# Patient Record
Sex: Female | Born: 1975
Health system: Southern US, Community
[De-identification: ages and names within clinical notes are randomized; demographics above are authoritative.]

## PROBLEM LIST (undated history)

## (undated) DIAGNOSIS — R011 Cardiac murmur, unspecified: Secondary | ICD-10-CM

## (undated) DIAGNOSIS — M1990 Unspecified osteoarthritis, unspecified site: Secondary | ICD-10-CM

## (undated) DIAGNOSIS — F32A Depression, unspecified: Secondary | ICD-10-CM

## (undated) DIAGNOSIS — D649 Anemia, unspecified: Secondary | ICD-10-CM

## (undated) DIAGNOSIS — F1010 Alcohol abuse, uncomplicated: Secondary | ICD-10-CM

## (undated) DIAGNOSIS — E039 Hypothyroidism, unspecified: Secondary | ICD-10-CM

## (undated) DIAGNOSIS — K219 Gastro-esophageal reflux disease without esophagitis: Secondary | ICD-10-CM

## (undated) DIAGNOSIS — F319 Bipolar disorder, unspecified: Secondary | ICD-10-CM

## (undated) DIAGNOSIS — I10 Essential (primary) hypertension: Secondary | ICD-10-CM

## (undated) DIAGNOSIS — E559 Vitamin D deficiency, unspecified: Secondary | ICD-10-CM

## (undated) DIAGNOSIS — M797 Fibromyalgia: Secondary | ICD-10-CM

## (undated) DIAGNOSIS — IMO0002 Reserved for concepts with insufficient information to code with codable children: Secondary | ICD-10-CM

## (undated) DIAGNOSIS — J45909 Unspecified asthma, uncomplicated: Secondary | ICD-10-CM

## (undated) DIAGNOSIS — F419 Anxiety disorder, unspecified: Secondary | ICD-10-CM

## (undated) DIAGNOSIS — T7840XA Allergy, unspecified, initial encounter: Secondary | ICD-10-CM

## (undated) DIAGNOSIS — E669 Obesity, unspecified: Secondary | ICD-10-CM

## (undated) DIAGNOSIS — R7303 Prediabetes: Secondary | ICD-10-CM

## (undated) DIAGNOSIS — E119 Type 2 diabetes mellitus without complications: Secondary | ICD-10-CM

## (undated) DIAGNOSIS — K5900 Constipation, unspecified: Secondary | ICD-10-CM

## (undated) DIAGNOSIS — E079 Disorder of thyroid, unspecified: Secondary | ICD-10-CM

## (undated) DIAGNOSIS — F329 Major depressive disorder, single episode, unspecified: Secondary | ICD-10-CM

## (undated) HISTORY — DX: Disorder of thyroid, unspecified: E07.9

## (undated) HISTORY — DX: Allergy, unspecified, initial encounter: T78.40XA

## (undated) HISTORY — DX: Anxiety disorder, unspecified: F41.9

## (undated) HISTORY — DX: Anemia, unspecified: D64.9

## (undated) HISTORY — DX: Constipation, unspecified: K59.00

## (undated) HISTORY — DX: Alcohol abuse, uncomplicated: F10.10

## (undated) HISTORY — DX: Reserved for concepts with insufficient information to code with codable children: IMO0002

## (undated) HISTORY — PX: CARDIAC CATHETERIZATION: SHX172

## (undated) HISTORY — DX: Prediabetes: R73.03

## (undated) HISTORY — PX: TUBAL LIGATION: SHX77

## (undated) HISTORY — DX: Depression, unspecified: F32.A

## (undated) HISTORY — DX: Unspecified osteoarthritis, unspecified site: M19.90

## (undated) HISTORY — DX: Obesity, unspecified: E66.9

## (undated) HISTORY — DX: Vitamin D deficiency, unspecified: E55.9

## (undated) HISTORY — DX: Bipolar disorder, unspecified: F31.9

## (undated) HISTORY — DX: Fibromyalgia: M79.7

## (undated) HISTORY — DX: Essential (primary) hypertension: I10

## (undated) HISTORY — DX: Hypothyroidism, unspecified: E03.9

## (undated) HISTORY — DX: Cardiac murmur, unspecified: R01.1

---
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
## 2008-01-27 ENCOUNTER — Emergency Department (HOSPITAL_BASED_OUTPATIENT_CLINIC_OR_DEPARTMENT_OTHER): Admission: EM | Admit: 2008-01-27 | Discharge: 2008-01-27 | Payer: Self-pay | Admitting: Emergency Medicine

## 2008-10-11 ENCOUNTER — Encounter: Payer: Self-pay | Admitting: Emergency Medicine

## 2008-10-11 ENCOUNTER — Ambulatory Visit: Payer: Self-pay | Admitting: Diagnostic Radiology

## 2008-10-11 ENCOUNTER — Inpatient Hospital Stay (HOSPITAL_COMMUNITY): Admission: EM | Admit: 2008-10-11 | Discharge: 2008-10-12 | Payer: Self-pay | Admitting: Cardiology

## 2008-10-12 ENCOUNTER — Encounter (INDEPENDENT_AMBULATORY_CARE_PROVIDER_SITE_OTHER): Payer: Self-pay | Admitting: Cardiology

## 2009-03-16 ENCOUNTER — Ambulatory Visit: Payer: Self-pay | Admitting: Radiology

## 2009-03-16 ENCOUNTER — Emergency Department (HOSPITAL_BASED_OUTPATIENT_CLINIC_OR_DEPARTMENT_OTHER): Admission: EM | Admit: 2009-03-16 | Discharge: 2009-03-16 | Payer: Self-pay | Admitting: Emergency Medicine

## 2009-06-12 ENCOUNTER — Emergency Department (HOSPITAL_BASED_OUTPATIENT_CLINIC_OR_DEPARTMENT_OTHER): Admission: EM | Admit: 2009-06-12 | Discharge: 2009-06-12 | Payer: Self-pay | Admitting: Emergency Medicine

## 2010-08-02 LAB — PREGNANCY, URINE: Preg Test, Ur: NEGATIVE

## 2010-08-07 LAB — DIFFERENTIAL
Basophils Absolute: 0 10*3/uL (ref 0.0–0.1)
Basophils Relative: 1 % (ref 0–1)
Eosinophils Absolute: 0.3 10*3/uL (ref 0.0–0.7)
Eosinophils Relative: 6 % — ABNORMAL HIGH (ref 0–5)
Lymphocytes Relative: 41 % (ref 12–46)
Lymphs Abs: 1.8 10*3/uL (ref 0.7–4.0)
Monocytes Absolute: 0.4 10*3/uL (ref 0.1–1.0)
Monocytes Relative: 10 % (ref 3–12)
Neutro Abs: 1.8 10*3/uL (ref 1.7–7.7)
Neutrophils Relative %: 43 % (ref 43–77)

## 2010-08-07 LAB — COMPREHENSIVE METABOLIC PANEL
ALT: 10 U/L (ref 0–35)
AST: 18 U/L (ref 0–37)
Albumin: 3.2 g/dL — ABNORMAL LOW (ref 3.5–5.2)
Alkaline Phosphatase: 53 U/L (ref 39–117)
BUN: 6 mg/dL (ref 6–23)
CO2: 25 mEq/L (ref 19–32)
Calcium: 8.7 mg/dL (ref 8.4–10.5)
Chloride: 110 mEq/L (ref 96–112)
Creatinine, Ser: 0.71 mg/dL (ref 0.4–1.2)
GFR calc Af Amer: >60 mL/min (ref >60)
GFR calc non Af Amer: >60 mL/min (ref >60)
Glucose, Bld: 101 mg/dL — ABNORMAL HIGH (ref 70–99)
Potassium: 4 mEq/L (ref 3.5–5.1)
Sodium: 140 mEq/L (ref 135–145)
Total Bilirubin: 0.2 mg/dL — ABNORMAL LOW (ref 0.3–1.2)
Total Protein: 6.4 g/dL (ref 6.0–8.3)

## 2010-08-07 LAB — BASIC METABOLIC PANEL
BUN: 10 mg/dL (ref 6–23)
BUN: 8 mg/dL (ref 6–23)
CO2: 27 mEq/L (ref 19–32)
CO2: 26 mEq/L (ref 19–32)
Calcium: 8.8 mg/dL (ref 8.4–10.5)
Calcium: 8.5 mg/dL (ref 8.4–10.5)
Chloride: 107 mEq/L (ref 96–112)
Chloride: 110 mEq/L (ref 96–112)
Creatinine, Ser: 0.8 mg/dL (ref 0.4–1.2)
Creatinine, Ser: 0.66 mg/dL (ref 0.4–1.2)
GFR calc Af Amer: >60 mL/min (ref >60)
GFR calc Af Amer: >60 mL/min (ref >60)
GFR calc non Af Amer: >60 mL/min (ref >60)
GFR calc non Af Amer: >60 mL/min (ref >60)
Glucose, Bld: 94 mg/dL (ref 70–99)
Glucose, Bld: 105 mg/dL — ABNORMAL HIGH (ref 70–99)
Potassium: 4 mEq/L (ref 3.5–5.1)
Potassium: 4 mEq/L (ref 3.5–5.1)
Sodium: 143 mEq/L (ref 135–145)
Sodium: 140 mEq/L (ref 135–145)

## 2010-08-07 LAB — PROTIME-INR
INR: 1 (ref 0.00–1.49)
Prothrombin Time: 13.4 seconds (ref 11.6–15.2)

## 2010-08-07 LAB — CBC
HCT: 29.7 % — ABNORMAL LOW (ref 36.0–46.0)
HCT: 27.3 % — ABNORMAL LOW (ref 36.0–46.0)
Hemoglobin: 9.7 g/dL — ABNORMAL LOW (ref 12.0–15.0)
Hemoglobin: 9.2 g/dL — ABNORMAL LOW (ref 12.0–15.0)
MCHC: 32.7 g/dL (ref 30.0–36.0)
MCHC: 33.7 g/dL (ref 30.0–36.0)
MCV: 83.8 fL (ref 78.0–100.0)
MCV: 82.8 fL (ref 78.0–100.0)
Platelets: 324 10*3/uL (ref 150–400)
Platelets: 279 10*3/uL (ref 150–400)
RBC: 3.54 MIL/uL — ABNORMAL LOW (ref 3.87–5.11)
RBC: 3.3 MIL/uL — ABNORMAL LOW (ref 3.87–5.11)
RDW: 13.1 % (ref 11.5–15.5)
RDW: 13.6 % (ref 11.5–15.5)
WBC: 4.3 10*3/uL (ref 4.0–10.5)
WBC: 4.1 10*3/uL (ref 4.0–10.5)

## 2010-08-07 LAB — LIPID PANEL
Cholesterol: 156 mg/dL (ref 0–200)
HDL: 29 mg/dL — ABNORMAL LOW (ref >39)
LDL Cholesterol: 107 mg/dL — ABNORMAL HIGH (ref 0–99)
Total CHOL/HDL Ratio: 5.4 RATIO
Triglycerides: 101 mg/dL (ref <150)
VLDL: 20 mg/dL (ref 0–40)

## 2010-08-07 LAB — D-DIMER, QUANTITATIVE (NOT AT ARMC): D-Dimer, Quant: 0.28 ug/mL-FEU (ref 0.00–0.48)

## 2010-08-07 LAB — HEMOGLOBIN AND HEMATOCRIT, BLOOD
HCT: 29 % — ABNORMAL LOW (ref 36.0–46.0)
Hemoglobin: 9.6 g/dL — ABNORMAL LOW (ref 12.0–15.0)

## 2010-08-07 LAB — PREGNANCY, URINE: Preg Test, Ur: NEGATIVE

## 2010-08-07 LAB — POCT CARDIAC MARKERS
CKMB, poc: <1 ng/mL — ABNORMAL LOW (ref 1.0–8.0)
Myoglobin, poc: 39.2 ng/mL (ref 12–200)
Troponin i, poc: <0.05 ng/mL (ref 0.00–0.09)

## 2010-08-07 LAB — TSH: TSH: 1.892 u[IU]/mL (ref 0.350–4.500)

## 2010-08-07 LAB — HEMOGLOBIN A1C
Hgb A1c MFr Bld: 6.1 % (ref 4.6–6.1)
Mean Plasma Glucose: 128 mg/dL

## 2010-08-07 LAB — BRAIN NATRIURETIC PEPTIDE: Pro B Natriuretic peptide (BNP): 39 pg/mL (ref 0.0–100.0)

## 2010-08-07 LAB — MAGNESIUM: Magnesium: 1.9 mg/dL (ref 1.5–2.5)

## 2010-09-12 NOTE — Discharge Summary (Signed)
Tracy Valenzuela, Tracy Valenzuela NO.:  [id number]   MEDICAL RECORD NO.:  [id number]          PATIENT TYPE:  INP   LOCATION:  2502                         FACILITY:  [facility]   PHYSICIAN:  Cristy Hilts. Jacinto Halim, MD       DATE OF BIRTH:  06/11/1975   DATE OF ADMISSION:  10/11/2008  DATE OF DISCHARGE:  10/12/2008                               DISCHARGE SUMMARY   DISCHARGE DIAGNOSES:  1. Chest pain worrisome for unstable angina, catheterization this      admission revealing normal left ventricular function, normal      coronary arteries.  2. Bipolar disorder.  3. Abnormal EKG.   HOSPITAL COURSE:  The patient is a 35 year old female, who presented to  the emergency room in [place] at [facility] with chest pain and EKG changes  with T-wave inversion in lead III.  She also had some minimal ST  elevation in lead V3, and then she was treated initially as an STEMI.  She came to the ER [facility], and was evaluated, and taken to the cath lab  for urgent catheterization.  This revealed normal coronaries and normal  LV function.  There is no renal artery stenosis and no abdominal aortic  aneurysm.  D-dimer was within normal limits.  Echocardiogram has been  obtained and results are pending at the time of this dictation.  She was  discharged home on June 15 on Prilosec OTC 20 mg a day.   LABORATORIES:  White count 4.1, hemoglobin 9.2, hematocrit 27.3, and  platelets 279.  Sodium 140, potassium 4.0, BUN 8, and creatinine 0.6.  Liver functions are normal.  BNP was within normal limits.  Urine  pregnancy was negative.  TSH 1.89.  Lipid panel shows an LDL of 107, HDL  29, and total cholesterol of 156.  Hemoglobin A1c 6.1.  She will follow  up with Dr. Reche Dixon at [facility], she has appointment on July 16.   DISPOSITION:  The patient is discharged in stable condition.  She will  need follow up of her anemia, which will be done by her primary care  doctor.  Addendum to the above discharge summary, the patient  was seen  by [facility] during this admission.      Abelino Derrick, P.A.      Cristy Hilts. Jacinto Halim, MD     LKK/MEDQ  D:  11/04/2008  T:  11/05/2008  Job:  [id number]   cc:   Dineen Kid. Reche Dixon, M.D.

## 2010-09-12 NOTE — Cardiovascular Report (Signed)
[name], Valenzuela NO.:  [id number]   MEDICAL RECORD NO.:  [id number]          PATIENT TYPE:  INP   LOCATION:  2502                         FACILITY:  [facility]   PHYSICIAN:  Cristy Hilts. Jacinto Halim, MD       DATE OF BIRTH:  Jan 28, 1976   DATE OF PROCEDURE:  10/11/2008  DATE OF DISCHARGE:                            CARDIAC CATHETERIZATION   PROCEDURES PERFORMED:  1. Left ventriculography.  2. Selective right and left coronary arteriography.  3. Ascending aortogram.  4. Abdominal aortogram.  5. Right femoral arteriography and closure of the right femoral      arterial access with StarClose.   INDICATIONS:  [name] is a 35-year-old female with history of  bipolar disorder, morbid obesity who [facility].  She had initially complained  of 10/10 chest discomfort associated with marked shortness of breath.  Chest pain was relieved with sublingual nitroglycerin.  She has a strong  family history of premature coronary artery disease.  Given her morbid  obesity, abnormal EKG which suggested ST elevation in I and aVL with  inferior ST depression.  She was rushed to the cardiac catheterization  lab on an emergent basis for cardiac catheterization for coronary artery  disease evaluation.   The ascending aortogram and abdominal aortogram was performed to  evaluate for ascending aortic dissection and aneurysm given abnormal EKG  and chest pain.   HEMODYNAMIC DATA:  The left ventricular pressure was 122/4 with an end-  diastolic pressure of 17 mmHg.  The aortic pressure was 116/78 with a  mean of 97 milliseconds.  There was no significant pressure gradient  across the aortic valve.   ANGIOGRAPHIC DATA:  Left ventricle:  Left ventricular systolic function  was normal with an ejection fraction of 65% without any significant  mitral regurgitation.  No regional wall motion abnormality was noted.   Right coronary  artery:  Right coronary artery is a large-caliber  dominant vessel.  Gives origin to large PDA and large PLA.  It is smooth  and normal.   Left main coronary artery:  Left main coronary artery is a large-caliber  vessel, it is smooth and normal.   Circumflex coronary artery:  Circumflex coronary artery is a moderate-to-  caliber vessel which is smooth and normal.   Ramus intermediate:  Ramus intermediate is a large caliber vessel.  It  gives origin to several obtuse marginal branches.  It is smooth and  normal.   LAD:  LAD is a large caliber vessel.  It gives origin to large diagonal  I and a moderate-sized diagonal II.  It is smooth and normal.   Ascending aortogram:  Ascending aortogram revealed presence of 3 aortic  valve cusps without evidence of aortic regurgitation without evidence of  ascending aortic aneurysm or dissection.   Abdominal aortogram:  Abdominal aortogram revealed presence of 2 renal  arteries, one on either sides.  They were widely patent.  There was no  evidence of abdominal aortic aneurysm.  Aortoiliac bifurcation was  widely patent.  IMPRESSION:  Abnormal EKG probably related to either hypertension with  hypertensive heart disease or early repolarization and morbid obesity  contributing to this.  We will check a D-dimer on her.  If the D-dimer  is positive, she will have a CT angiography of the chest to exclude  pulmonary embolism given abnormal EKG, marked shortness of breath with  normal-looking chest x-ray.   Total of 110 mL of contrast was utilized for diagnostic angiography.   TECHNIQUE OF THE PROCEDURE:  Under usual sterile precautions, using a 6-  French right femoral arterial access, a 6-French multipurpose A2  catheter was advanced into the ascending aorta, then into the left  ventricle.  Left ventriculography was performed both in LAO and RAO  projection.  Catheter pulled into the ascending aorta.  Right coronary  artery was selectively  engaged and angiography was performed and then  left main coronary artery was selectively engaged and angiography was  performed.  Catheter was pulled into the root of the aorta and the  ascending aortogram was performed in the LAO projection, then catheter  was pulled back into the abdominal aorta, abdominal aortogram in the AP  projection.  Catheter was pulled out of the body.  Right femoral  arteriography was performed through the arterial access sheath and  access closed with StarClose with good hemostasis.  The patient  tolerated the procedure were.  There were no immediate complications.      Cristy Hilts. Jacinto Halim, MD     JRG/MEDQ  D:  10/11/2008  T:  10/12/2008  Job:  [id number]

## 2010-12-31 ENCOUNTER — Emergency Department (INDEPENDENT_AMBULATORY_CARE_PROVIDER_SITE_OTHER): Payer: BC Managed Care – PPO

## 2010-12-31 ENCOUNTER — Emergency Department (HOSPITAL_BASED_OUTPATIENT_CLINIC_OR_DEPARTMENT_OTHER)
Admission: EM | Admit: 2010-12-31 | Discharge: 2010-12-31 | Disposition: A | Discharge status: Home / Self Care | Payer: BC Managed Care – PPO | Attending: Emergency Medicine | Admitting: Emergency Medicine

## 2010-12-31 ENCOUNTER — Encounter: Payer: Self-pay | Admitting: *Deleted

## 2010-12-31 DIAGNOSIS — R51 Headache: Secondary | ICD-10-CM | POA: Insufficient documentation

## 2010-12-31 DIAGNOSIS — IMO0002 Reserved for concepts with insufficient information to code with codable children: Secondary | ICD-10-CM | POA: Insufficient documentation

## 2010-12-31 DIAGNOSIS — S60219A Contusion of unspecified wrist, initial encounter: Secondary | ICD-10-CM | POA: Insufficient documentation

## 2010-12-31 DIAGNOSIS — S0003XA Contusion of scalp, initial encounter: Secondary | ICD-10-CM | POA: Insufficient documentation

## 2010-12-31 DIAGNOSIS — S0093XA Contusion of unspecified part of head, initial encounter: Secondary | ICD-10-CM

## 2010-12-31 DIAGNOSIS — M25539 Pain in unspecified wrist: Secondary | ICD-10-CM

## 2010-12-31 MED ORDER — IBUPROFEN 600 MG PO TABS: 600.0000 mg | ORAL_TABLET | Freq: Four times a day (QID) | ORAL | Status: AC | PRN

## 2010-12-31 MED ORDER — HYDROCODONE-ACETAMINOPHEN 5-325 MG PO TABS
2.0000 | ORAL_TABLET | Freq: Once | ORAL | Status: AC
  Administered 2010-12-31: 2 via ORAL
  Filled 2010-12-31: qty 2

## 2010-12-31 MED ORDER — HYDROCODONE-ACETAMINOPHEN 5-500 MG PO TABS: 1.0000 | ORAL_TABLET | Freq: Four times a day (QID) | ORAL | Status: AC | PRN
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
## 2011-01-29 LAB — RAPID STREP SCREEN (MED CTR MEBANE ONLY): Streptococcus, Group A Screen (Direct): NEGATIVE

## 2011-03-21 ENCOUNTER — Emergency Department (HOSPITAL_BASED_OUTPATIENT_CLINIC_OR_DEPARTMENT_OTHER)
Admission: EM | Admit: 2011-03-21 | Discharge: 2011-03-22 | Disposition: A | Discharge status: Home / Self Care | Payer: BC Managed Care – PPO | Attending: Emergency Medicine | Admitting: Emergency Medicine

## 2011-03-21 ENCOUNTER — Encounter (HOSPITAL_BASED_OUTPATIENT_CLINIC_OR_DEPARTMENT_OTHER): Payer: Self-pay | Admitting: *Deleted

## 2011-03-21 DIAGNOSIS — R109 Unspecified abdominal pain: Secondary | ICD-10-CM | POA: Insufficient documentation

## 2011-03-21 DIAGNOSIS — N390 Urinary tract infection, site not specified: Secondary | ICD-10-CM | POA: Insufficient documentation

## 2011-03-21 LAB — URINALYSIS, ROUTINE W REFLEX MICROSCOPIC
Bilirubin Urine: NEGATIVE
Glucose, UA: NEGATIVE mg/dL
Ketones, ur: NEGATIVE mg/dL
Nitrite: NEGATIVE
Protein, ur: 30 mg/dL — AB
Specific Gravity, Urine: 1.027 (ref 1.005–1.030)
Urobilinogen, UA: 0.2 mg/dL (ref 0.0–1.0)
pH: 6 (ref 5.0–8.0)

## 2011-03-21 LAB — URINE MICROSCOPIC-ADD ON

## 2011-03-21 LAB — PREGNANCY, URINE: Preg Test, Ur: NEGATIVE

## 2011-03-21 MED ORDER — PHENAZOPYRIDINE HCL 100 MG PO TABS
ORAL_TABLET | ORAL | Status: AC
  Filled 2011-03-21: qty 2

## 2011-03-21 MED ORDER — PHENAZOPYRIDINE HCL 200 MG PO TABS: 200.0000 mg | ORAL_TABLET | Freq: Three times a day (TID) | ORAL | Status: AC

## 2011-03-21 MED ORDER — SULFAMETHOXAZOLE-TRIMETHOPRIM 800-160 MG PO TABS: 1.0000 | ORAL_TABLET | Freq: Two times a day (BID) | ORAL | Status: AC

## 2011-03-21 MED ORDER — PHENAZOPYRIDINE HCL 100 MG PO TABS
200.0000 mg | ORAL_TABLET | Freq: Once | ORAL | Status: AC
  Administered 2011-03-21: 200 mg via ORAL

## 2011-03-21 MED ORDER — SULFAMETHOXAZOLE-TMP DS 800-160 MG PO TABS
1.0000 | ORAL_TABLET | Freq: Once | ORAL | Status: AC
  Administered 2011-03-21: 1 via ORAL
  Filled 2011-03-21: qty 1

## 2011-03-21 NOTE — ED Notes (Signed)
C/o dysuria and hematuria since last night.

## 2011-03-21 NOTE — ED Notes (Signed)
Pt c/o lower abdominal pain radiating to her back and pink discharge that began last night at around midnight.

## 2011-03-22 NOTE — ED Provider Notes (Signed)
History     CSN: [id number] Arrival date & time: 03/21/2011  9:41 PM   First MD Initiated Contact with Patient 03/21/11 2314      Chief Complaint  Patient presents with  . Abdominal Pain    (Consider location/radiation/quality/duration/timing/severity/associated sxs/prior treatment) Patient is a 35 y.o. female presenting with abdominal pain. The history is provided by the patient.  Abdominal Pain The primary symptoms of the illness include abdominal pain and dysuria. The primary symptoms of the illness do not include shortness of breath, nausea, vomiting, vaginal discharge or vaginal bleeding. The current episode started 2 days ago. The onset of the illness was gradual. The problem has been gradually worsening.  The abdominal pain began 2 days ago. The pain came on gradually. The abdominal pain has been unchanged since its onset. The abdominal pain is located in the suprapubic region. The abdominal pain radiates to the left flank and right flank. The severity of the abdominal pain is 3/10. The abdominal pain is relieved by nothing. The abdominal pain is exacerbated by urination.  The dysuria began 2 days ago. The discomfort is moderate. She is not currently sexually active. The dysuria is associated with frequency and urgency. The dysuria is not associated with discharge or hematuria.  The patient states that she believes she is currently not pregnant. The patient has not had a change in bowel habit. Additional symptoms associated with the illness include urgency and frequency. Symptoms associated with the illness do not include hematuria.    History reviewed. No pertinent past medical history.  Past Surgical History  Procedure Date  . Tubal ligation   . Cardiac catheterization     No family history on file.  History  Substance Use Topics  . Smoking status: Never Smoker   . Smokeless tobacco: Not on file  . Alcohol Use: No    OB History    Grav Para Term Preterm Abortions TAB  SAB Ect Mult Living                  Review of Systems  Respiratory: Negative for shortness of breath.   Gastrointestinal: Positive for abdominal pain. Negative for nausea and vomiting.  Genitourinary: Positive for dysuria, urgency and frequency. Negative for hematuria, vaginal bleeding and vaginal discharge.  All other systems reviewed and are negative.    Allergies  Review of patient's allergies indicates no known allergies.  Home Medications   Current Outpatient Rx  Name Route Sig Dispense Refill  . PHENAZOPYRIDINE HCL 200 MG PO TABS Oral Take 1 tablet (200 mg total) by mouth 3 (three) times daily. 6 tablet 0  . SULFAMETHOXAZOLE-TRIMETHOPRIM 800-160 MG PO TABS Oral Take 1 tablet by mouth 2 (two) times daily. 14 tablet 0    BP 123/49  Pulse 87  Temp(Src) 98.5 F (36.9 C) (Oral)  Resp 20  Ht 5\' 8"  (1.727 m)  Wt 240 lb (108.863 kg)  BMI 36.49 kg/m2  SpO2 100%  LMP 03/12/2011  Physical Exam  Nursing note and vitals reviewed. Constitutional: She is oriented to person, place, and time. She appears well-developed and well-nourished. No distress.  HENT:  Head: Normocephalic and atraumatic.  Eyes: EOM are normal. Pupils are equal, round, and reactive to light.  Cardiovascular: Normal rate, regular rhythm, normal heart sounds and intact distal pulses.  Exam reveals no friction rub.   No murmur heard. Pulmonary/Chest: Effort normal and breath sounds normal. She has no wheezes. She has no rales.  Abdominal: Soft. Bowel sounds are normal.  She exhibits no distension. There is tenderness in the suprapubic area. There is CVA tenderness. There is no rebound and no guarding.  Musculoskeletal: Normal range of motion. She exhibits no tenderness.       No edema  Neurological: She is alert and oriented to person, place, and time. No cranial nerve deficit.  Skin: Skin is warm and dry. No rash noted.  Psychiatric: She has a normal mood and affect. Her behavior is normal.    ED Course    Procedures (including critical care time)  Labs Reviewed  URINALYSIS, ROUTINE W REFLEX MICROSCOPIC - Abnormal; Notable for the following:    Appearance CLOUDY (*)    Hgb urine dipstick LARGE (*)    Protein, ur 30 (*)    Leukocytes, UA LARGE (*)    All other components within normal limits  URINE MICROSCOPIC-ADD ON - Abnormal; Notable for the following:    Squamous Epithelial / LPF FEW (*)    Bacteria, UA FEW (*)    All other components within normal limits  PREGNANCY, URINE   No results found.   1. UTI (lower urinary tract infection)       MDM   Pt with dysuria and back pain. Denies N/V or fever.  No signs of pyelo.  UTI on UA and denies any vaginal d/c.  Will treat and d/c home.        Gwyneth Sprout, MD 03/22/11 [phone]

## 2011-04-09 ENCOUNTER — Encounter (HOSPITAL_BASED_OUTPATIENT_CLINIC_OR_DEPARTMENT_OTHER): Payer: Self-pay | Admitting: *Deleted

## 2011-04-09 ENCOUNTER — Emergency Department (HOSPITAL_BASED_OUTPATIENT_CLINIC_OR_DEPARTMENT_OTHER)
Admission: EM | Admit: 2011-04-09 | Discharge: 2011-04-09 | Disposition: A | Discharge status: Home / Self Care | Payer: BC Managed Care – PPO | Attending: Emergency Medicine | Admitting: Emergency Medicine

## 2011-04-09 DIAGNOSIS — J111 Influenza due to unidentified influenza virus with other respiratory manifestations: Secondary | ICD-10-CM

## 2011-04-09 DIAGNOSIS — B9789 Other viral agents as the cause of diseases classified elsewhere: Secondary | ICD-10-CM | POA: Insufficient documentation

## 2011-04-09 NOTE — ED Notes (Signed)
Cough runny nose sore throat fever since last night.

## 2011-04-09 NOTE — ED Provider Notes (Addendum)
History     CSN: [id number] Arrival date & time: 04/09/2011  5:36 PM   First MD Initiated Contact with Patient 04/09/11 1743      Chief Complaint  Patient presents with  . Sore Throat    (Consider location/radiation/quality/duration/timing/severity/associated sxs/prior treatment) HPI 35 y.o. Female here with ili for two days.  Children with same.  Patient with subjective fever, uri symptoms, muscle aches.  No vomiting or diarrhea.   History reviewed. No pertinent past medical history.  Past Surgical History  Procedure Date  . Tubal ligation   . Cardiac catheterization     No family history on file.  History  Substance Use Topics  . Smoking status: Never Smoker   . Smokeless tobacco: Not on file  . Alcohol Use: No    OB History    Grav Para Term Preterm Abortions TAB SAB Ect Mult Living                  Review of Systems  All other systems reviewed and are negative.    Allergies  Review of patient's allergies indicates no known allergies.  Home Medications  No current outpatient prescriptions on file.  BP 119/64  Pulse 78  Temp(Src) 97 F (36.1 C) (Oral)  Resp 20  SpO2 97%  LMP 03/12/2011  Physical Exam  Nursing note and vitals reviewed. Constitutional: She is oriented to person, place, and time. She appears well-developed and well-nourished.  HENT:  Head: Normocephalic and atraumatic.  Right Ear: External ear normal.  Left Ear: External ear normal.  Nose: Nose normal.  Mouth/Throat: Oropharynx is clear and moist.  Eyes: Conjunctivae and EOM are normal. Pupils are equal, round, and reactive to light.  Neck: Normal range of motion. Neck supple.  Cardiovascular: Normal rate, regular rhythm and normal heart sounds.   Pulmonary/Chest: Effort normal and breath sounds normal.  Abdominal: Soft. Bowel sounds are normal.  Musculoskeletal: Normal range of motion.  Neurological: She is alert and oriented to person, place, and time. She has normal  reflexes.  Skin: Skin is warm and dry.  Psychiatric: She has a normal mood and affect. Her behavior is normal. Judgment and thought content normal.    ED Course  Procedures (including critical care time)  Labs Reviewed - No data to display No results found.   No diagnosis found.    MDM          Hilario Quarry, MD 04/09/11 4098  Hilario Quarry, MD 04/09/11 [phone]

## 2011-11-08 ENCOUNTER — Encounter (HOSPITAL_BASED_OUTPATIENT_CLINIC_OR_DEPARTMENT_OTHER): Payer: Self-pay

## 2011-11-08 ENCOUNTER — Emergency Department (HOSPITAL_BASED_OUTPATIENT_CLINIC_OR_DEPARTMENT_OTHER)
Admission: EM | Admit: 2011-11-08 | Discharge: 2011-11-08 | Disposition: A | Discharge status: Home / Self Care | Payer: Self-pay | Attending: Emergency Medicine | Admitting: Emergency Medicine

## 2011-11-08 DIAGNOSIS — A599 Trichomoniasis, unspecified: Secondary | ICD-10-CM | POA: Insufficient documentation

## 2011-11-08 DIAGNOSIS — J45909 Unspecified asthma, uncomplicated: Secondary | ICD-10-CM | POA: Insufficient documentation

## 2011-11-08 DIAGNOSIS — L089 Local infection of the skin and subcutaneous tissue, unspecified: Secondary | ICD-10-CM | POA: Insufficient documentation

## 2011-11-08 HISTORY — DX: Unspecified asthma, uncomplicated: J45.909

## 2011-11-08 LAB — WET PREP, GENITAL: Yeast Wet Prep HPF POC: NONE SEEN

## 2011-11-08 MED ORDER — METRONIDAZOLE 500 MG PO TABS: 500.0000 mg | ORAL_TABLET | Freq: Two times a day (BID) | ORAL | Status: AC

## 2011-11-08 MED ORDER — DOXYCYCLINE HYCLATE 100 MG PO CAPS: 100.0000 mg | ORAL_CAPSULE | Freq: Two times a day (BID) | ORAL | Status: AC

## 2011-11-08 NOTE — ED Notes (Signed)
Pt reports a rash x 3 weeks and recently treated for scabies.

## 2011-11-08 NOTE — ED Provider Notes (Signed)
History     CSN: [id number]  Arrival date & time 11/08/11  1624   First MD Initiated Contact with Patient 11/08/11 1849      Chief Complaint  Patient presents with  . Rash    (Consider location/radiation/quality/duration/timing/severity/associated sxs/prior treatment) Patient is a 36 y.o. female presenting with vaginal discharge. The history is provided by the patient. No language interpreter was used.  Vaginal Discharge This is a new problem. The current episode started in the past 7 days. The problem occurs constantly. The problem has been gradually worsening. Associated symptoms include a rash. Nothing aggravates the symptoms. She has tried nothing for the symptoms. The treatment provided no relief.  Pt complains of a vaginal discharge and odor.  Pt reports she was treated for scabies 3 weeks ago. Pt complains of a continued skin rash.  Past Medical History  Diagnosis Date  . Asthma     Past Surgical History  Procedure Date  . Tubal ligation   . Cardiac catheterization     No family history on file.  History  Substance Use Topics  . Smoking status: Never Smoker   . Smokeless tobacco: Not on file  . Alcohol Use: No    OB History    Grav Para Term Preterm Abortions TAB SAB Ect Mult Living                  Review of Systems  Genitourinary: Positive for vaginal discharge.  Skin: Positive for rash.  All other systems reviewed and are negative.    Allergies  Review of patient's allergies indicates no known allergies.  Home Medications  No current outpatient prescriptions on file.  BP 137/68  Pulse 81  Temp 98 F (36.7 C) (Oral)  Resp 18  SpO2 100%  LMP 10/28/2011  Physical Exam  Nursing note and vitals reviewed. Constitutional: She is oriented to person, place, and time. She appears well-developed and well-nourished.  HENT:  Head: Normocephalic and atraumatic.  Right Ear: External ear normal.  Left Ear: External ear normal.  Nose: Nose normal.    Mouth/Throat: Oropharynx is clear and moist.  Eyes: Conjunctivae are normal. Pupils are equal, round, and reactive to light.  Neck: Normal range of motion. Neck supple.  Cardiovascular: Normal rate and normal heart sounds.   Pulmonary/Chest: Effort normal and breath sounds normal.  Abdominal: Soft.  Genitourinary: Vaginal discharge found.       Clear discharge, adnexa nontender,  No mass  Musculoskeletal: Normal range of motion.  Neurological: She is alert and oriented to person, place, and time. She has normal reflexes.  Skin: There is erythema.       Erythematous sores, looks infected   Psychiatric: She has a normal mood and affect.    ED Course  Procedures (including critical care time)  Labs Reviewed  WET PREP, GENITAL - Abnormal; Notable for the following:    Trich, Wet Prep MODERATE (*)     Clue Cells Wet Prep HPF POC FEW (*)     WBC, Wet Prep HPF POC MODERATE (*)     All other components within normal limits  GC/CHLAMYDIA PROBE AMP, GENITAL   No results found.   1. Trichomonas   2. Skin infection       MDM  Flagyl  And doxycycline for skin        Lonia Skinner [place], [place] 11/08/11 1941

## 2011-11-10 LAB — GC/CHLAMYDIA PROBE AMP, GENITAL
Chlamydia, DNA Probe: POSITIVE — AB
GC Probe Amp, Genital: POSITIVE — AB

## 2011-11-10 NOTE — ED Provider Notes (Signed)
Medical screening examination/treatment/procedure(s) were performed by non-physician practitioner and as supervising physician I was immediately available for consultation/collaboration.   Shelda Jakes, MD 11/10/11 [phone]

## 2011-11-11 NOTE — ED Notes (Signed)
+   Gonorrhea + Chlamydia Chart sent to EDP office for review. 

## 2011-11-14 NOTE — ED Notes (Signed)
Check with patient to see which is best : needs to return with rocephin/zithromax alternatively could be txed at home with rx for zithromax 2 po once (250 mg tabs x 8)but most people have a hard time tolerating this per Grant Fontana.

## 2011-11-15 NOTE — ED Notes (Signed)
Attempt made to contact patient-no answer 

## 2011-11-17 NOTE — ED Notes (Signed)
Left voicemail to call flow manager's #

## 2011-11-19 NOTE — ED Notes (Signed)
Unable to contact via phone.'Letter sent to [facility] address. 

## 2012-11-10 ENCOUNTER — Encounter (HOSPITAL_BASED_OUTPATIENT_CLINIC_OR_DEPARTMENT_OTHER): Payer: Self-pay | Admitting: Family Medicine

## 2012-11-10 ENCOUNTER — Emergency Department (HOSPITAL_BASED_OUTPATIENT_CLINIC_OR_DEPARTMENT_OTHER)
Admission: EM | Admit: 2012-11-10 | Discharge: 2012-11-10 | Disposition: A | Discharge status: Home / Self Care | Payer: Self-pay | Attending: Emergency Medicine | Admitting: Emergency Medicine

## 2012-11-10 ENCOUNTER — Emergency Department (HOSPITAL_BASED_OUTPATIENT_CLINIC_OR_DEPARTMENT_OTHER): Payer: Self-pay

## 2012-11-10 DIAGNOSIS — M25579 Pain in unspecified ankle and joints of unspecified foot: Secondary | ICD-10-CM | POA: Insufficient documentation

## 2012-11-10 DIAGNOSIS — J45909 Unspecified asthma, uncomplicated: Secondary | ICD-10-CM | POA: Insufficient documentation

## 2012-11-10 DIAGNOSIS — F17200 Nicotine dependence, unspecified, uncomplicated: Secondary | ICD-10-CM | POA: Insufficient documentation

## 2012-11-10 DIAGNOSIS — M25571 Pain in right ankle and joints of right foot: Secondary | ICD-10-CM

## 2012-11-10 MED ORDER — OXYCODONE-ACETAMINOPHEN 5-325 MG PO TABS
1.0000 | ORAL_TABLET | Freq: Once | ORAL | Status: AC
  Administered 2012-11-10: 1 via ORAL
  Filled 2012-11-10 (×2): qty 1

## 2012-11-10 MED ORDER — OXYCODONE-ACETAMINOPHEN 5-325 MG PO TABS: 1.0000 | ORAL_TABLET | Freq: Four times a day (QID) | ORAL | Status: DC | PRN

## 2012-11-10 NOTE — ED Notes (Addendum)
Pt c/o right ankle pain and sts she fell a "crack" today when standing on it. Pt denies injury. Pt reports pain subsides with rest. CMS intact, pt ambulating.

## 2012-11-10 NOTE — ED Notes (Signed)
MD at bedside. 

## 2012-11-10 NOTE — ED Provider Notes (Signed)
History     This chart was scribed for Gwyneth Sprout, MD by Jiles Prows, ED Scribe. The patient was seen in room MH03/MH03 and the patient's care was started at 5:44 PM.   CSN: [id number] Arrival date & time 11/10/12  1654    Chief Complaint  Patient presents with  . Ankle Pain   The history is provided by the patient and a friend. No language interpreter was used.   HPI Comments: Tracy Valenzuela is a 37 y.o. female who presents to the Emergency Department complaining of sudden, mild ankle pain onset today.  Pt reports she was getting up this morning and got up wrong.  She reports that she heard a "pop."  She claims that she has been dragging it since the incident when walking.  She states she did not roll her ankle, she just stood up on her right ankle and heard it crack with pain.  She reports issues with sciatica.  Pt denies headache, diaphoresis, fever, chills, nausea, vomiting, diarrhea, weakness, cough, SOB and any other pain.   Past Medical History  Diagnosis Date  . Asthma    Past Surgical History  Procedure Laterality Date  . Tubal ligation    . Cardiac catheterization     No family history on file. History  Substance Use Topics  . Smoking status: Current Some Day Smoker    Types: Cigarettes  . Smokeless tobacco: Not on file  . Alcohol Use: No   OB History   Grav Para Term Preterm Abortions TAB SAB Ect Mult Living                 Review of Systems A complete 10 system review of systems was obtained and all systems are negative except as noted in the HPI and PMH.   Allergies  Review of patient's allergies indicates no known allergies.  Home Medications  No current outpatient prescriptions on file. BP 139/62  Pulse 84  Temp(Src) 98.4 F (36.9 C) (Oral)  Resp 20  Ht 5\' 6"  (1.676 m)  Wt 240 lb (108.863 kg)  BMI 38.76 kg/m2  SpO2 100%  LMP 10/28/2012 Physical Exam  Nursing note and vitals reviewed. Constitutional: She is oriented to person, place, and  time. She appears well-developed and well-nourished. No distress.  HENT:  Head: Normocephalic and atraumatic.  Eyes: EOM are normal.  Cardiovascular: Normal rate and intact distal pulses.   Pulmonary/Chest: Effort normal.  Musculoskeletal: Normal range of motion.       Right ankle: She exhibits normal range of motion, no swelling, no ecchymosis, no deformity, no laceration and normal pulse. Tenderness. Medial malleolus tenderness found. No lateral malleolus, no AITFL, no CF ligament, no posterior TFL, no head of 5th metatarsal and no proximal fibula tenderness found. Achilles tendon normal.  Neurological: She is alert and oriented to person, place, and time. She has normal strength. No sensory deficit.  Skin: Skin is warm and dry.  Psychiatric: She has a normal mood and affect. Her behavior is normal.    ED Course  Procedures (including critical care time) DIAGNOSTIC STUDIES: Oxygen Saturation is 100% on RA, normal by my interpretation.    COORDINATION OF CARE: 5:47 PM - Discussed ED treatment with pt at bedside including x-ray and pain management and pt agrees.   Labs Reviewed - No data to display Dg Ankle Complete Right  11/10/2012   *RADIOLOGY REPORT*  Clinical Data: Medial pain after "crack" earlier today.  RIGHT ANKLE - COMPLETE 3+ VIEW  Comparison: None.  Findings: Negative for acute fracture or malalignment.  Tiny ossific density inferior to the medial malleolus appears chronic. No acute soft tissue abnormality.  IMPRESSION: Negative right ankle radiographs.   Original Report Authenticated By: Tiburcio Pea   No diagnosis found.  MDM   Patient with pain to the right ankle when standing today. She denies any fall or twisting of the ankle but has been unable to bear weight since she felt hot this morning. Notable swelling the pain over the medial malleolus.  X-rays negative however he did see a density inferior to medial malleolus that appears chronic. Patient placed in an ankle  air splint and put on crutches and pain medication. He'll follow up if symptoms do not improve in 1 week. Patient has good pulse and sensation in the foot and no fibular head pain. For suspicion of patient's pain is related to her sciatica.  I personally performed the services described in this documentation, which was scribed in my presence.  The recorded information has been reviewed and considered.    Gwyneth Sprout, MD 11/10/12 [phone]

## 2013-04-29 ENCOUNTER — Encounter (HOSPITAL_COMMUNITY): Payer: Self-pay | Admitting: Emergency Medicine

## 2013-04-29 ENCOUNTER — Emergency Department (HOSPITAL_COMMUNITY)
Admission: EM | Admit: 2013-04-29 | Discharge: 2013-04-30 | Disposition: A | Discharge status: Home / Self Care | Payer: BC Managed Care – PPO | Attending: Emergency Medicine | Admitting: Emergency Medicine

## 2013-04-29 DIAGNOSIS — Z9851 Tubal ligation status: Secondary | ICD-10-CM | POA: Insufficient documentation

## 2013-04-29 DIAGNOSIS — Z9889 Other specified postprocedural states: Secondary | ICD-10-CM | POA: Insufficient documentation

## 2013-04-29 DIAGNOSIS — R1084 Generalized abdominal pain: Secondary | ICD-10-CM | POA: Insufficient documentation

## 2013-04-29 DIAGNOSIS — N939 Abnormal uterine and vaginal bleeding, unspecified: Secondary | ICD-10-CM

## 2013-04-29 DIAGNOSIS — J45909 Unspecified asthma, uncomplicated: Secondary | ICD-10-CM | POA: Insufficient documentation

## 2013-04-29 DIAGNOSIS — Z862 Personal history of diseases of the blood and blood-forming organs and certain disorders involving the immune mechanism: Secondary | ICD-10-CM | POA: Insufficient documentation

## 2013-04-29 DIAGNOSIS — F17200 Nicotine dependence, unspecified, uncomplicated: Secondary | ICD-10-CM | POA: Insufficient documentation

## 2013-04-29 DIAGNOSIS — Z3202 Encounter for pregnancy test, result negative: Secondary | ICD-10-CM | POA: Insufficient documentation

## 2013-04-29 DIAGNOSIS — N898 Other specified noninflammatory disorders of vagina: Secondary | ICD-10-CM | POA: Insufficient documentation

## 2013-04-29 LAB — CBC WITH DIFFERENTIAL/PLATELET
Basophils Absolute: 0 10*3/uL (ref 0.0–0.1)
Basophils Relative: 1 % (ref 0–1)
Eosinophils Absolute: 0.1 10*3/uL (ref 0.0–0.7)
Eosinophils Relative: 3 % (ref 0–5)
HCT: 26.4 % — ABNORMAL LOW (ref 36.0–46.0)
Hemoglobin: 8 g/dL — ABNORMAL LOW (ref 12.0–15.0)
Lymphocytes Relative: 33 % (ref 12–46)
Lymphs Abs: 1.7 10*3/uL (ref 0.7–4.0)
MCH: 22.6 pg — ABNORMAL LOW (ref 26.0–34.0)
MCHC: 30.3 g/dL (ref 30.0–36.0)
MCV: 74.6 fL — ABNORMAL LOW (ref 78.0–100.0)
Monocytes Absolute: 0.3 10*3/uL (ref 0.1–1.0)
Monocytes Relative: 7 % (ref 3–12)
Neutro Abs: 3 10*3/uL (ref 1.7–7.7)
Neutrophils Relative %: 58 % (ref 43–77)
Platelets: 384 10*3/uL (ref 150–400)
RBC: 3.54 MIL/uL — ABNORMAL LOW (ref 3.87–5.11)
RDW: 15.9 % — ABNORMAL HIGH (ref 11.5–15.5)
WBC: 5.3 10*3/uL (ref 4.0–10.5)

## 2013-04-29 LAB — URINE MICROSCOPIC-ADD ON

## 2013-04-29 LAB — WET PREP, GENITAL
Clue Cells Wet Prep HPF POC: NONE SEEN
Trich, Wet Prep: NONE SEEN
Yeast Wet Prep HPF POC: NONE SEEN

## 2013-04-29 LAB — URINALYSIS, ROUTINE W REFLEX MICROSCOPIC
Bilirubin Urine: NEGATIVE
Glucose, UA: NEGATIVE mg/dL
Ketones, ur: NEGATIVE mg/dL
Nitrite: NEGATIVE
Protein, ur: 30 mg/dL — AB
Specific Gravity, Urine: 1.031 — ABNORMAL HIGH (ref 1.005–1.030)
Urobilinogen, UA: 1 mg/dL (ref 0.0–1.0)
pH: 6 (ref 5.0–8.0)

## 2013-04-29 LAB — POCT PREGNANCY, URINE: Preg Test, Ur: NEGATIVE

## 2013-04-29 MED ORDER — KETOROLAC TROMETHAMINE 30 MG/ML IJ SOLN
30.0000 mg | Freq: Once | INTRAMUSCULAR | Status: AC
  Administered 2013-04-29: 30 mg via INTRAMUSCULAR
  Filled 2013-04-29: qty 1

## 2013-04-29 NOTE — ED Notes (Addendum)
Pt c/o heavy vaginal bleeding, including clots, lower back pain, and abdominal cramping starting this morning.  Pt sts "I have never had bleeding like this before and it smells like something is dead.  My period was a week late."  Pain score 9/10.

## 2013-04-29 NOTE — ED Provider Notes (Signed)
CSN: [id number]     Arrival date & time 04/29/13  1504 History   First MD Initiated Contact with Patient 04/29/13 1719     Chief Complaint  Patient presents with  . Vaginal Bleeding  . Abdominal Cramping   (Consider location/radiation/quality/duration/timing/severity/associated sxs/prior Treatment) HPI Comments: Patient presents today with a chief complaint of vaginal bleeding.  She state that she has been having spotting for the past 3-4 days and then today bleeding became more heavy.  She states that today she has had to change a pad seven or eight times.  She reports that the pad is saturated when she changes it.  She has also noticed small blood clots today.  Blood is bright red.  She reports that her last normal menstrual period was five weeks ago.  She normally has a period every four weeks ago.  She reports that she normally has heavy periods, but this period is heavier than usual.  She is currently sexually active.  She denies abdominal pain, but reports that she has been having some lower abdominal cramping.  She denies fever, chills, nausea, vomiting, diarrhea, dizziness, lightheadedness, weakness, or SOB.  She is currently not on any type of contraceptive.  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  The history is provided by the patient.    Past Medical History  Diagnosis Date  . Asthma    Past Surgical History  Procedure Laterality Date  . Tubal ligation    . Cardiac catheterization     History reviewed. No pertinent family history. History  Substance Use Topics  . Smoking status: Current Some Day Smoker    Types: Cigarettes  . Smokeless tobacco: Never Used  . Alcohol Use: Yes   OB History   Grav Para Term Preterm Abortions TAB SAB Ect Mult Living                 Review of Systems  All other systems reviewed and are negative.    Allergies  Review of patient's allergies indicates no known allergies.  Home Medications  No current outpatient prescriptions on  file. BP 141/67  Pulse 63  Temp(Src) 97.8 F (36.6 C) (Oral)  SpO2 100%  LMP 04/29/2013 Physical Exam  Nursing note and vitals reviewed. Constitutional: She appears well-developed and well-nourished.  HENT:  Head: Normocephalic and atraumatic.  Mouth/Throat: Oropharynx is clear and moist.  Neck: Normal range of motion. Neck supple.  Cardiovascular: Normal rate, regular rhythm and normal heart sounds.   Pulmonary/Chest: Effort normal and breath sounds normal.  Abdominal: Soft. Bowel sounds are normal. She exhibits no distension and no mass. There is tenderness. There is no rebound and no guarding.  Mild generalized abdominal pain  Genitourinary: Cervix exhibits no motion tenderness. Right adnexum displays no mass, no tenderness and no fullness. Left adnexum displays no mass, no tenderness and no fullness.  Moderate blood visualized in the vaginal vault.  No blood clots.  Musculoskeletal: Normal range of motion.  Neurological: She is alert.  Skin: Skin is warm and dry.  Psychiatric: She has a normal mood and affect.    ED Course  Procedures (including critical care time) Labs Review Labs Reviewed  GC/CHLAMYDIA PROBE AMP  WET PREP, GENITAL  URINALYSIS, ROUTINE W REFLEX MICROSCOPIC  CBC WITH DIFFERENTIAL   Imaging Review No results found.  EKG Interpretation   None       MDM  No diagnosis found. Patient presenting today with vaginal bleeding that has been very light over the past  3-4 days, but became more heavy today.  On pelvic exam no localized tenderness.  Patient is hemodynamically stable.  She is anemic, but appears to be anemic at baseline.  She reports that she has a history of heavy menstrual periods and anemia.  She denies weakness, lightheadedness, or SOB.  Urine pregnancy is negative.  Feel that the patient is stable for discharge.  Patient given referral to Gynecology.  Return precautions discussed.      Santiago Glad, PA-C 05/01/13 [phone]

## 2013-04-29 NOTE — ED Notes (Signed)
Pt. Is unable to use the restroom at this time, but is aware that we need a urine specimen.  

## 2013-04-30 LAB — GC/CHLAMYDIA PROBE AMP
CT Probe RNA: NEGATIVE
GC Probe RNA: NEGATIVE

## 2013-05-03 NOTE — ED Provider Notes (Signed)
Medical screening examination/treatment/procedure(s) were performed by non-physician practitioner and as supervising physician I was immediately available for consultation/collaboration.  EKG Interpretation   None          , MD 05/03/13 0737 

## 2013-08-11 ENCOUNTER — Encounter (HOSPITAL_COMMUNITY): Payer: Self-pay | Admitting: Emergency Medicine

## 2013-08-11 ENCOUNTER — Emergency Department (HOSPITAL_COMMUNITY): Payer: BC Managed Care – PPO

## 2013-08-11 ENCOUNTER — Emergency Department (HOSPITAL_COMMUNITY)
Admission: EM | Admit: 2013-08-11 | Discharge: 2013-08-11 | Disposition: A | Discharge status: Home / Self Care | Payer: Self-pay | Attending: Emergency Medicine | Admitting: Emergency Medicine

## 2013-08-11 DIAGNOSIS — F17200 Nicotine dependence, unspecified, uncomplicated: Secondary | ICD-10-CM | POA: Insufficient documentation

## 2013-08-11 DIAGNOSIS — Z9889 Other specified postprocedural states: Secondary | ICD-10-CM | POA: Insufficient documentation

## 2013-08-11 DIAGNOSIS — J45901 Unspecified asthma with (acute) exacerbation: Secondary | ICD-10-CM | POA: Insufficient documentation

## 2013-08-11 DIAGNOSIS — R21 Rash and other nonspecific skin eruption: Secondary | ICD-10-CM | POA: Insufficient documentation

## 2013-08-11 DIAGNOSIS — D649 Anemia, unspecified: Secondary | ICD-10-CM | POA: Insufficient documentation

## 2013-08-11 DIAGNOSIS — Z8742 Personal history of other diseases of the female genital tract: Secondary | ICD-10-CM | POA: Insufficient documentation

## 2013-08-11 DIAGNOSIS — R0602 Shortness of breath: Secondary | ICD-10-CM

## 2013-08-11 DIAGNOSIS — R1310 Dysphagia, unspecified: Secondary | ICD-10-CM | POA: Insufficient documentation

## 2013-08-11 LAB — CBC
HCT: 24.5 % — ABNORMAL LOW (ref 36.0–46.0)
Hemoglobin: 7.3 g/dL — ABNORMAL LOW (ref 12.0–15.0)
MCH: 21.1 pg — ABNORMAL LOW (ref 26.0–34.0)
MCHC: 29.8 g/dL — ABNORMAL LOW (ref 30.0–36.0)
MCV: 70.8 fL — ABNORMAL LOW (ref 78.0–100.0)
Platelets: 342 10*3/uL (ref 150–400)
RBC: 3.46 MIL/uL — ABNORMAL LOW (ref 3.87–5.11)
RDW: 16.4 % — ABNORMAL HIGH (ref 11.5–15.5)
WBC: 4.5 10*3/uL (ref 4.0–10.5)

## 2013-08-11 LAB — BASIC METABOLIC PANEL
BUN: 9 mg/dL (ref 6–23)
CO2: 23 mEq/L (ref 19–32)
Calcium: 8.9 mg/dL (ref 8.4–10.5)
Chloride: 101 mEq/L (ref 96–112)
Creatinine, Ser: 0.65 mg/dL (ref 0.50–1.10)
GFR calc Af Amer: >90 mL/min (ref >90)
GFR calc non Af Amer: >90 mL/min (ref >90)
Glucose, Bld: 96 mg/dL (ref 70–99)
Potassium: 4 mEq/L (ref 3.7–5.3)
Sodium: 137 mEq/L (ref 137–147)

## 2013-08-11 LAB — PROTIME-INR
INR: 1 (ref 0.00–1.49)
Prothrombin Time: 13 seconds (ref 11.6–15.2)

## 2013-08-11 LAB — PRO B NATRIURETIC PEPTIDE: Pro B Natriuretic peptide (BNP): 11 pg/mL (ref 0–125)

## 2013-08-11 MED ORDER — DIPHENHYDRAMINE HCL 50 MG/ML IJ SOLN
25.0000 mg | Freq: Once | INTRAMUSCULAR | Status: AC
  Administered 2013-08-11: 25 mg via INTRAVENOUS
  Filled 2013-08-11: qty 1

## 2013-08-11 MED ORDER — FAMOTIDINE IN NACL 20-0.9 MG/50ML-% IV SOLN
20.0000 mg | Freq: Once | INTRAVENOUS | Status: AC
  Administered 2013-08-11: 20 mg via INTRAVENOUS
  Filled 2013-08-11: qty 50

## 2013-08-11 MED ORDER — SODIUM CHLORIDE 0.9 % IV BOLUS (SEPSIS)
1000.0000 mL | Freq: Once | INTRAVENOUS | Status: AC
  Administered 2013-08-11: 1000 mL via INTRAVENOUS

## 2013-08-11 MED ORDER — FERROUS SULFATE 325 (65 FE) MG PO TABS: 325.0000 mg | ORAL_TABLET | Freq: Every day | ORAL | Status: DC

## 2013-08-11 MED ORDER — METHYLPREDNISOLONE SODIUM SUCC 125 MG IJ SOLR
125.0000 mg | Freq: Once | INTRAMUSCULAR | Status: AC
  Administered 2013-08-11: 125 mg via INTRAVENOUS
  Filled 2013-08-11: qty 2

## 2013-08-11 NOTE — ED Notes (Signed)
The patient said she has felt like she cannot swallow and SOB since Sunday.  The patient also said she has had a "fine" rash on her face today, but she has not changed anything in her diet, or lotions, or detergents.  The patient just started having chest pain about 1520hrs but thought it was her allergic reaction.  She complains of not being able to swallow and that is what brought her here.    She feels like she has "something stuck in her throat".

## 2013-08-11 NOTE — ED Notes (Signed)
Pt st's she doesn't feel any different since receiving meds.  St's continues to itch and feels like something is stuck in her throat.  MD made aware.

## 2013-08-11 NOTE — ED Notes (Signed)
Pt given Ginger Ale and drinking at this time

## 2013-08-11 NOTE — Discharge Instructions (Signed)
Take the prescribed medication as directed-- this will help increase your blood counts. Follow-up with GI regarding difficulties swallowing-- call and scheduled appt. Follow up with GYN to discuss heavy periods. Return to the ED for new or worsening symptoms.

## 2013-08-11 NOTE — ED Notes (Signed)
Pt in x-ray at this time

## 2013-08-11 NOTE — ED Provider Notes (Signed)
CSN: [id number]     Arrival date & time 08/11/13  1558 History   First MD Initiated Contact with Patient 08/11/13 1659     Chief Complaint  Patient presents with  . Allergic Reaction     (Consider location/radiation/quality/duration/timing/severity/associated sxs/prior Treatment) The history is provided by the patient and medical records.   This is a 38 year old female with past no history significant for bipolar disorder, asthma, presenting to the ED for difficulty swallowing and shortness of breath since yesterday.  Pt states she mostly has difficulty swallowing solid foods, liquids go down without difficulty.  States she can swallow the food without difficulty but feels it gets "stuck" after she swallows.  She has not vomited the food back up but states she feels like she has difficulty getting air when the food gets stuck. No prior hx of esophageal varices, strictures, or difficulties with swallowing. States earlier today she had a fine rash break out on her face and she began feeling "like her skin was crawling".  She denies changes in soaps, detergents, lotions, cosmetic products, diet, or medications.  Denies any sore throat, cough, congestion, fever, chills, or other URI sx.  She developed from chest pain in her upper mid-sternal region around 1520 but thinks it is because she still feels something "stuck" in her throat.  She denies dizziness, palpitations, diaphoresis, nausea, numbness, or paresthesias of extremities.  Pt states family hx of early onset CAD.  She has had prior cardiac cath in 2010 due to abnormal EKG findings concerning for STEMI which was normal.  Pt is a daily smoker.  VS stable on arrival.  Past Medical History  Diagnosis Date  . Asthma    Past Surgical History  Procedure Laterality Date  . Tubal ligation    . Cardiac catheterization     History reviewed. No pertinent family history. History  Substance Use Topics  . Smoking status: Current Some Day Smoker   Types: Cigarettes  . Smokeless tobacco: Never Used  . Alcohol Use: Yes   OB History   Grav Para Term Preterm Abortions TAB SAB Ect Mult Living                 Review of Systems  HENT: Positive for trouble swallowing.   Respiratory: Positive for shortness of breath.   Skin: Positive for rash.  All other systems reviewed and are negative.     Allergies  Review of patient's allergies indicates no known allergies.  Home Medications   Prior to Admission medications   Not on File   BP 115/98  Pulse 61  Temp(Src) 98.4 F (36.9 C) (Oral)  Resp 16  Ht 5\' 7"  (1.702 m)  Wt 254 lb (115.214 kg)  BMI 39.77 kg/m2  SpO2 93%  LMP 07/28/2013  Physical Exam  Nursing note and vitals reviewed. Constitutional: She is oriented to person, place, and time. She appears well-developed and well-nourished. No distress.  HENT:  Head: Normocephalic and atraumatic.  Mouth/Throat: Uvula is midline, oropharynx is clear and moist and mucous membranes are normal. No oropharyngeal exudate, posterior oropharyngeal edema or posterior oropharyngeal erythema.  Tonsils 1+ bilaterally without erythema or exudate; uvula midline; no peritonsillar abscess; handling secretions appropriately; swallowing saliva without difficulty; no visible FB noted in posterior oropharynx  Eyes: Conjunctivae and EOM are normal. Pupils are equal, round, and reactive to light.  Neck: Normal range of motion. Neck supple.  Cardiovascular: Normal rate, regular rhythm and normal heart sounds.   Pulmonary/Chest: Effort normal and breath  sounds normal. No respiratory distress. She has no wheezes.  Abdominal: Soft. Bowel sounds are normal.  Musculoskeletal: Normal range of motion. She exhibits no edema.  Neurological: She is alert and oriented to person, place, and time.  Skin: Skin is warm and dry. She is not diaphoretic.  Psychiatric: She has a normal mood and affect.    ED Course  Procedures (including critical care time)    Date: 08/12/2013  Rate: 77  Rhythm: normal sinus rhythm  QRS Axis: normal  Intervals: normal  ST/T Wave abnormalities: normal  Conduction Disutrbances:none  Narrative Interpretation:   Old EKG Reviewed: unchanged   Labs Review Labs Reviewed  CBC - Abnormal; Notable for the following:    RBC 3.46 (*)    Hemoglobin 7.3 (*)    HCT 24.5 (*)    MCV 70.8 (*)    MCH 21.1 (*)    MCHC 29.8 (*)    RDW 16.4 (*)    All other components within normal limits  PROTIME-INR  BASIC METABOLIC PANEL  PRO B NATRIURETIC PEPTIDE  I-STAT TROPOININ, ED    Imaging Review Dg Chest 2 View  08/11/2013   CLINICAL DATA:  Difficulty swallowing and shortness of breath.  EXAM: CHEST - 2 VIEW  COMPARISON:  DG RIBS UNILATERAL W/CHEST*L* dated 03/16/2009; DG CHEST 1V PORT dated 10/11/2008  FINDINGS: The heart is mildly enlarged in appears slightly more prominent in size compared to prior radiographs. There is no associated edema, pleural effusion or pulmonary consolidation. No nodule or pneumothorax is identified. The bony thorax is unremarkable.  IMPRESSION: More prominent heart size without evidence of acute finding.   Electronically Signed   By: Aletta Edouard M.D.   On: 08/11/2013 19:13     EKG Interpretation None      MDM   Final diagnoses:  Trouble swallowing  Shortness of breath   EKG normal sinus rhythm without acute ischemic change.  Labs as above, worsening anemia from baseline Hemoglobin 7.3 today, down from 8.0 4 months ago.  Pt has hx of heavy period and was referred to GYN but has not followed up.  She denies any melenotic stools.  Labs otherwise largely unremarkable.  CXR is clear.  Pt is PERC negative.  At this time I have low suspicion for ACS, PE, dissection, or other acute cardiac event. Pt was given allergic rxn protocol with some improvement of skin sx.  She continues to feel the lump in her throat but has been handling her secretions normally and drinking PO fluids without difficulty.   No vomiting while in the ED.  She no longer complains of shortness of breath, Her O2 sats have remained stable on RA without signs of respiratory distress.  Will refer to GI for likely upper endoscopy for further eval of her swallowing difficulties.  Referred pt again to GYN, recommend starting Fe+ supplementation.  Encouraged soft diet, eat slowly and drink plenty of water while eating to help ease passage of foods.  Discussed plan with pt, she acknowledged understanding and agreed with plan of care.  Return precautions given for new or worsening symptoms.  Discussed with Dr. Alvino Chapel who agrees with assessment and plan of care.  Larene Pickett, PA-C 08/12/13 [place], PA-C 08/12/13 [phone]

## 2013-08-12 NOTE — ED Provider Notes (Signed)
Medical screening examination/treatment/procedure(s) were performed by non-physician practitioner and as supervising physician I was immediately available for consultation/collaboration.   EKG Interpretation None       Tracy Valenzuela. Alvino Chapel, [facility] 08/12/13 [phone]

## 2013-10-15 ENCOUNTER — Ambulatory Visit: Payer: BC Managed Care – PPO

## 2014-10-05 ENCOUNTER — Ambulatory Visit: Payer: Self-pay | Admitting: Family

## 2014-10-19 ENCOUNTER — Other Ambulatory Visit (INDEPENDENT_AMBULATORY_CARE_PROVIDER_SITE_OTHER): Payer: 59

## 2014-10-19 ENCOUNTER — Telehealth: Payer: Self-pay | Admitting: *Deleted

## 2014-10-19 ENCOUNTER — Encounter: Payer: Self-pay | Admitting: Family

## 2014-10-19 ENCOUNTER — Telehealth: Payer: Self-pay | Admitting: Family

## 2014-10-19 ENCOUNTER — Ambulatory Visit (INDEPENDENT_AMBULATORY_CARE_PROVIDER_SITE_OTHER): Payer: 59 | Admitting: Family

## 2014-10-19 VITALS — BP 124/74 | HR 66 | Temp 98.3°F | Resp 18 | Ht 66.0 in | Wt 253.0 lb

## 2014-10-19 DIAGNOSIS — N921 Excessive and frequent menstruation with irregular cycle: Secondary | ICD-10-CM | POA: Insufficient documentation

## 2014-10-19 DIAGNOSIS — D649 Anemia, unspecified: Secondary | ICD-10-CM

## 2014-10-19 DIAGNOSIS — F419 Anxiety disorder, unspecified: Secondary | ICD-10-CM | POA: Insufficient documentation

## 2014-10-19 DIAGNOSIS — D509 Iron deficiency anemia, unspecified: Secondary | ICD-10-CM

## 2014-10-19 DIAGNOSIS — F329 Major depressive disorder, single episode, unspecified: Secondary | ICD-10-CM | POA: Insufficient documentation

## 2014-10-19 DIAGNOSIS — F32A Depression, unspecified: Secondary | ICD-10-CM

## 2014-10-19 DIAGNOSIS — J3489 Other specified disorders of nose and nasal sinuses: Secondary | ICD-10-CM

## 2014-10-19 DIAGNOSIS — F418 Other specified anxiety disorders: Secondary | ICD-10-CM

## 2014-10-19 DIAGNOSIS — J019 Acute sinusitis, unspecified: Secondary | ICD-10-CM | POA: Insufficient documentation

## 2014-10-19 LAB — CBC
HCT: 24.3 % — ABNORMAL LOW (ref 36.0–46.0)
Hemoglobin: 7.4 g/dL — CL (ref 12.0–15.0)
MCHC: 30.6 g/dL (ref 30.0–36.0)
MCV: 64.2 fl — ABNORMAL LOW (ref 78.0–100.0)
Platelets: 357 10*3/uL (ref 150.0–400.0)
RBC: 3.78 Mil/uL — ABNORMAL LOW (ref 3.87–5.11)
RDW: 17.6 % — ABNORMAL HIGH (ref 11.5–15.5)
WBC: 4.7 10*3/uL (ref 4.0–10.5)

## 2014-10-19 LAB — IBC PANEL
Iron: 28 ug/dL — ABNORMAL LOW (ref 42–145)
Saturation Ratios: 5.3 % — ABNORMAL LOW (ref 20.0–50.0)
Transferrin: 375 mg/dL — ABNORMAL HIGH (ref 212.0–360.0)

## 2014-10-19 MED ORDER — CLONAZEPAM 0.5 MG PO TABS: 0.5000 mg | ORAL_TABLET | Freq: Two times a day (BID) | ORAL | Status: DC | PRN

## 2014-10-19 MED ORDER — PAROXETINE HCL 20 MG PO TABS: 20.0000 mg | ORAL_TABLET | Freq: Every day | ORAL | Status: DC

## 2014-10-19 NOTE — Progress Notes (Signed)
Pre visit review using our clinic review tool, if applicable. No additional management support is needed unless otherwise documented below in the visit note. 

## 2014-10-19 NOTE — Assessment & Plan Note (Signed)
Symptoms and exam consistent with anxiety and depression. Restart Paxil and Klonopin as needed for anxiety. Denies suicidal ideations. Follow-up in one month to determine effectiveness of treatment.

## 2014-10-19 NOTE — Assessment & Plan Note (Signed)
Symptoms and exam consistent with viral upper respiratory infection/sinusitis. Treat conservatively at this time with over-the-counter medications as needed for symptom relief and supportive care. Follow-up if symptoms worsen or fail to improve.

## 2014-10-19 NOTE — Patient Instructions (Signed)
Thank you for choosing [facility].  Summary/Instructions:  Your prescription(s) have been submitted to your pharmacy or been printed and provided for you. Please take as directed and contact our office if you believe you are having problem(s) with the medication(s) or have any questions.  Please stop by the lab on the basement level of the building for your blood work. Your results will be released to [facility] (or called to you) after review, usually within 72 hours after test completion. If any changes need to be made, you will be notified at that same time.  If your symptoms worsen or fail to improve, please contact our office for further instruction, or in case of emergency go directly to the emergency room at the closest medical facility.   General Recommendations:    Please drink plenty of fluids.  Get plenty of rest   Sleep in humidified air  Use saline nasal sprays  Netti pot   OTC Medications:  Decongestants - helps relieve congestion   Flonase (generic fluticasone) or Nasacort (generic triamcinolone) - please make sure to use the "cross-over" technique at a 45 degree angle towards the opposite eye as opposed to straight up the nasal passageway.   Sudafed (generic pseudoephedrine - Note this is the one that is available behind the pharmacy counter); Products with phenylephrine (-PE) may also be used but is often not as effective as pseudoephedrine.   If you have HIGH BLOOD PRESSURE - Coricidin HBP; AVOID any product that is -D as this contains pseudoephedrine which may increase your blood pressure.  Afrin (oxymetazoline) every 6-8 hours for up to 3 days.   Allergies - helps relieve runny nose, itchy eyes and sneezing   Claritin (generic loratidine), Allegra (fexofenidine), or Zyrtec (generic cyrterizine) for runny nose. These medications should not cause drowsiness.  Note - Benadryl (generic diphenhydramine) may be used however may cause drowsiness  Cough  -   Delsym or Robitussin (generic dextromethorphan)  Expectorants - helps loosen mucus to ease removal   Mucinex (generic guaifenesin) as directed on the package.  Headaches / General Aches   Tylenol (generic acetaminophen) - DO NOT EXCEED 3 grams (3,000 mg) in a 24 hour time period  Advil/Motrin (generic ibuprofen)   Sore Throat -   Salt water gargle   Chloraseptic (generic benzocaine) spray or lozenges / Sucrets (generic dyclonine)

## 2014-10-19 NOTE — Progress Notes (Signed)
Subjective:    Patient ID: Tracy Valenzuela, female    DOB: May 24, 1975, 39 y.o.   MRN: [id number]  Chief Complaint  Patient presents with  . Establish Care    has a hx of heavy bleeding and had a low blood count on last lab, wants labs checked and wants to discuss anxiety and having trouble focusing at her job    HPI:  Tracy Valenzuela is a 39 y.o. female with a PMH of menometrorrhagia, anxiety, depression, and iron deficiency anemia who presents today for an office visit to establish care.   1.) Anemia - Previous history of anemia secondary to heavy menstrual cycles. Previous lab work shows a hemoglobin of 7.3 and an MCV of 70.8. Has previously been on iron tablets and did not have a prescription for them. Associated symptoms of occasional dizziness and fatigue. Denies shortness of breath.   2.) Anxiety - Associated symptom of feeling anxious has been going on for, and describes it almost as a sense of impending doom. Describes the inability to focus on things which effects her job. Modifying factors include paxil and klonopin which helped significantly controlled her symptoms. Timing of her symptoms tend to be worse at night as her mind wanders and the severity of the anxiety/thought process prevents her from falling asleep on occasion.   3.) Sinus pressure - Associated symptom of sinus pressure and cough have been going on for about 3 days. Denies any modifying factors that make it better or worse.   Allergies  Allergen Reactions  . Penicillins Hives     Outpatient Prescriptions Prior to Visit  Medication Sig Dispense Refill  . diphenhydrAMINE (BENADRYL) 25 MG tablet Take 25 mg by mouth every 6 (six) hours as needed for allergies.    . ferrous sulfate 325 (65 FE) MG tablet Take 1 tablet (325 mg total) by mouth daily. 30 tablet 0   No facility-administered medications prior to visit.     Past Medical History  Diagnosis Date  . Asthma   . Bipolar 1 disorder   . Depression   .  Anxiety   . Anemia   . Prediabetes   . Heart murmur      Past Surgical History  Procedure Laterality Date  . Tubal ligation    . Cardiac catheterization       Family History  Problem Relation Age of Onset  . Lupus Mother   . Heart disease Mother   . Diabetes Maternal Grandmother   . Heart disease Maternal Grandmother   . Hypertension Maternal Grandmother   . Prostate cancer Maternal Grandfather   . Alzheimer's disease Paternal Grandfather      History   Social History  . Marital Status: Legally Separated    Spouse Name: N/A  . Number of Children: 3  . Years of Education: 14   Occupational History  . Health care manager    Social History Main Topics  . Smoking status: Former Smoker    Types: Cigarettes    Quit date: 04/30/2014  . Smokeless tobacco: Never Used  . Alcohol Use: Yes     Comment: occasionally  . Drug Use: No  . Sexual Activity: Yes    Birth Control/ Protection: Surgical   Other Topics Concern  . Not on file   Social History Narrative   Fun: Sleep     Review of Systems  Constitutional: Positive for chills and fatigue. Negative for fever.  Respiratory: Positive for shortness of breath. Negative for chest tightness.  Cardiovascular: Negative for chest pain, palpitations and leg swelling.  Neurological: Positive for dizziness. Negative for weakness and headaches.  Psychiatric/Behavioral: Positive for sleep disturbance and dysphoric mood. Negative for suicidal ideas. The patient is nervous/anxious.       Objective:    BP 124/74 mmHg  Pulse 66  Temp(Src) 98.3 F (36.8 C) (Oral)  Resp 18  Ht 5\' 6"  (1.676 m)  Wt 253 lb (114.76 kg)  BMI 40.85 kg/m2  SpO2 97% Nursing note and vital signs reviewed.  Physical Exam  Constitutional: She is oriented to person, place, and time. She appears well-developed and well-nourished. No distress.  Obese female seated in the chair, appears her stated age and is dressed appropriately for the situation.    HENT:  Mouth/Throat: Mucous membranes are pale.  Cardiovascular: Normal rate, regular rhythm, normal heart sounds and intact distal pulses.   Pulmonary/Chest: Effort normal and breath sounds normal.  Neurological: She is alert and oriented to person, place, and time.  Skin: Skin is warm and dry.  Psychiatric: She has a normal mood and affect. Her behavior is normal. Judgment and thought content normal.        Assessment & Plan:   Problem List Items Addressed This Visit      Other   Menometrorrhagia - Primary    Symptoms of irregular and heavy menstrual cycles consistent with menometrorrhagia. Refer to gynecology for potential management as this is most likely either a cause of her iron deficiency anemia.      Relevant Orders   CBC   IBC panel   Ambulatory referral to Gynecology   Iron deficiency anemia    Obtain CBC and iron panel. Restart ferrous sulfate 325 mg daily. Follow-up with gynecology as anemia is most likely the result of blood loss during menstrual cycles. Follow-up if symptoms worsen or don't improve pending lab work.      Relevant Orders   CBC   IBC panel   Anxiety and depression    Symptoms and exam consistent with anxiety and depression. Restart Paxil and Klonopin as needed for anxiety. Denies suicidal ideations. Follow-up in one month to determine effectiveness of treatment.      Relevant Medications   clonazePAM (KLONOPIN) 0.5 MG tablet   PARoxetine (PAXIL) 20 MG tablet   Sinus pressure    Symptoms and exam consistent with viral upper respiratory infection/sinusitis. Treat conservatively at this time with over-the-counter medications as needed for symptom relief and supportive care. Follow-up if symptoms worsen or fail to improve.

## 2014-10-19 NOTE — Telephone Encounter (Signed)
Patient states she needs paxil to go to [facility].  She is requesting her chart to be updated to reflect this as her primary pharmacy.

## 2014-10-19 NOTE — Telephone Encounter (Signed)
Received critical value for Hemoglobin of 7.4. This is consistent with previous hemoglobin levels remains above <7.0 so no transfusion is necessary at this time. Patient started on iron supplementation. Will follow up with GYN. Instructed to seek emergency care if symptoms worsen. Recheck hemoglobin in 1 week.

## 2014-10-19 NOTE — Assessment & Plan Note (Signed)
Obtain CBC and iron panel. Restart ferrous sulfate 325 mg daily. Follow-up with gynecology as anemia is most likely the result of blood loss during menstrual cycles. Follow-up if symptoms worsen or don't improve pending lab work.

## 2014-10-19 NOTE — Assessment & Plan Note (Signed)
Symptoms of irregular and heavy menstrual cycles consistent with menometrorrhagia. Refer to gynecology for potential management as this is most likely either a cause of her iron deficiency anemia.

## 2014-10-19 NOTE — Telephone Encounter (Signed)
Received call from Lorie in the lab Critical Hemoglobin 7.4...[name]

## 2014-10-19 NOTE — Telephone Encounter (Signed)
Please inform the patient that I would like her to take 1 iron pill twice daily (ferrous sulfate 325 mg) and would like her to repeat her hemoglobin in about 1 week.

## 2014-10-19 NOTE — Telephone Encounter (Signed)
Tracy Valenzuela has already sent med to [facility] updated pharmacy...[name]

## 2014-10-20 NOTE — Telephone Encounter (Signed)
Pt aware.

## 2014-11-04 ENCOUNTER — Telehealth: Payer: Self-pay | Admitting: Family

## 2014-11-04 ENCOUNTER — Other Ambulatory Visit (HOSPITAL_COMMUNITY)
Admission: RE | Admit: 2014-11-04 | Discharge: 2014-11-04 | Disposition: A | Discharge status: Home / Self Care | Payer: 59 | Source: Ambulatory Visit | Attending: Women's Health | Admitting: Women's Health

## 2014-11-04 ENCOUNTER — Other Ambulatory Visit (INDEPENDENT_AMBULATORY_CARE_PROVIDER_SITE_OTHER): Payer: 59

## 2014-11-04 ENCOUNTER — Ambulatory Visit (INDEPENDENT_AMBULATORY_CARE_PROVIDER_SITE_OTHER): Payer: 59 | Admitting: Women's Health

## 2014-11-04 ENCOUNTER — Encounter: Payer: Self-pay | Admitting: Women's Health

## 2014-11-04 ENCOUNTER — Other Ambulatory Visit: Payer: Self-pay | Admitting: Family

## 2014-11-04 ENCOUNTER — Other Ambulatory Visit: Payer: Self-pay

## 2014-11-04 VITALS — BP 134/80 | Ht 67.0 in | Wt 248.0 lb

## 2014-11-04 DIAGNOSIS — D649 Anemia, unspecified: Secondary | ICD-10-CM

## 2014-11-04 DIAGNOSIS — R87810 Cervical high risk human papillomavirus (HPV) DNA test positive: Secondary | ICD-10-CM | POA: Diagnosis not present

## 2014-11-04 DIAGNOSIS — N921 Excessive and frequent menstruation with irregular cycle: Secondary | ICD-10-CM

## 2014-11-04 DIAGNOSIS — Z01411 Encounter for gynecological examination (general) (routine) with abnormal findings: Secondary | ICD-10-CM | POA: Diagnosis not present

## 2014-11-04 DIAGNOSIS — Z01419 Encounter for gynecological examination (general) (routine) without abnormal findings: Secondary | ICD-10-CM | POA: Diagnosis not present

## 2014-11-04 DIAGNOSIS — Z1151 Encounter for screening for human papillomavirus (HPV): Secondary | ICD-10-CM | POA: Insufficient documentation

## 2014-11-04 LAB — CBC
HCT: 24.4 % — ABNORMAL LOW (ref 36.0–46.0)
Hemoglobin: 7.5 g/dL — CL (ref 12.0–15.0)
MCHC: 30.7 g/dL (ref 30.0–36.0)
MCV: 65 fl — ABNORMAL LOW (ref 78.0–100.0)
Platelets: 447 10*3/uL — ABNORMAL HIGH (ref 150.0–400.0)
RBC: 3.76 Mil/uL — ABNORMAL LOW (ref 3.87–5.11)
RDW: 18.5 % — ABNORMAL HIGH (ref 11.5–15.5)
WBC: 4.2 10*3/uL (ref 4.0–10.5)

## 2014-11-04 LAB — TSH: TSH: 2.95 u[IU]/mL (ref 0.35–4.50)

## 2014-11-04 NOTE — Progress Notes (Signed)
Tracy Valenzuela 1975/09/12 [id number]    History:    Presents for annual exam.  New patient referred from primary care for anemia from menorrhagia. Monthly cycle, 7 days 3 of the days heavy flow changing every hour. BTL. H&H 7.4/24 at primary care. Has had spotting between cycles for the past 3 months. States mother cervical or uterine cancer with hysterectomy. Sister currently undergoing radiation treatment for questionable uterine or cervical cancer. Reports normal Pap history. Positive GC and Chlamydia 10/2011 with negative test of cure, negative cultures 03/2013/same partner. History of anxiety and depression primary care managing. Reports normal blood sugar and cholesterol primary care.  Past medical history, past surgical history, family history and social history were all reviewed and documented in the EPIC chart. Nurse's aide in long-term care. Twins 13, son 72.  ROS:  A ROS was performed and pertinent positives and negatives are included.  Exam:  Filed Vitals:   11/04/14 0927  BP: 134/80    General appearance:  Normal Thyroid:  Symmetrical, normal in size, without palpable masses or nodularity. Respiratory  Auscultation:  Clear without wheezing or rhonchi Cardiovascular  Auscultation:  Regular rate, without rubs, murmurs or gallops  Edema/varicosities:  Not grossly evident Abdominal  Soft,nontender, without masses, guarding or rebound.  Liver/spleen:  No organomegaly noted  Hernia:  None appreciated  Skin  Inspection:  Grossly normal   Breasts: Examined lying and sitting, pendulous.     Right: Without masses, retractions, discharge or axillary adenopathy.     Left: Without masses, retractions, discharge or axillary adenopathy. Gentitourinary   Inguinal/mons:  Normal without inguinal adenopathy  External genitalia:  Normal  BUS/Urethra/Skene's glands:  Normal  Vagina:  Normal  Cervix:  Normal  Uterus:   normal in size, shape and contour.  Midline and  mobile  Adnexa/parametria:     Rt: Without masses or tenderness.   Lt: Without masses or tenderness.  Anus and perineum: Normal  Digital rectal exam: Normal sphincter tone without palpated masses or tenderness  Assessment/Plan:  39 y.o. SBF G2P3 for annual exam.   Menorrhagia with spotting/BTL Anemia Obesity Anxiety/depression primary care manages labs and meds  Plan: TSH will have drawn with labs today at primary care. SBE's, annual mammogram at 42, calcium and iron rich diet, continue iron supplements as prescribed encouraged. Schedule sonohysterogram with Dr. Phineas Real after next cycle. Options of ablation, Mirena IUD. Reviewed importance of finding out if  sister, mother had uterine or cervical cancer. Continue decreasing calories and increasing exercise for continued weight loss. UA, Pap with HR HPV typing and GC/Chlamydia. Declines need for HIV, hepatitis or RPR, negative screen with current partner.    Huel Cote WHNP, 12:05 PM 11/04/2014

## 2014-11-04 NOTE — Patient Instructions (Signed)
First day of next cycle call office [phone]  For sonohysterogram with Dr Phineas Real  Menorrhagia Menorrhagia is the medical term for when your menstrual periods are heavy or last longer than usual. With menorrhagia, every period you have may cause enough blood loss and cramping that you are unable to maintain your usual activities. CAUSES  In some cases, the cause of heavy periods is unknown, but a number of conditions may cause menorrhagia. Common causes include:  A problem with the hormone-producing thyroid gland (hypothyroid).  Noncancerous growths in the uterus (polyps or fibroids).  An imbalance of the estrogen and progesterone hormones.  One of your ovaries not releasing an egg during one or more months.  Side effects of having an intrauterine device (IUD).  Side effects of some medicines, such as anti-inflammatory medicines or blood thinners.  A bleeding disorder that stops your blood from clotting normally. SIGNS AND SYMPTOMS  During a normal period, bleeding lasts between 4 and 8 days. Signs that your periods are too heavy include:  You routinely have to change your pad or tampon every 1 or 2 hours because it is completely soaked.  You pass blood clots larger than 1 inch (2.5 cm) in size.  You have bleeding for more than 7 days.  You need to use pads and tampons at the same time because of heavy bleeding.  You need to wake up to change your pads or tampons during the night.  You have symptoms of anemia, such as tiredness, fatigue, or shortness of breath. DIAGNOSIS  Your health care provider will perform a physical exam and ask you questions about your symptoms and menstrual history. Other tests may be ordered based on what the health care provider finds during the exam. These tests can include:  Blood tests. Blood tests are used to check if you are pregnant or have hormonal changes, a bleeding or thyroid disorder, low iron levels (anemia), or other  problems.  Endometrial biopsy. Your health care provider takes a sample of tissue from the inside of your uterus to be examined under a microscope.  Pelvic ultrasound. This test uses sound waves to make a picture of your uterus, ovaries, and vagina. The pictures can show if you have fibroids or other growths.  Hysteroscopy. For this test, your health care provider will use a small telescope to look inside your uterus. Based on the results of your initial tests, your health care provider may recommend further testing. TREATMENT  Treatment may not be needed. If it is needed, your health care provider may recommend treatment with one or more medicines first. If these do not reduce bleeding enough, a surgical treatment might be an option. The best treatment for you will depend on:   Whether you need to prevent pregnancy.  Your desire to have children in the future.  The cause and severity of your bleeding.  Your opinion and personal preference.  Medicines for menorrhagia may include:  Birth control methods that use hormones. These include the pill, skin patch, vaginal ring, shots that you get every 3 months, hormonal IUD, and implant. These treatments reduce bleeding during your menstrual period.  Medicines that thicken blood and slow bleeding.  Medicines that reduce swelling, such as ibuprofen.  Medicines that contain a synthetic hormone called progestin.   Medicines that make the ovaries stop working for a short time.  You may need surgical treatment for menorrhagia if the medicines are unsuccessful. Treatment options include:  Dilation and curettage (D&C). In this procedure,  your health care provider opens (dilates) your cervix and then scrapes or suctions tissue from the lining of your uterus to reduce menstrual bleeding.  Operative hysteroscopy. This procedure uses a tiny tube with a light (hysteroscope) to view your uterine cavity and can help in the surgical removal of a  polyp that may be causing heavy periods.  Endometrial ablation. Through various techniques, your health care provider permanently destroys the entire lining of your uterus (endometrium). After endometrial ablation, most women have little or no menstrual flow. Endometrial ablation reduces your ability to become pregnant.  Endometrial resection. This surgical procedure uses an electrosurgical wire loop to remove the lining of the uterus. This procedure also reduces your ability to become pregnant.  Hysterectomy. Surgical removal of the uterus and cervix is a permanent procedure that stops menstrual periods. Pregnancy is not possible after a hysterectomy. This procedure requires anesthesia and hospitalization. HOME CARE INSTRUCTIONS   Only take over-the-counter or prescription medicines as directed by your health care provider. Take prescribed medicines exactly as directed. Do not change or switch medicines without consulting your health care provider.  Take any prescribed iron pills exactly as directed by your health care provider. Long-term heavy bleeding may result in low iron levels. Iron pills help replace the iron your body lost from heavy bleeding. Iron may cause constipation. If this becomes a problem, increase the bran, fruits, and roughage in your diet.  Do not take aspirin or medicines that contain aspirin 1 week before or during your menstrual period. Aspirin may make the bleeding worse.  If you need to change your sanitary pad or tampon more than once every 2 hours, stay in bed and rest as much as possible until the bleeding stops.  Eat well-balanced meals. Eat foods high in iron. Examples are leafy green vegetables, meat, liver, eggs, and whole grain breads and cereals. Do not try to lose weight until the abnormal bleeding has stopped and your blood iron level is back to normal. SEEK MEDICAL CARE IF:   You soak through a pad or tampon every 1 or 2 hours, and this happens every time you  have a period.  You need to use pads and tampons at the same time because you are bleeding so much.  You need to change your pad or tampon during the night.  You have a period that lasts for more than 8 days.  You pass clots bigger than 1 inch wide.  You have irregular periods that happen more or less often than once a month.  You feel dizzy or faint.  You feel very weak or tired.  You feel short of breath or feel your heart is beating too fast when you exercise.  You have nausea and vomiting or diarrhea while you are taking your medicine.  You have any problems that may be related to the medicine you are taking. SEEK IMMEDIATE MEDICAL CARE IF:   You soak through 4 or more pads or tampons in 2 hours.  You have any bleeding while you are pregnant. MAKE SURE YOU:   Understand these instructions.  Will watch your condition.  Will get help right away if you are not doing well or get worse. Document Released: 04/16/2005 Document Revised: 04/21/2013 Document Reviewed: 10/05/2012 [facility] Patient Information 2015 [place], [place]. This information is not intended to replace advice given to you by your health care provider. Make sure you discuss any questions you have with your health care provider. Health Maintenance Adopting a healthy lifestyle  and getting preventive care can go a long way to promote health and wellness. Talk with your health care provider about what schedule of regular examinations is right for you. This is a good chance for you to check in with your provider about disease prevention and staying healthy. In between checkups, there are plenty of things you can do on your own. Experts have done a lot of research about which lifestyle changes and preventive measures are most likely to keep you healthy. Ask your health care provider for more information. WEIGHT AND DIET  Eat a healthy diet  Be sure to include plenty of vegetables, fruits, low-fat dairy products, and  lean protein.  Do not eat a lot of foods high in solid fats, added sugars, or salt.  Get regular exercise. This is one of the most important things you can do for your health.  Most adults should exercise for at least 150 minutes each week. The exercise should increase your heart rate and make you sweat (moderate-intensity exercise).  Most adults should also do strengthening exercises at least twice a week. This is in addition to the moderate-intensity exercise.  Maintain a healthy weight  Body mass index (BMI) is a measurement that can be used to identify possible weight problems. It estimates body fat based on height and weight. Your health care provider can help determine your BMI and help you achieve or maintain a healthy weight.  For females 79 years of age and older:   A BMI below 18.5 is considered underweight.  A BMI of 18.5 to 24.9 is normal.  A BMI of 25 to 29.9 is considered overweight.  A BMI of 30 and above is considered obese.  Watch levels of cholesterol and blood lipids  You should start having your blood tested for lipids and cholesterol at 37 years of age, then have this test every 5 years.  You may need to have your cholesterol levels checked more often if:  Your lipid or cholesterol levels are high.  You are older than 37 years of age.  You are at high risk for heart disease.  CANCER SCREENING   Lung Cancer  Lung cancer screening is recommended for adults 9-53 years old who are at high risk for lung cancer because of a history of smoking.  A yearly low-dose CT scan of the lungs is recommended for people who:  Currently smoke.  Have quit within the past 15 years.  Have at least a 30-pack-year history of smoking. A pack year is smoking an average of one pack of cigarettes a day for 1 year.  Yearly screening should continue until it has been 15 years since you quit.  Yearly screening should stop if you develop a health problem that would prevent  you from having lung cancer treatment.  Breast Cancer  Practice breast self-awareness. This means understanding how your breasts normally appear and feel.  It also means doing regular breast self-exams. Let your health care provider know about any changes, no matter how small.  If you are in your 20s or 30s, you should have a clinical breast exam (CBE) by a health care provider every 1-3 years as part of a regular health exam.  If you are 33 or older, have a CBE every year. Also consider having a breast X-ray (mammogram) every year.  If you have a family history of breast cancer, talk to your health care provider about genetic screening.  If you are at high risk for breast  cancer, talk to your health care provider about having an MRI and a mammogram every year.  Breast cancer gene (BRCA) assessment is recommended for women who have family members with BRCA-related cancers. BRCA-related cancers include:  Breast.  Ovarian.  Tubal.  Peritoneal cancers.  Results of the assessment will determine the need for genetic counseling and BRCA1 and BRCA2 testing. Cervical Cancer Routine pelvic examinations to screen for cervical cancer are no longer recommended for nonpregnant women who are considered low risk for cancer of the pelvic organs (ovaries, uterus, and vagina) and who do not have symptoms. A pelvic examination may be necessary if you have symptoms including those associated with pelvic infections. Ask your health care provider if a screening pelvic exam is right for you.   The Pap test is the screening test for cervical cancer for women who are considered at risk.  If you had a hysterectomy for a problem that was not cancer or a condition that could lead to cancer, then you no longer need Pap tests.  If you are older than 65 years, and you have had normal Pap tests for the past 10 years, you no longer need to have Pap tests.  If you have had past treatment for cervical cancer or a  condition that could lead to cancer, you need Pap tests and screening for cancer for at least 20 years after your treatment.  If you no longer get a Pap test, assess your risk factors if they change (such as having a new sexual partner). This can affect whether you should start being screened again.  Some women have medical problems that increase their chance of getting cervical cancer. If this is the case for you, your health care provider may recommend more frequent screening and Pap tests.  The human papillomavirus (HPV) test is another test that may be used for cervical cancer screening. The HPV test looks for the virus that can cause cell changes in the cervix. The cells collected during the Pap test can be tested for HPV.  The HPV test can be used to screen women 53 years of age and older. Getting tested for HPV can extend the interval between normal Pap tests from three to five years.  An HPV test also should be used to screen women of any age who have unclear Pap test results.  After 37 years of age, women should have HPV testing as often as Pap tests.  Colorectal Cancer  This type of cancer can be detected and often prevented.  Routine colorectal cancer screening usually begins at 37 years of age and continues through 37 years of age.  Your health care provider may recommend screening at an earlier age if you have risk factors for colon cancer.  Your health care provider may also recommend using home test kits to check for hidden blood in the stool.  A small camera at the end of a tube can be used to examine your colon directly (sigmoidoscopy or colonoscopy). This is done to check for the earliest forms of colorectal cancer.  Routine screening usually begins at age 23.  Direct examination of the colon should be repeated every 5-10 years through 37 years of age. However, you may need to be screened more often if early forms of precancerous polyps or small growths are found. Skin  Cancer  Check your skin from head to toe regularly.  Tell your health care provider about any new moles or changes in moles, especially if there is  a change in a mole's shape or color.  Also tell your health care provider if you have a mole that is larger than the size of a pencil eraser.  Always use sunscreen. Apply sunscreen liberally and repeatedly throughout the day.  Protect yourself by wearing long sleeves, pants, a wide-brimmed hat, and sunglasses whenever you are outside. HEART DISEASE, DIABETES, AND HIGH BLOOD PRESSURE   Have your blood pressure checked at least every 1-2 years. High blood pressure causes heart disease and increases the risk of stroke.  If you are between 48 years and 6 years old, ask your health care provider if you should take aspirin to prevent strokes.  Have regular diabetes screenings. This involves taking a blood sample to check your fasting blood sugar level.  If you are at a normal weight and have a low risk for diabetes, have this test once every three years after 37 years of age.  If you are overweight and have a high risk for diabetes, consider being tested at a younger age or more often. PREVENTING INFECTION  Hepatitis B  If you have a higher risk for hepatitis B, you should be screened for this virus. You are considered at high risk for hepatitis B if:  You were born in a country where hepatitis B is common. Ask your health care provider which countries are considered high risk.  Your parents were born in a high-risk country, and you have not been immunized against hepatitis B (hepatitis B vaccine).  You have HIV or AIDS.  You use needles to inject street drugs.  You live with someone who has hepatitis B.  You have had sex with someone who has hepatitis B.  You get hemodialysis treatment.  You take certain medicines for conditions, including cancer, organ transplantation, and autoimmune conditions. Hepatitis C  Blood testing is  recommended for:  Everyone born from [birth year] through 1963.  Anyone with known risk factors for hepatitis C. Sexually transmitted infections (STIs)  You should be screened for sexually transmitted infections (STIs) including gonorrhea and chlamydia if:  You are sexually active and are younger than 37 years of age.  You are older than 37 years of age and your health care provider tells you that you are at risk for this type of infection.  Your sexual activity has changed since you were last screened and you are at an increased risk for chlamydia or gonorrhea. Ask your health care provider if you are at risk.  If you do not have HIV, but are at risk, it may be recommended that you take a prescription medicine daily to prevent HIV infection. This is called pre-exposure prophylaxis (PrEP). You are considered at risk if:  You are sexually active and do not regularly use condoms or know the HIV status of your partner(s).  You take drugs by injection.  You are sexually active with a partner who has HIV. Talk with your health care provider about whether you are at high risk of being infected with HIV. If you choose to begin PrEP, you should first be tested for HIV. You should then be tested every 3 months for as long as you are taking PrEP.  PREGNANCY   If you are premenopausal and you may become pregnant, ask your health care provider about preconception counseling.  If you may become pregnant, take 400 to 800 micrograms (mcg) of folic acid every day.  If you want to prevent pregnancy, talk to your health care provider about birth control (  contraception). OSTEOPOROSIS AND MENOPAUSE   Osteoporosis is a disease in which the bones lose minerals and strength with aging. This can result in serious bone fractures. Your risk for osteoporosis can be identified using a bone density scan.  If you are 24 years of age or older, or if you are at risk for osteoporosis and fractures, ask your health care  provider if you should be screened.  Ask your health care provider whether you should take a calcium or vitamin D supplement to lower your risk for osteoporosis.  Menopause may have certain physical symptoms and risks.  Hormone replacement therapy may reduce some of these symptoms and risks. Talk to your health care provider about whether hormone replacement therapy is right for you.  HOME CARE INSTRUCTIONS   Schedule regular health, dental, and eye exams.  Stay current with your immunizations.   Do not use any tobacco products including cigarettes, chewing tobacco, or electronic cigarettes.  If you are pregnant, do not drink alcohol.  If you are breastfeeding, limit how much and how often you drink alcohol.  Limit alcohol intake to no more than 1 drink per day for nonpregnant women. One drink equals 12 ounces of beer, 5 ounces of wine, or 1 ounces of hard liquor.  Do not use street drugs.  Do not share needles.  Ask your health care provider for help if you need support or information about quitting drugs.  Tell your health care provider if you often feel depressed.  Tell your health care provider if you have ever been abused or do not feel safe at home. Document Released: 10/30/2010 Document Revised: 08/31/2013 Document Reviewed: 03/18/2013 [facility] Patient Information 2015 [place], [place]. This information is not intended to replace advice given to you by your health care provider. Make sure you discuss any questions you have with your health care provider.

## 2014-11-05 LAB — URINALYSIS W MICROSCOPIC + REFLEX CULTURE
Bilirubin Urine: NEGATIVE
Casts: NONE SEEN
Crystals: NONE SEEN
Glucose, UA: NEGATIVE mg/dL
Ketones, ur: NEGATIVE mg/dL
Nitrite: NEGATIVE
Protein, ur: NEGATIVE mg/dL
Specific Gravity, Urine: 1.024 (ref 1.005–1.030)
Urobilinogen, UA: 0.2 mg/dL (ref 0.0–1.0)
pH: 5.5 (ref 5.0–8.0)

## 2014-11-05 LAB — CYTOLOGY - PAP

## 2014-11-06 LAB — URINE CULTURE: Colony Count: >=100000

## 2014-11-12 ENCOUNTER — Telehealth: Payer: Self-pay | Admitting: Family

## 2014-11-12 ENCOUNTER — Ambulatory Visit: Payer: 59 | Admitting: Family

## 2014-11-12 DIAGNOSIS — Z0289 Encounter for other administrative examinations: Secondary | ICD-10-CM

## 2014-11-12 NOTE — Telephone Encounter (Signed)
Patient no showed for follow up today.  Please advise.

## 2014-11-15 NOTE — Telephone Encounter (Signed)
Ok to reschedule

## 2014-11-16 NOTE — Telephone Encounter (Signed)
Got scheduled  °

## 2014-11-22 ENCOUNTER — Ambulatory Visit: Payer: 59 | Admitting: Family

## 2014-11-22 DIAGNOSIS — Z0289 Encounter for other administrative examinations: Secondary | ICD-10-CM

## 2014-11-22 NOTE — Telephone Encounter (Signed)
Noted  

## 2014-11-22 NOTE — Telephone Encounter (Signed)
Has 1 more no show

## 2014-11-22 NOTE — Telephone Encounter (Signed)
Patient cancelled appt for today's follow up.  States would call back to reschedule.

## 2014-11-29 DIAGNOSIS — IMO0002 Reserved for concepts with insufficient information to code with codable children: Secondary | ICD-10-CM

## 2014-11-29 HISTORY — DX: Reserved for concepts with insufficient information to code with codable children: IMO0002

## 2014-12-10 ENCOUNTER — Telehealth: Payer: Self-pay | Admitting: *Deleted

## 2014-12-10 DIAGNOSIS — F329 Major depressive disorder, single episode, unspecified: Secondary | ICD-10-CM

## 2014-12-10 DIAGNOSIS — F419 Anxiety disorder, unspecified: Secondary | ICD-10-CM

## 2014-12-10 DIAGNOSIS — F32A Depression, unspecified: Secondary | ICD-10-CM

## 2014-12-10 MED ORDER — PAROXETINE HCL 20 MG PO TABS: 20.0000 mg | ORAL_TABLET | Freq: Every day | ORAL | Status: DC

## 2014-12-10 MED ORDER — CLONAZEPAM 0.5 MG PO TABS: 0.5000 mg | ORAL_TABLET | Freq: Two times a day (BID) | ORAL | Status: DC | PRN

## 2014-12-10 NOTE — Telephone Encounter (Signed)
Left msg on triage requesting refills on her paxil & clonazepam. Pls advise...[name]

## 2014-12-10 NOTE — Telephone Encounter (Signed)
Medications have been refilled ?

## 2014-12-17 ENCOUNTER — Encounter: Payer: Self-pay | Admitting: Gynecology

## 2014-12-17 ENCOUNTER — Ambulatory Visit (INDEPENDENT_AMBULATORY_CARE_PROVIDER_SITE_OTHER): Payer: 59 | Admitting: Gynecology

## 2014-12-17 VITALS — BP 124/76

## 2014-12-17 DIAGNOSIS — R896 Abnormal cytological findings in specimens from other organs, systems and tissues: Secondary | ICD-10-CM | POA: Diagnosis not present

## 2014-12-17 DIAGNOSIS — R87810 Cervical high risk human papillomavirus (HPV) DNA test positive: Secondary | ICD-10-CM

## 2014-12-17 DIAGNOSIS — IMO0002 Reserved for concepts with insufficient information to code with codable children: Secondary | ICD-10-CM

## 2014-12-17 NOTE — Progress Notes (Signed)
Tracy Valenzuela 07/24/1975 [id number]        40 y.o.  [id number] presents for colposcopy with most recent Pap smear showing ASCUS with positive high-risk HPV subtype 18/45. Patient relates always being told she had an abnormal Pap smear through her gynecologist in [place]. Never had to undergo colposcopy or biopsies but they just repeated the Pap smears. I have no copies of these reports.  Past medical history,surgical history, problem list, medications, allergies, family history and social history were all reviewed and documented in the EPIC chart.  Directed ROS with pertinent positives and negatives documented in the history of present illness/assessment and plan.  Exam: Kim assistant Filed Vitals:   12/17/14 1503  BP: 124/76   General appearance:  Normal Pelvic: External BUS vagina normal. Cervix normal. Uterus grossly normal midline mobile nontender. Adnexa without masses or tenderness.  Colposcopy after acetic acid cleanse is adequate with translucent metaplastic type epithelium 12:00. Representative biopsy taken. ECC performed. No other abnormalities noted. Physical Exam  Genitourinary:       Assessment/Plan:  39 y.o. [id number] with ASCUS/high-risk HPV subtype 18/45. Colposcopy showed metaplastic epithelium but no  Abnormalities. Representative biopsy and ECC performed. Patient will follow up for biopsy results. If negative plan expected management with follow up Pap smear one year. It otherwise normal triage based on results. I reviewed dysplasia with the patient high-grade/low-grade, progression/regression and the HPV association. Patient is also having menorrhagia and is in the process of scheduling a sonohysterogram with me. I emphasized the need for her to follow up for this.    Anastasio Auerbach MD, 3:24 PM 12/17/2014

## 2014-12-17 NOTE — Patient Instructions (Signed)
Colposcopy Care After Colposcopy is a procedure in which a special tool is used to magnify the surface of the cervix. A tissue sample (biopsy) may also be taken. This sample will be looked at for cervical cancer or other problems. After the test:  You may have some cramping.  Lie down for a few minutes if you feel lightheaded.   You may have some bleeding which should stop in a few days. HOME CARE  Do not have sex or use tampons for 2 to 3 days or as told.  Only take medicine as told by your doctor.  Continue to take your birth control pills as usual. Finding out the results of your test Ask when your test results will be ready. Make sure you get your test results. GET HELP RIGHT AWAY IF:  You are bleeding a lot or are passing blood clots.  You develop a fever of 102 F (38.9 C) or higher.  You have abnormal vaginal discharge.  You have cramps that do not go away with medicine.  You feel lightheaded, dizzy, or pass out (faint). MAKE SURE YOU:   Understand these instructions.  Will watch your condition.  Will get help right away if you are not doing well or get worse. Document Released: 10/03/2007 Document Revised: 07/09/2011 Document Reviewed: 11/13/2012 [facility] Patient Information 2015 [place], [place]. This information is not intended to replace advice given to you by your health care provider. Make sure you discuss any questions you have with your health care provider.

## 2014-12-23 ENCOUNTER — Encounter: Payer: Self-pay | Admitting: Gynecology

## 2015-01-24 ENCOUNTER — Other Ambulatory Visit: Payer: Self-pay | Admitting: Family

## 2015-01-24 NOTE — Telephone Encounter (Signed)
Last refills were 8/12

## 2015-03-14 ENCOUNTER — Telehealth: Payer: Self-pay | Admitting: Family

## 2015-03-14 NOTE — Telephone Encounter (Signed)
Last refills were 9/27

## 2015-03-14 NOTE — Telephone Encounter (Signed)
Pt is requesting refills on clonazePAM (KLONOPIN) 0.5 MG tablet [id number] and PARoxetine (PAXIL) 20 MG tablet [id number] Pharmacy is [facility] on [address]

## 2015-03-15 MED ORDER — CLONAZEPAM 0.5 MG PO TABS: 0.5000 mg | ORAL_TABLET | Freq: Two times a day (BID) | ORAL | Status: DC | PRN

## 2015-03-15 NOTE — Addendum Note (Signed)
Addended by: Mauricio Po D on: 03/15/2015 08:11 AM   Modules accepted: Orders

## 2015-03-15 NOTE — Telephone Encounter (Signed)
Medication refilled

## 2015-03-15 NOTE — Telephone Encounter (Signed)
Rx has been faxed.

## 2015-04-07 ENCOUNTER — Encounter: Payer: Self-pay | Admitting: Family

## 2015-04-07 ENCOUNTER — Ambulatory Visit (INDEPENDENT_AMBULATORY_CARE_PROVIDER_SITE_OTHER): Payer: 59 | Admitting: Family

## 2015-04-07 VITALS — BP 118/60 | HR 63 | Temp 98.1°F | Resp 18 | Ht 67.0 in | Wt 254.1 lb

## 2015-04-07 DIAGNOSIS — J3489 Other specified disorders of nose and nasal sinuses: Secondary | ICD-10-CM

## 2015-04-07 MED ORDER — PREDNISONE 20 MG PO TABS: 20.0000 mg | ORAL_TABLET | Freq: Two times a day (BID) | ORAL | Status: DC

## 2015-04-07 MED ORDER — BENZONATATE 100 MG PO CAPS: 100.0000 mg | ORAL_CAPSULE | Freq: Three times a day (TID) | ORAL | Status: DC | PRN

## 2015-04-07 MED ORDER — LEVOFLOXACIN 500 MG PO TABS: 500.0000 mg | ORAL_TABLET | Freq: Every day | ORAL | Status: DC

## 2015-04-07 MED ORDER — HYDROCOD POLST-CPM POLST ER 10-8 MG/5ML PO SUER: 5.0000 mL | Freq: Every evening | ORAL | Status: DC | PRN

## 2015-04-07 NOTE — Progress Notes (Signed)
Pre visit review using our clinic review tool, if applicable. No additional management support is needed unless otherwise documented below in the visit note. 

## 2015-04-07 NOTE — Patient Instructions (Signed)
Thank you for choosing [facility].  Summary/Instructions:  Your prescription(s) have been submitted to your pharmacy or been printed and provided for you. Please take as directed and contact our office if you believe you are having problem(s) with the medication(s) or have any questions.  If your symptoms worsen or fail to improve, please contact our office for further instruction, or in case of emergency go directly to the emergency room at the closest medical facility.   Please hold off on the antibiotic for the next 2-4 days. If your symptoms improve there is no need for the antibiotic.   General Recommendations:    Please drink plenty of fluids.  Get plenty of rest   Sleep in humidified air  Use saline nasal sprays  Netti pot   OTC Medications:  Decongestants - helps relieve congestion   Flonase (generic fluticasone) or Nasacort (generic triamcinolone) - please make sure to use the "cross-over" technique at a 45 degree angle towards the opposite eye as opposed to straight up the nasal passageway.   Sudafed (generic pseudoephedrine - Note this is the one that is available behind the pharmacy counter); Products with phenylephrine (-PE) may also be used but is often not as effective as pseudoephedrine.   If you have HIGH BLOOD PRESSURE - Coricidin HBP; AVOID any product that is -D as this contains pseudoephedrine which may increase your blood pressure.  Afrin (oxymetazoline) every 6-8 hours for up to 3 days.   Allergies - helps relieve runny nose, itchy eyes and sneezing   Claritin (generic loratidine), Allegra (fexofenidine), or Zyrtec (generic cyrterizine) for runny nose. These medications should not cause drowsiness.  Note - Benadryl (generic diphenhydramine) may be used however may cause drowsiness  Cough -   Delsym or Robitussin (generic dextromethorphan)  Expectorants - helps loosen mucus to ease removal   Mucinex (generic guaifenesin) as directed on  the package.  Headaches / General Aches   Tylenol (generic acetaminophen) - DO NOT EXCEED 3 grams (3,000 mg) in a 24 hour time period  Advil/Motrin (generic ibuprofen)   Sore Throat -   Salt water gargle   Chloraseptic (generic benzocaine) spray or lozenges / Sucrets (generic dyclonine)

## 2015-04-07 NOTE — Assessment & Plan Note (Signed)
Symptoms and exam consistent with sinusitis. Treat conservatively at this time with prednisone, Tussionex as needed for cough and sleep, and Tessalon Perles. Written prescription for level for given to patient if symptoms worsen over the next 2-3 days. Continue over-the-counter medications as needed for symptom relief and supportive care. Follow-up if symptoms worsen or fail to improve.

## 2015-04-07 NOTE — Progress Notes (Signed)
Subjective:    Patient ID: Tracy Valenzuela, female    DOB: 1975-10-05, 39 y.o.   MRN: [id number]  Chief Complaint  Patient presents with  . Cough    chest tightness, cough, sore throat, sinus headache, congestion, eyes watering    HPI:  Tracy Valenzuela is a 39 y.o. female who  has a past medical history of Asthma; Bipolar 1 disorder ([place]); Prediabetes; Heart murmur; ASCUS with positive high risk HPV (11/2014); and LGSIL (low grade squamous intraepithelial dysplasia) (11/2014). and presents today for an acute office visit.  This is a new problem. Associated symptom of a cough, chest tightness, sore throat, sinus headache, congestion and watering eyes have been going on for about 2 days. States that it started out mild but the course has continually worsened. Modifying factors include albuterol and eucolyptus and lavendar rub which has helped a little. Reports a low grade subjective fever that she treated with Tylenol. Severity of the cough is enough to keep her up at night.   Allergies  Allergen Reactions  . Penicillins Hives     Current Outpatient Prescriptions on File Prior to Visit  Medication Sig Dispense Refill  . clonazePAM (KLONOPIN) 0.5 MG tablet Take 1 tablet (0.5 mg total) by mouth 2 (two) times daily as needed. for anxiety 60 tablet 0  . PARoxetine (PAXIL) 20 MG tablet TAKE ONE TABLET BY MOUTH ONCE DAILY 30 tablet 0   No current facility-administered medications on file prior to visit.    Review of Systems  Constitutional: Positive for fever. Negative for chills.  HENT: Positive for congestion, sinus pressure, sneezing and sore throat.   Respiratory: Positive for cough and chest tightness. Negative for shortness of breath.   Neurological: Positive for headaches.      Objective:    BP 118/60 mmHg  Pulse 63  Temp(Src) 98.1 F (36.7 C) (Oral)  Resp 18  Ht 5\' 7"  (1.702 m)  Wt 254 lb 1.9 oz (115.268 kg)  BMI 39.79 kg/m2  SpO2 99% Nursing note and vital signs  reviewed.  Physical Exam  Constitutional: She is oriented to person, place, and time. She appears well-developed and well-nourished. No distress.  HENT:  Right Ear: Hearing, tympanic membrane, external ear and ear canal normal.  Left Ear: Hearing, tympanic membrane, external ear and ear canal normal.  Nose: Right sinus exhibits maxillary sinus tenderness and frontal sinus tenderness. Left sinus exhibits maxillary sinus tenderness and frontal sinus tenderness.  Mouth/Throat: Uvula is midline, oropharynx is clear and moist and mucous membranes are normal.  Neck: Neck supple.  Cardiovascular: Normal rate, regular rhythm, normal heart sounds and intact distal pulses.   Pulmonary/Chest: Effort normal and breath sounds normal.  Neurological: She is alert and oriented to person, place, and time.  Skin: Skin is warm and dry.  Psychiatric: She has a normal mood and affect. Her behavior is normal. Judgment and thought content normal.       Assessment & Plan:   Problem List Items Addressed This Visit      Other   Sinus pressure - Primary    Symptoms and exam consistent with sinusitis. Treat conservatively at this time with prednisone, Tussionex as needed for cough and sleep, and Tessalon Perles. Written prescription for level for given to patient if symptoms worsen over the next 2-3 days. Continue over-the-counter medications as needed for symptom relief and supportive care. Follow-up if symptoms worsen or fail to improve.      Relevant Medications   benzonatate (TESSALON PERLES)  100 MG capsule   chlorpheniramine-HYDROcodone (TUSSIONEX PENNKINETIC ER) 10-8 MG/5ML SUER   predniSONE (DELTASONE) 20 MG tablet   levofloxacin (LEVAQUIN) 500 MG tablet

## 2015-04-22 ENCOUNTER — Telehealth: Payer: Self-pay | Admitting: *Deleted

## 2015-04-22 MED ORDER — CLONAZEPAM 0.5 MG PO TABS: 0.5000 mg | ORAL_TABLET | Freq: Two times a day (BID) | ORAL | Status: DC | PRN

## 2015-04-22 NOTE — Telephone Encounter (Signed)
Received call pt states she is needing refill on her paxil & klonopin. Inform pt will send paxil, but i will have to forward msg to cover physician for approval on Klonopin. Pls advise on refill...[name]

## 2015-04-22 NOTE — Telephone Encounter (Signed)
Notified pt rx called refill into pharmacy...Tracy Valenzuela

## 2015-04-22 NOTE — Telephone Encounter (Signed)
#  30 until her PCP back

## 2015-05-14 ENCOUNTER — Ambulatory Visit (INDEPENDENT_AMBULATORY_CARE_PROVIDER_SITE_OTHER): Payer: 59 | Admitting: Family Medicine

## 2015-05-14 ENCOUNTER — Encounter: Payer: Self-pay | Admitting: Family Medicine

## 2015-05-14 VITALS — BP 120/80 | HR 84 | Temp 98.9°F | Wt 254.0 lb

## 2015-05-14 DIAGNOSIS — J3489 Other specified disorders of nose and nasal sinuses: Secondary | ICD-10-CM

## 2015-05-14 DIAGNOSIS — J0100 Acute maxillary sinusitis, unspecified: Secondary | ICD-10-CM | POA: Diagnosis not present

## 2015-05-14 MED ORDER — BENZONATATE 100 MG PO CAPS: 100.0000 mg | ORAL_CAPSULE | Freq: Three times a day (TID) | ORAL | Status: DC | PRN

## 2015-05-14 MED ORDER — LEVOFLOXACIN 500 MG PO TABS: 500.0000 mg | ORAL_TABLET | Freq: Every day | ORAL | Status: DC

## 2015-05-14 MED ORDER — HYDROCOD POLST-CPM POLST ER 10-8 MG/5ML PO SUER
5.0000 mL | Freq: Every evening | ORAL | Status: DC | PRN
Start: 2015-05-14 — End: 2015-09-30

## 2015-05-14 NOTE — Progress Notes (Signed)
Pre visit review using our clinic review tool, if applicable. No additional management support is needed unless otherwise documented below in the visit note. 

## 2015-05-14 NOTE — Assessment & Plan Note (Signed)
Suspect this never really resolved -though a new uri may also be in play Refilled levaquin - 10 days Disc symptomatic care - see instructions on AVS  Refilled tussionex with caution of sedation  Also tessalon  Did not refill prednisone this time-will update if wheeze or other symptoms Update if not starting to improve in a week or if worsening

## 2015-05-14 NOTE — Patient Instructions (Signed)
You may still have a sinus infection / also cannot rule out a new virus Take levaquin as directed  Cough medicines as needed Rest and fluids Update if not starting to improve in a week or if worsening

## 2015-05-14 NOTE — Progress Notes (Signed)
Subjective:    Patient ID: Tracy Valenzuela, female    DOB: 07/16/1975, 40 y.o.   MRN: [id number]  HPI Was seen in 97 - dx sinusitis  levqauin and cough med and prednisone   Felt initially better with antibiotic and then started getting worse again (may have caught a 2nd cold)  From the first of the year- felt malaise/ ? Fever Then coughing  Sinuses are congested - yellow drainage and face hurts again  Ears are ok  Throat hurts a bit  Chest is congestion - can't get it up   Tried nyquil - helped a bit  Patient Active Problem List   Diagnosis Date Noted  . Menometrorrhagia 10/19/2014  . Iron deficiency anemia 10/19/2014  . Anxiety and depression 10/19/2014  . Acute sinusitis 10/19/2014   Past Medical History  Diagnosis Date  . Asthma   . Bipolar 1 disorder ([place])   . Prediabetes   . Heart murmur   . ASCUS with positive high risk HPV 11/2014  . LGSIL (low grade squamous intraepithelial dysplasia) 11/2014    colposcopy biopsy.  recommend follow up pap in one year   Past Surgical History  Procedure Laterality Date  . Tubal ligation    . Cardiac catheterization     Social History  Substance Use Topics  . Smoking status: Former Smoker    Types: Cigarettes    Quit date: 04/30/2014  . Smokeless tobacco: Never Used  . Alcohol Use: 0.0 oz/week    0 Standard drinks or equivalent per week     Comment: occasionally   Family History  Problem Relation Age of Onset  . Lupus Mother   . Heart disease Mother   . Diabetes Maternal Grandmother   . Heart disease Maternal Grandmother   . Hypertension Maternal Grandmother   . Prostate cancer Maternal Grandfather   . Alzheimer's disease Paternal Grandfather    Allergies  Allergen Reactions  . Penicillins Hives   Current Outpatient Prescriptions on File Prior to Visit  Medication Sig Dispense Refill  . clonazePAM (KLONOPIN) 0.5 MG tablet Take 1 tablet (0.5 mg total) by mouth 2 (two) times daily as needed. for anxiety 30 tablet  0  . PARoxetine (PAXIL) 20 MG tablet TAKE ONE TABLET BY MOUTH ONCE DAILY 30 tablet 0   No current facility-administered medications on file prior to visit.    Review of Systems  Constitutional: Positive for appetite change. Negative for fever and fatigue.  HENT: Positive for congestion, ear pain, postnasal drip, rhinorrhea, sinus pressure and sore throat. Negative for nosebleeds.   Eyes: Negative for pain, redness and itching.  Respiratory: Positive for cough. Negative for shortness of breath and wheezing.   Cardiovascular: Negative for chest pain.  Gastrointestinal: Negative for nausea, vomiting, abdominal pain and diarrhea.  Endocrine: Negative for polyuria.  Genitourinary: Negative for dysuria, urgency and frequency.  Musculoskeletal: Negative for myalgias and arthralgias.  Allergic/Immunologic: Negative for immunocompromised state.  Neurological: Positive for headaches. Negative for dizziness, tremors, syncope, weakness and numbness.  Hematological: Negative for adenopathy. Does not bruise/bleed easily.  Psychiatric/Behavioral: Negative for dysphoric mood. The patient is not nervous/anxious.        Objective:   Physical Exam  Constitutional: She appears well-developed and well-nourished. No distress.  Obese and fatigued appearing   HENT:  Head: Normocephalic and atraumatic.  Right Ear: External ear normal.  Left Ear: External ear normal.  Mouth/Throat: Oropharynx is clear and moist. No oropharyngeal exudate.  Nares are injected and congested  Bilateral  maxillary sinus tenderness  Post nasal drip   Eyes: Conjunctivae and EOM are normal. Pupils are equal, round, and reactive to light. Right eye exhibits no discharge. Left eye exhibits no discharge.  Neck: Normal range of motion. Neck supple.  Cardiovascular: Normal rate and regular rhythm.   Pulmonary/Chest: Effort normal and breath sounds normal. No respiratory distress. She has no wheezes. She has no rales.  No  wheeze Harsh bs  Lymphadenopathy:    She has no cervical adenopathy.  Neurological: She is alert. No cranial nerve deficit.  Skin: Skin is warm and dry. No rash noted.  Psychiatric: She has a normal mood and affect.          Assessment & Plan:   Problem List Items Addressed This Visit      Respiratory   Acute sinusitis - Primary    Suspect this never really resolved -though a new uri may also be in play Refilled levaquin - 10 days Disc symptomatic care - see instructions on AVS  Refilled tussionex with caution of sedation  Also tessalon  Did not refill prednisone this time-will update if wheeze or other symptoms Update if not starting to improve in a week or if worsening        Relevant Medications   levofloxacin (LEVAQUIN) 500 MG tablet   chlorpheniramine-HYDROcodone (TUSSIONEX PENNKINETIC ER) 10-8 MG/5ML SUER   benzonatate (TESSALON PERLES) 100 MG capsule

## 2015-06-22 ENCOUNTER — Telehealth: Payer: Self-pay | Admitting: Family

## 2015-06-22 MED ORDER — CLONAZEPAM 0.5 MG PO TABS: 0.5000 mg | ORAL_TABLET | Freq: Two times a day (BID) | ORAL | Status: DC | PRN

## 2015-06-22 MED ORDER — PAROXETINE HCL 20 MG PO TABS: 20.0000 mg | ORAL_TABLET | Freq: Every day | ORAL | Status: DC

## 2015-06-22 NOTE — Telephone Encounter (Signed)
Pt request refill for Paxil and klonopin to be send to [facility] on [address]. Please help

## 2015-06-22 NOTE — Telephone Encounter (Signed)
Please advise. Last refill of klonopin was 12/23

## 2015-06-22 NOTE — Telephone Encounter (Signed)
Needs office visit for next refill.

## 2015-06-24 ENCOUNTER — Ambulatory Visit: Payer: 59 | Admitting: Family

## 2015-09-30 ENCOUNTER — Encounter: Payer: Self-pay | Admitting: Family

## 2015-09-30 ENCOUNTER — Ambulatory Visit (INDEPENDENT_AMBULATORY_CARE_PROVIDER_SITE_OTHER): Payer: 59 | Admitting: Family

## 2015-09-30 VITALS — BP 104/68 | HR 74 | Temp 98.0°F | Resp 16 | Ht 67.0 in | Wt 251.0 lb

## 2015-09-30 DIAGNOSIS — F3162 Bipolar disorder, current episode mixed, moderate: Secondary | ICD-10-CM

## 2015-09-30 DIAGNOSIS — F418 Other specified anxiety disorders: Secondary | ICD-10-CM | POA: Diagnosis not present

## 2015-09-30 DIAGNOSIS — R41840 Attention and concentration deficit: Secondary | ICD-10-CM | POA: Insufficient documentation

## 2015-09-30 DIAGNOSIS — F419 Anxiety disorder, unspecified: Secondary | ICD-10-CM

## 2015-09-30 DIAGNOSIS — F32A Depression, unspecified: Secondary | ICD-10-CM

## 2015-09-30 DIAGNOSIS — F329 Major depressive disorder, single episode, unspecified: Secondary | ICD-10-CM

## 2015-09-30 MED ORDER — QUETIAPINE FUMARATE 100 MG PO TABS: 50.0000 mg | ORAL_TABLET | Freq: Every day | ORAL | Status: DC

## 2015-09-30 MED ORDER — DIVALPROEX SODIUM ER 500 MG PO TB24: 500.0000 mg | ORAL_TABLET | Freq: Every day | ORAL | Status: DC

## 2015-09-30 MED ORDER — CLONAZEPAM 0.5 MG PO TABS: 0.5000 mg | ORAL_TABLET | Freq: Two times a day (BID) | ORAL | Status: DC | PRN

## 2015-09-30 NOTE — Assessment & Plan Note (Signed)
Describes decreased attention span resulting in difficulty in her completing work. Previously medicated with Vyvanse with no formal diagnosis of ADHD/ADD. Refer to psychology for evaluation and testing to determine status. Decreased attention may be resulting in increased anxiety.

## 2015-09-30 NOTE — Progress Notes (Signed)
Subjective:    Patient ID: Tracy Valenzuela, female    DOB: [birth year], 40 y.o.   MRN: [id number]  Chief Complaint  Patient presents with  . Medication Refill    wants to get back on klonopin and wants to talk about getting back on her bipolar medications    HPI:  Tracy Valenzuela is a 40 y.o. female who  has a past medical history of Asthma; Bipolar 1 disorder ([place]); Prediabetes; Heart murmur; ASCUS with positive high risk HPV (11/2014); and LGSIL (low grade squamous intraepithelial dysplasia) (11/2014). and presents today for a follow up office visit.   1.) Bipolar - Previously diagnosed with Bipolar and has not been on medications for the past couple of years as she was wanting to see if adding structure in her life would be able to maintain her symptoms. Recently had increased symptoms of lows and has had decreased amounts of sleep. Does have racing thoughts, but denies any hallucinations. Does have the occasional sucidial ideation without plan. Has been slightly tearful lately.   2.) Anxiety - Currently maintained on clonazepam. Has not taken the medication in several months and has noted increased level of anxiety in the past. Expresses that she has had decreased ability to focus recently which is also increasing her anxiety. Previously maintained on Vyvanse.    Allergies  Allergen Reactions  . Penicillins Hives    No current outpatient prescriptions on file prior to visit.   No current facility-administered medications on file prior to visit.    Past Medical History  Diagnosis Date  . Asthma   . Bipolar 1 disorder ([place])   . Prediabetes   . Heart murmur   . ASCUS with positive high risk HPV 11/2014  . LGSIL (low grade squamous intraepithelial dysplasia) 11/2014    colposcopy biopsy.  recommend follow up pap in one year     Past Surgical History  Procedure Laterality Date  . Tubal ligation    . Cardiac catheterization      Review of Systems  Constitutional: Negative for  fever and chills.  Respiratory: Negative for chest tightness and shortness of breath.   Cardiovascular: Negative for chest pain, palpitations and leg swelling.  Psychiatric/Behavioral: Positive for suicidal ideas, sleep disturbance, dysphoric mood and decreased concentration. Negative for hallucinations, behavioral problems, self-injury and agitation. The patient is nervous/anxious.       Objective:    BP 104/68 mmHg  Pulse 74  Temp(Src) 98 F (36.7 C) (Oral)  Resp 16  Ht 5\' 7"  (1.702 m)  Wt 251 lb (113.853 kg)  BMI 39.30 kg/m2  SpO2 98% Nursing note and vital signs reviewed.  Physical Exam  Constitutional: She is oriented to person, place, and time. She appears well-developed and well-nourished. No distress.  Cardiovascular: Normal rate, regular rhythm, normal heart sounds and intact distal pulses.   Pulmonary/Chest: Effort normal and breath sounds normal.  Neurological: She is alert and oriented to person, place, and time.  Skin: Skin is warm and dry.  Psychiatric: Her speech is normal and behavior is normal. Judgment and thought content normal. Her mood appears anxious. Thought content is not delusional. Cognition and memory are normal. She does not express impulsivity or inappropriate judgment. She exhibits a depressed mood.       Assessment & Plan:   Problem List Items Addressed This Visit      Other   Anxiety and depression    Anxiety and depression most likely related to decreased concentration and bipolar. Restart clonazepam  as needed for anxiety. Discussed importance of using medication as needed infrequently possible. Follow-up in one month or sooner if needed.      Relevant Medications   clonazePAM (KLONOPIN) 0.5 MG tablet   Bipolar 1 disorder, mixed, moderate ([facility]) - Primary    Previously diagnosed with bipolar and maintained on Seroquel in Vyvanse per patient. Discussed risks, side effects, and proper usage of medication. Start Seroquel and Depakote. If symptoms  worsen or do not improve seek further care. Depakote level in one month. Does express some suicidal ideations with no plan. Instructed to seek further emergency care if her thoughts worsen or increase in frequency.      Relevant Medications   QUEtiapine (SEROQUEL) 100 MG tablet   divalproex (DEPAKOTE ER) 500 MG 24 hr tablet   Decreased attention Span    Describes decreased attention span resulting in difficulty in her completing work. Previously medicated with Vyvanse with no formal diagnosis of ADHD/ADD. Refer to psychology for evaluation and testing to determine status. Decreased attention may be resulting in increased anxiety.      Relevant Orders   Ambulatory referral to Psychology       I have discontinued Ms. Knightly's levofloxacin, chlorpheniramine-HYDROcodone, benzonatate, and PARoxetine. I am also having her start on QUEtiapine and divalproex. Additionally, I am having her maintain her clonazePAM.   Meds ordered this encounter  Medications  . QUEtiapine (SEROQUEL) 100 MG tablet    Sig: Take 0.5-1 tablets (50-100 mg total) by mouth at bedtime.    Dispense:  30 tablet    Refill:  0    Order Specific Question:  Supervising Provider    Answer:  Pricilla Holm A [id number]  . divalproex (DEPAKOTE ER) 500 MG 24 hr tablet    Sig: Take 1 tablet (500 mg total) by mouth daily.    Dispense:  30 tablet    Refill:  0    Order Specific Question:  Supervising Provider    Answer:  Pricilla Holm A [id number]  . clonazePAM (KLONOPIN) 0.5 MG tablet    Sig: Take 1 tablet (0.5 mg total) by mouth 2 (two) times daily as needed. for anxiety    Dispense:  30 tablet    Refill:  0    Order Specific Question:  Supervising Provider    Answer:  Pricilla Holm A [id number]     Follow-up: Return in about 1 month (around 10/30/2015).  Mauricio Po, FNP

## 2015-09-30 NOTE — Assessment & Plan Note (Signed)
Anxiety and depression most likely related to decreased concentration and bipolar. Restart clonazepam as needed for anxiety. Discussed importance of using medication as needed infrequently possible. Follow-up in one month or sooner if needed.

## 2015-09-30 NOTE — Patient Instructions (Addendum)
Thank you for choosing [facility].  Summary/Instructions:  Please start taking the medication as prescribed.   They will call to schedule your appointment with psychology.  Your prescription(s) have been submitted to your pharmacy or been printed and provided for you. Please take as directed and contact our office if you believe you are having problem(s) with the medication(s) or have any questions.  Please stop by the lab on the basement level of the building for your blood work. Your results will be released to [place] (or called to you) after review, usually within 72 hours after test completion. If any changes need to be made, you will be notified at that same time.  If your symptoms worsen or fail to improve, please contact our office for further instruction, or in case of emergency go directly to the emergency room at the closest medical facility.

## 2015-09-30 NOTE — Assessment & Plan Note (Addendum)
Previously diagnosed with bipolar and maintained on Seroquel in Vyvanse per patient. Worsening of mood recently. Discussed risks, side effects, and proper usage of medication. Start Seroquel and Depakote. If symptoms worsen or do not improve seek further care. Depakote level in one month. Does express some suicidal ideations with no plan. Instructed to seek further emergency care if her thoughts worsen or increase in frequency.

## 2015-09-30 NOTE — Progress Notes (Signed)
Pre visit review using our clinic review tool, if applicable. No additional management support is needed unless otherwise documented below in the visit note. 

## 2015-11-07 ENCOUNTER — Telehealth: Payer: Self-pay

## 2015-11-07 DIAGNOSIS — F3162 Bipolar disorder, current episode mixed, moderate: Secondary | ICD-10-CM

## 2015-11-07 MED ORDER — QUETIAPINE FUMARATE 100 MG PO TABS: 50.0000 mg | ORAL_TABLET | Freq: Every day | ORAL | Status: DC

## 2015-11-07 NOTE — Telephone Encounter (Signed)
Pt lm on triage for rf of seraquil.

## 2015-11-09 ENCOUNTER — Other Ambulatory Visit (INDEPENDENT_AMBULATORY_CARE_PROVIDER_SITE_OTHER): Payer: 59

## 2015-11-09 ENCOUNTER — Encounter: Payer: Self-pay | Admitting: Family

## 2015-11-09 ENCOUNTER — Ambulatory Visit (INDEPENDENT_AMBULATORY_CARE_PROVIDER_SITE_OTHER): Payer: 59 | Admitting: Family

## 2015-11-09 ENCOUNTER — Encounter: Payer: 59 | Admitting: Women's Health

## 2015-11-09 VITALS — BP 122/70 | HR 72 | Temp 98.0°F | Resp 16 | Ht 67.0 in | Wt 257.0 lb

## 2015-11-09 DIAGNOSIS — E669 Obesity, unspecified: Secondary | ICD-10-CM | POA: Diagnosis not present

## 2015-11-09 DIAGNOSIS — F418 Other specified anxiety disorders: Secondary | ICD-10-CM | POA: Diagnosis not present

## 2015-11-09 DIAGNOSIS — D509 Iron deficiency anemia, unspecified: Secondary | ICD-10-CM

## 2015-11-09 DIAGNOSIS — F329 Major depressive disorder, single episode, unspecified: Secondary | ICD-10-CM

## 2015-11-09 DIAGNOSIS — F3162 Bipolar disorder, current episode mixed, moderate: Secondary | ICD-10-CM | POA: Diagnosis not present

## 2015-11-09 DIAGNOSIS — Z0289 Encounter for other administrative examinations: Secondary | ICD-10-CM

## 2015-11-09 DIAGNOSIS — F419 Anxiety disorder, unspecified: Secondary | ICD-10-CM

## 2015-11-09 DIAGNOSIS — F32A Depression, unspecified: Secondary | ICD-10-CM

## 2015-11-09 LAB — HEMOGLOBIN A1C: Hgb A1c MFr Bld: 6 % (ref 4.6–6.5)

## 2015-11-09 LAB — IBC PANEL
Iron: 30 ug/dL — ABNORMAL LOW (ref 42–145)
Saturation Ratios: 5.8 % — ABNORMAL LOW (ref 20.0–50.0)
Transferrin: 371 mg/dL — ABNORMAL HIGH (ref 212.0–360.0)

## 2015-11-09 LAB — CBC
HCT: 26.5 % — ABNORMAL LOW (ref 36.0–46.0)
Hemoglobin: 8.5 g/dL — ABNORMAL LOW (ref 12.0–15.0)
MCHC: 32.1 g/dL (ref 30.0–36.0)
MCV: 69.8 fl — ABNORMAL LOW (ref 78.0–100.0)
Platelets: 378 10*3/uL (ref 150.0–400.0)
RBC: 3.8 Mil/uL — ABNORMAL LOW (ref 3.87–5.11)
RDW: 19.6 % — ABNORMAL HIGH (ref 11.5–15.5)
WBC: 6.7 10*3/uL (ref 4.0–10.5)

## 2015-11-09 MED ORDER — QUETIAPINE FUMARATE 100 MG PO TABS: 50.0000 mg | ORAL_TABLET | Freq: Every day | ORAL | Status: DC

## 2015-11-09 MED ORDER — CLONAZEPAM 0.5 MG PO TABS: 0.5000 mg | ORAL_TABLET | Freq: Two times a day (BID) | ORAL | Status: DC | PRN

## 2015-11-09 NOTE — Assessment & Plan Note (Signed)
Symptoms are improved with iron intake. Obtain CBC and IBC panel. Anemia is related to menstrual blood loss and will most likely require this for ultimate correction. She will continue discussion with GYN. Continue current dosage of ferrous sulfate. Follow up pending blood work.

## 2015-11-09 NOTE — Progress Notes (Signed)
Subjective:    Patient ID: Tracy Valenzuela, female    DOB: 1976-03-07, 40 y.o.   MRN: [id number]  Chief Complaint  Patient presents with  . Follow-up    needs another medication in place of depakote insurance would not cover    HPI:  Tracy Valenzuela is a 40 y.o. female who  has a past medical history of Asthma; Bipolar 1 disorder ([place]); Prediabetes; Heart murmur; ASCUS with positive high risk HPV (11/2014); and LGSIL (low grade squamous intraepithelial dysplasia) (11/2014). and presents today for a follow up office visit.   1.) Bipolar - Currently prescribed Seroquel and Depakote. Reports that she has not taken the depatkote secodnary to lack of insureance coverage. Does take the Seroquel as prescribed with no adverse side effects. Averaging about 7-8 hours with the medication. Reports that her mood is stable. Endorses that her anxiety well controlled with the clonazepam. She takes it on a daily basis and friends/family/co-workers note a significant improvement.  Allergies  Allergen Reactions  . Penicillins Hives    2.) Iron deficiency - Currently maintained on ferrous sulfate. Reports taking medication as prescribed with occasional constipation. Modifying factors include Metamucil which helps to keep the constipation under control. Does note some improvement in previous symptoms.   Outpatient Prescriptions Prior to Visit  Medication Sig Dispense Refill  . clonazePAM (KLONOPIN) 0.5 MG tablet Take 1 tablet (0.5 mg total) by mouth 2 (two) times daily as needed. for anxiety 30 tablet 0  . divalproex (DEPAKOTE ER) 500 MG 24 hr tablet Take 1 tablet (500 mg total) by mouth daily. 30 tablet 0  . QUEtiapine (SEROQUEL) 100 MG tablet Take 0.5-1 tablets (50-100 mg total) by mouth at bedtime. 90 tablet 1   No facility-administered medications prior to visit.    Past Surgical History  Procedure Laterality Date  . Tubal ligation    . Cardiac catheterization        Review of Systems    Constitutional: Negative for fever and chills.  Respiratory: Negative for chest tightness, shortness of breath and wheezing.   Cardiovascular: Negative for chest pain, palpitations and leg swelling.  Psychiatric/Behavioral: Negative for suicidal ideas, hallucinations, sleep disturbance, dysphoric mood and agitation. The patient is not nervous/anxious and is not hyperactive.       Objective:    BP 122/70 mmHg  Pulse 72  Temp(Src) 98 F (36.7 C) (Oral)  Resp 16  Ht 5\' 7"  (1.702 m)  Wt 257 lb (116.574 kg)  BMI 40.24 kg/m2  SpO2 95% Nursing note and vital signs reviewed.  Physical Exam  Constitutional: She is oriented to person, place, and time. She appears well-developed and well-nourished. No distress.  Cardiovascular: Normal rate, regular rhythm, normal heart sounds and intact distal pulses.   Pulmonary/Chest: Effort normal and breath sounds normal.  Neurological: She is alert and oriented to person, place, and time.  Skin: Skin is warm and dry. There is pallor.  Psychiatric: She has a normal mood and affect. Her behavior is normal. Judgment and thought content normal.       Assessment & Plan:   Problem List Items Addressed This Visit      Other   Iron deficiency anemia    Symptoms are improved with iron intake. Obtain CBC and IBC panel. Anemia is related to menstrual blood loss and will most likely require this for ultimate correction. She will continue discussion with GYN. Continue current dosage of ferrous sulfate. Follow up pending blood work.  Relevant Orders   IBC panel (Completed)   CBC (Completed)   Anxiety and depression - Primary    Anxiety improved with clonazepam with no adverse side effects. [place] Controlled Substance Database reviewed with no irregularities. Continue to take clonazepam as prescribed.       Relevant Medications   clonazePAM (KLONOPIN) 0.5 MG tablet   Bipolar 1 disorder, mixed, moderate (HCC)    Bipolar with improved symptoms since  starting quetiapine. Unable to start Depakote secondary to insurance coverage. No set her mood is stable and family/friends have no some improvements and stabilization in her moods. No adverse side effects. Sleeping approximately 7-8 hours per night. No major symptoms of depression or mania. Continue current dosage of quetiapine.      Relevant Medications   QUEtiapine (SEROQUEL) 100 MG tablet   Morbid obesity (HCC)    BMI of 40 indicating morbid obesity. Obtain A1c to rule out underlying diabetes. Recommend weight loss of approximately 5-10% of current body weight through nutrition and physical activity.Recommend increasing physical activity to 30 minutes of moderate level activity daily. Encourage nutritional intake that focuses on nutrient dense foods and is moderate, varied, and balanced and is low in saturated fats and processed/sugary foods. Continue to monitor.      Relevant Orders   Hemoglobin A1c (Completed)       I have discontinued Ms. Mcphillips's divalproex. I am also having her maintain her clonazePAM and QUEtiapine.   Meds ordered this encounter  Medications  . clonazePAM (KLONOPIN) 0.5 MG tablet    Sig: Take 1 tablet (0.5 mg total) by mouth 2 (two) times daily as needed. for anxiety    Dispense:  30 tablet    Refill:  1    Order Specific Question:  Supervising Provider    Answer:  Pricilla Holm A [id number]  . QUEtiapine (SEROQUEL) 100 MG tablet    Sig: Take 0.5-1 tablets (50-100 mg total) by mouth at bedtime.    Dispense:  90 tablet    Refill:  1    Order Specific Question:  Supervising Provider    Answer:  Pricilla Holm A [id number]     Follow-up: Return in about 3 months (around 02/09/2016), or if symptoms worsen or fail to improve.  Mauricio Po, FNP

## 2015-11-09 NOTE — Assessment & Plan Note (Signed)
BMI of 40 indicating morbid obesity. Obtain A1c to rule out underlying diabetes. Recommend weight loss of approximately 5-10% of current body weight through nutrition and physical activity.Recommend increasing physical activity to 30 minutes of moderate level activity daily. Encourage nutritional intake that focuses on nutrient dense foods and is moderate, varied, and balanced and is low in saturated fats and processed/sugary foods. Continue to monitor.

## 2015-11-09 NOTE — Patient Instructions (Signed)
Thank you for choosing [facility].  Summary/Instructions:  Please continue to take the clonazepam and Seroquel as prescribed.   Your prescription(s) have been submitted to your pharmacy or been printed and provided for you. Please take as directed and contact our office if you believe you are having problem(s) with the medication(s) or have any questions.  Please stop by the lab on the lower level of the building for your blood work. Your results will be released to [place] (or called to you) after review, usually within 72 hours after test completion. If any changes need to be made, you will be notified at that same time.  1. The lab is open from 7:30am to 5:30 pm Monday-Friday  2. No appointment is necessary  3. Fasting (if needed) is 6-8 hours after food and drink; black coffee  and water are okay   If your symptoms worsen or fail to improve, please contact our office for further instruction, or in case of emergency go directly to the emergency room at the closest medical facility.

## 2015-11-09 NOTE — Assessment & Plan Note (Signed)
Anxiety improved with clonazepam with no adverse side effects. [place] Controlled Substance Database reviewed with no irregularities. Continue to take clonazepam as prescribed.

## 2015-11-09 NOTE — Assessment & Plan Note (Signed)
Bipolar with improved symptoms since starting quetiapine. Unable to start Depakote secondary to insurance coverage. No set her mood is stable and family/friends have no some improvements and stabilization in her moods. No adverse side effects. Sleeping approximately 7-8 hours per night. No major symptoms of depression or mania. Continue current dosage of quetiapine.

## 2015-11-24 ENCOUNTER — Inpatient Hospital Stay (HOSPITAL_COMMUNITY): Payer: 59

## 2015-11-24 ENCOUNTER — Telehealth: Payer: Self-pay | Admitting: Family

## 2015-11-24 ENCOUNTER — Ambulatory Visit (INDEPENDENT_AMBULATORY_CARE_PROVIDER_SITE_OTHER): Payer: 59 | Admitting: Family

## 2015-11-24 ENCOUNTER — Inpatient Hospital Stay (HOSPITAL_COMMUNITY)
Admission: AD | Admit: 2015-11-24 | Discharge: 2015-11-24 | Discharge status: Against Medical Advice | Payer: 59 | Source: Ambulatory Visit | Attending: Obstetrics and Gynecology | Admitting: Obstetrics and Gynecology

## 2015-11-24 ENCOUNTER — Encounter: Payer: Self-pay | Admitting: Family

## 2015-11-24 VITALS — BP 128/78 | HR 76 | Temp 98.4°F | Resp 16 | Ht 67.0 in | Wt 253.0 lb

## 2015-11-24 DIAGNOSIS — N939 Abnormal uterine and vaginal bleeding, unspecified: Secondary | ICD-10-CM | POA: Diagnosis not present

## 2015-11-24 DIAGNOSIS — Z5321 Procedure and treatment not carried out due to patient leaving prior to being seen by health care provider: Secondary | ICD-10-CM | POA: Diagnosis not present

## 2015-11-24 DIAGNOSIS — R1031 Right lower quadrant pain: Secondary | ICD-10-CM | POA: Diagnosis not present

## 2015-11-24 DIAGNOSIS — N938 Other specified abnormal uterine and vaginal bleeding: Secondary | ICD-10-CM

## 2015-11-24 DIAGNOSIS — R42 Dizziness and giddiness: Secondary | ICD-10-CM | POA: Diagnosis not present

## 2015-11-24 LAB — URINALYSIS, ROUTINE W REFLEX MICROSCOPIC
Bilirubin Urine: NEGATIVE
Glucose, UA: NEGATIVE mg/dL
Ketones, ur: NEGATIVE mg/dL
Nitrite: NEGATIVE
Protein, ur: 100 mg/dL — AB
Specific Gravity, Urine: 1.025 (ref 1.005–1.030)
pH: 6 (ref 5.0–8.0)

## 2015-11-24 LAB — CBC
HCT: 25.9 % — ABNORMAL LOW (ref 36.0–46.0)
Hemoglobin: 8 g/dL — ABNORMAL LOW (ref 12.0–15.0)
MCH: 22.4 pg — ABNORMAL LOW (ref 26.0–34.0)
MCHC: 30.9 g/dL (ref 30.0–36.0)
MCV: 72.5 fL — ABNORMAL LOW (ref 78.0–100.0)
Platelets: 438 10*3/uL — ABNORMAL HIGH (ref 150–400)
RBC: 3.57 MIL/uL — ABNORMAL LOW (ref 3.87–5.11)
RDW: 17.4 % — ABNORMAL HIGH (ref 11.5–15.5)
WBC: 4.9 10*3/uL (ref 4.0–10.5)

## 2015-11-24 LAB — URINE MICROSCOPIC-ADD ON

## 2015-11-24 LAB — POCT URINE PREGNANCY: Preg Test, Ur: NEGATIVE

## 2015-11-24 NOTE — MAU Note (Signed)
Not in lobby when called for [place]

## 2015-11-24 NOTE — Telephone Encounter (Signed)
Patient Name: LAKAYLA MCALPIN DOB: 02/01/1976 Initial Comment Caller states she's has been bleeding since the 14th. Heavy vaginal bleeding with clots. Almost like a urine stream of blood. Nurse Assessment Nurse: Roosvelt Maser, RN, Barnetta Chapel Date/Time (Eastern Time): 11/24/2015 12:05:40 PM Confirm and document reason for call. If symptomatic, describe symptoms. You must click the next button to save text entered. ---caller states heavy bleeding since the 14th Has the patient traveled out of the country within the last 30 days? ---Not Applicable Does the patient have any new or worsening symptoms? ---Yes Will a triage be completed? ---Yes Related visit to physician within the last 2 weeks? ---No Does the PT have any chronic conditions? (i.e. diabetes, asthma, etc.) ---Yes List chronic conditions. ---anemia Is the patient pregnant or possibly pregnant? (Ask all females between the ages of 14-55) ---No Is this a behavioral health or substance abuse call? ---No Guidelines Guideline Title Affirmed Question Affirmed Notes Vaginal Bleeding - Abnormal [1] Constant abdominal pain AND [2] present > 2 hours Final Disposition User See Physician within 4 Hours (or PCP triage) Roosvelt Maser, RN, Barnetta Chapel Referrals REFERRED TO PCP OFFICE Disagree/Comply: Leta Baptist

## 2015-11-24 NOTE — Assessment & Plan Note (Signed)
Symptoms and exam consistent with dysfunctional uterine bleeding. POCT pregnancy test negative. Vital signs are stable. She does appear pale. Given secondary symptoms of weakness and dizziness combined with length of bleeding time recommend follow up with [facility] for further assessment and evaluation. Patient wishes to transport by personal vehicle and appears safe to do so.

## 2015-11-24 NOTE — Patient Instructions (Signed)
Thank you for choosing [facility].  Summary/Instructions:  I am concerned regarding your blood loss given the length of time your bleeding has occurred and that your dizziness.  I am recommending you go to [facility] for further evaluation and treatment.    Dysfunctional Uterine Bleeding Dysfunctional uterine bleeding is abnormal bleeding from the uterus. Dysfunctional uterine bleeding includes:  A period that comes earlier or later than usual.  A period that is lighter, heavier, or has blood clots.  Bleeding between periods.  Skipping one or more periods.  Bleeding after sexual intercourse.  Bleeding after menopause. HOME CARE INSTRUCTIONS  Pay attention to any changes in your symptoms. Follow these instructions to help with your condition: Eating  Eat well-balanced meals. Include foods that are high in iron, such as liver, meat, shellfish, green leafy vegetables, and eggs.  If you become constipated:  Drink plenty of water.  Eat fruits and vegetables that are high in water and fiber, such as spinach, carrots, raspberries, apples, and mango. Medicines  Take over-the-counter and prescription medicines only as told by your health care provider.  Do not change medicines without talking with your health care provider.  Aspirin or medicines that contain aspirin may make the bleeding worse. Do not take those medicines:  During the week before your period.  During your period.  If you were prescribed iron pills, take them as told by your health care provider. Iron pills help to replace iron that your body loses because of this condition. Activity  If you need to change your sanitary pad or tampon more than one time every 2 hours:  Lie in bed with your feet raised (elevated).  Place a cold pack on your lower abdomen.  Rest as much as possible until the bleeding stops or slows down.  Do not try to lose weight until the bleeding has stopped and your blood  iron level is back to normal. Other Instructions  For two months, write down:  When your period starts.  When your period ends.  When any abnormal bleeding occurs.  What problems you notice.  Keep all follow up visits as told by your health care provider. This is important. SEEK MEDICAL CARE IF:  You get light-headed or weak.  You have nausea and vomiting.  You cannot eat or drink without vomiting.  You feel dizzy or have diarrhea while you are taking medicines.  You are taking birth control pills or hormones, and you want to change them or stop taking them. SEEK IMMEDIATE MEDICAL CARE IF:  You develop a fever or chills.  You need to change your sanitary pad or tampon more than one time per hour.  Your bleeding becomes heavier, or your flow contains clots more often.  You develop pain in your abdomen.  You lose consciousness.  You develop a rash.   This information is not intended to replace advice given to you by your health care provider. Make sure you discuss any questions you have with your health care provider.   Document Released: 04/13/2000 Document Revised: 01/05/2015 Document Reviewed: 07/12/2014 Elsevier Interactive Patient Education [employer].

## 2015-11-24 NOTE — MAU Note (Signed)
NOT IN LOBBY 

## 2015-11-24 NOTE — MAU Note (Signed)
Pt states she has been bleeding for 13 days, is lightheaded & dizzy, passing clots - "looks like tissue."  RLQ pain for the past 3 days, also lower back pain.  Was seen at MD office today for this issue, was told to come to MAU.  States her Hgb is low.

## 2015-11-24 NOTE — MAU Note (Signed)
Not in Lobby 

## 2015-11-24 NOTE — Progress Notes (Signed)
Subjective:    Patient ID: Tracy Valenzuela, female    DOB: 08-Jul-1975, 40 y.o.   MRN: [id number]  Chief Complaint  Patient presents with  . Menorrhagia    has been bleeding vaginally since 11/11/15, alot of lower abdominal pain, feeling dizzy and weak like she is about to pass out and the bleeding is getting heavier    HPI:  Tracy Valenzuela is a 40 y.o. female who  has a past medical history of ASCUS with positive high risk HPV (11/2014); Asthma; Bipolar 1 disorder ([place]); Heart murmur; LGSIL (low grade squamous intraepithelial dysplasia) (11/2014); and Prediabetes. and presents today for an office visit.   Menorrhagia//Dysfunctional Uterine bleeding - This is an exacerbation of a chronic problem.. Associated symptom of vaginal bleeding and heavy menses has been going on since 11/11/15 with associated lower abdominal pain and the associated feelings of  weakness and dizziness. Dizziness exacerbated by changes of position. Endorses the bleeding has been getting heavier since initial onset. Denies any factors that make it better or worse. Abdominal pain is decribed as an intense cramping. Indicates she goes through a pad every couple of hours and a tampon will last about 45 minutes.History of tubal ligation.      Allergies  Allergen Reactions  . Penicillins Hives     Current Outpatient Prescriptions on File Prior to Visit  Medication Sig Dispense Refill  . clonazePAM (KLONOPIN) 0.5 MG tablet Take 1 tablet (0.5 mg total) by mouth 2 (two) times daily as needed. for anxiety 30 tablet 1  . QUEtiapine (SEROQUEL) 100 MG tablet Take 0.5-1 tablets (50-100 mg total) by mouth at bedtime. 90 tablet 1   No current facility-administered medications on file prior to visit.     Review of Systems  Constitutional: Positive for fatigue. Negative for chills and fever.  Gastrointestinal: Positive for abdominal pain. Negative for constipation, diarrhea, nausea and vomiting.  Genitourinary: Positive for  menstrual problem and vaginal bleeding. Negative for frequency, hematuria, urgency, vaginal discharge and vaginal pain.  Neurological: Positive for dizziness and weakness.      Objective:    BP 128/78 (BP Location: Left Arm, Patient Position: Sitting, Cuff Size: Normal)   Pulse 76   Temp 98.4 F (36.9 C) (Oral)   Resp 16   Ht 5\' 7"  (1.702 m)   Wt 253 lb (114.8 kg)   LMP 11/11/2015   SpO2 98%   BMI 39.63 kg/m  Nursing note and vital signs reviewed.  Physical Exam  Constitutional: She is oriented to person, place, and time. She appears well-developed and well-nourished. No distress.  Cardiovascular: Normal rate, regular rhythm, normal heart sounds and intact distal pulses.   Pulmonary/Chest: Effort normal and breath sounds normal.  Abdominal: Normal appearance and bowel sounds are normal. She exhibits no mass. There is no hepatosplenomegaly. There is tenderness in the right lower quadrant, suprapubic area and left lower quadrant. There is no rigidity, no rebound, no guarding, no tenderness at McBurney's point and negative Murphy's sign.  Neurological: She is alert and oriented to person, place, and time.  Skin: Skin is warm and dry. There is pallor.  Psychiatric: She has a normal mood and affect. Her behavior is normal. Judgment and thought content normal.       Assessment & Plan:   Problem List Items Addressed This Visit      Other   Vaginal bleeding - Primary    Symptoms and exam consistent with dysfunctional uterine bleeding. POCT pregnancy test negative. Vital signs are  stable. She does appear pale. Given secondary symptoms of weakness and dizziness combined with length of bleeding time recommend follow up with [facility] for further assessment and evaluation. Patient wishes to transport by personal vehicle and appears safe to do so.       Relevant Orders   POCT urine pregnancy (Completed)    Other Visit Diagnoses   None.     I am having Ms. Easton maintain her  clonazePAM and QUEtiapine.   Follow-up: Return if symptoms worsen or fail to improve.  Mauricio Po, FNP

## 2015-11-25 ENCOUNTER — Other Ambulatory Visit: Payer: Self-pay | Admitting: Gynecology

## 2015-11-25 ENCOUNTER — Inpatient Hospital Stay (HOSPITAL_COMMUNITY)
Admission: AD | Admit: 2015-11-25 | Discharge: 2015-11-25 | Disposition: A | Discharge status: Home / Self Care | Payer: 59 | Source: Ambulatory Visit | Attending: Gynecology | Admitting: Gynecology

## 2015-11-25 ENCOUNTER — Encounter (HOSPITAL_COMMUNITY): Payer: Self-pay | Admitting: *Deleted

## 2015-11-25 DIAGNOSIS — N920 Excessive and frequent menstruation with regular cycle: Secondary | ICD-10-CM | POA: Diagnosis not present

## 2015-11-25 DIAGNOSIS — Z880 Allergy status to penicillin: Secondary | ICD-10-CM | POA: Insufficient documentation

## 2015-11-25 DIAGNOSIS — D649 Anemia, unspecified: Secondary | ICD-10-CM

## 2015-11-25 DIAGNOSIS — R011 Cardiac murmur, unspecified: Secondary | ICD-10-CM | POA: Insufficient documentation

## 2015-11-25 DIAGNOSIS — J45909 Unspecified asthma, uncomplicated: Secondary | ICD-10-CM | POA: Insufficient documentation

## 2015-11-25 DIAGNOSIS — Z87891 Personal history of nicotine dependence: Secondary | ICD-10-CM | POA: Insufficient documentation

## 2015-11-25 DIAGNOSIS — R7303 Prediabetes: Secondary | ICD-10-CM | POA: Insufficient documentation

## 2015-11-25 DIAGNOSIS — N939 Abnormal uterine and vaginal bleeding, unspecified: Secondary | ICD-10-CM | POA: Diagnosis not present

## 2015-11-25 DIAGNOSIS — R109 Unspecified abdominal pain: Secondary | ICD-10-CM | POA: Diagnosis not present

## 2015-11-25 DIAGNOSIS — F319 Bipolar disorder, unspecified: Secondary | ICD-10-CM | POA: Insufficient documentation

## 2015-11-25 DIAGNOSIS — N938 Other specified abnormal uterine and vaginal bleeding: Secondary | ICD-10-CM

## 2015-11-25 LAB — URINE MICROSCOPIC-ADD ON: WBC, UA: NONE SEEN WBC/hpf (ref 0–5)

## 2015-11-25 LAB — CBC
HCT: 25.5 % — ABNORMAL LOW (ref 36.0–46.0)
Hemoglobin: 7.9 g/dL — ABNORMAL LOW (ref 12.0–15.0)
MCH: 22.3 pg — ABNORMAL LOW (ref 26.0–34.0)
MCHC: 31 g/dL (ref 30.0–36.0)
MCV: 72 fL — ABNORMAL LOW (ref 78.0–100.0)
Platelets: 400 10*3/uL (ref 150–400)
RBC: 3.54 MIL/uL — ABNORMAL LOW (ref 3.87–5.11)
RDW: 17.3 % — ABNORMAL HIGH (ref 11.5–15.5)
WBC: 4.9 10*3/uL (ref 4.0–10.5)

## 2015-11-25 LAB — URINALYSIS, ROUTINE W REFLEX MICROSCOPIC
Bilirubin Urine: NEGATIVE
Glucose, UA: NEGATIVE mg/dL
Ketones, ur: NEGATIVE mg/dL
Leukocytes, UA: NEGATIVE
Nitrite: NEGATIVE
Protein, ur: 100 mg/dL — AB
Specific Gravity, Urine: >1.03 — ABNORMAL HIGH (ref 1.005–1.030)
pH: 5.5 (ref 5.0–8.0)

## 2015-11-25 LAB — TYPE AND SCREEN
ABO/RH(D): O POS
Antibody Screen: NEGATIVE

## 2015-11-25 LAB — POCT PREGNANCY, URINE: Preg Test, Ur: NEGATIVE

## 2015-11-25 LAB — ABO/RH: ABO/RH(D): O POS

## 2015-11-25 MED ORDER — MEGESTROL ACETATE 20 MG PO TABS: ORAL_TABLET | ORAL | 0 refills | Status: DC

## 2015-11-25 NOTE — Telephone Encounter (Signed)
Telephone call from Tracy Valenzuela, states is bleeding heavily passing large clots the size of golf balls - baseballs, has been bleeding since July 14. Was seen at primary care yesterday, UPT negative, H&H yesterday 8/25.92 weeks earlier 8.5/26.5. States was sent to the [facility] hospital but after 9 hours and not being seen left. States bleeding has persisted throughout the night and today. Instructed to return to the hospital, reviewed may need a blood transfusion. Reviewed after this bleeding has subsided may be best to look at having a Mirena IUD or sonohysterogram and possible ablation. Encouraged to schedule annual exam

## 2015-11-25 NOTE — MAU Note (Signed)
C/o heavy vaginal bleeding since the 15th of this month; having baseball size clots and is bright red; concerned for anemia since she is feeling weak; has had near syncope episodes;

## 2015-11-25 NOTE — Discharge Instructions (Signed)
Increase iron supplement to 2 pills, 3 times per day     Abnormal Uterine Bleeding Abnormal uterine bleeding can affect women at various stages in life, including teenagers, women in their reproductive years, pregnant women, and women who have reached menopause. Several kinds of uterine bleeding are considered abnormal, including:  Bleeding or spotting between periods.   Bleeding after sexual intercourse.   Bleeding that is heavier or more than normal.   Periods that last longer than usual.  Bleeding after menopause.  Many cases of abnormal uterine bleeding are minor and simple to treat, while others are more serious. Any type of abnormal bleeding should be evaluated by your health care provider. Treatment will depend on the cause of the bleeding. HOME CARE INSTRUCTIONS Monitor your condition for any changes. The following actions may help to alleviate any discomfort you are experiencing:  Avoid the use of tampons and douches as directed by your health care provider.  Change your pads frequently. You should get regular pelvic exams and Pap tests. Keep all follow-up appointments for diagnostic tests as directed by your health care provider.  SEEK MEDICAL CARE IF:   Your bleeding lasts more than 1 week.   You feel dizzy at times.  SEEK IMMEDIATE MEDICAL CARE IF:   You pass out.   You are changing pads every 15 to 30 minutes.   You have abdominal pain.  You have a fever.   You become sweaty or weak.   You are passing large blood clots from the vagina.   You start to feel nauseous and vomit. MAKE SURE YOU:   Understand these instructions.  Will watch your condition.  Will get help right away if you are not doing well or get worse.   This information is not intended to replace advice given to you by your health care provider. Make sure you discuss any questions you have with your health care provider.   Document Released: 04/16/2005 Document Revised:  04/21/2013 Document Reviewed: 11/13/2012 Elsevier Interactive Patient Education 2016 [employer].   Anemia, Nonspecific Anemia is a condition in which the concentration of red blood cells or hemoglobin in the blood is below normal. Hemoglobin is a substance in red blood cells that carries oxygen to the tissues of the body. Anemia results in not enough oxygen reaching these tissues.  CAUSES  Common causes of anemia include:   Excessive bleeding. Bleeding may be internal or external. This includes excessive bleeding from periods (in women) or from the intestine.   Poor nutrition.   Chronic kidney, thyroid, and liver disease.  Bone marrow disorders that decrease red blood cell production.  Cancer and treatments for cancer.  HIV, AIDS, and their treatments.  Spleen problems that increase red blood cell destruction.  Blood disorders.  Excess destruction of red blood cells due to infection, medicines, and autoimmune disorders. SIGNS AND SYMPTOMS   Minor weakness.   Dizziness.   Headache.  Palpitations.   Shortness of breath, especially with exercise.   Paleness.  Cold sensitivity.  Indigestion.  Nausea.  Difficulty sleeping.  Difficulty concentrating. Symptoms may occur suddenly or they may develop slowly.  DIAGNOSIS  Additional blood tests are often needed. These help your health care provider determine the best treatment. Your health care provider will check your stool for blood and look for other causes of blood loss.  TREATMENT  Treatment varies depending on the cause of the anemia. Treatment can include:   Supplements of iron, vitamin B34, or folic acid.  Hormone medicines.   A blood transfusion. This may be needed if blood loss is severe.   Hospitalization. This may be needed if there is significant continual blood loss.   Dietary changes.  Spleen removal. HOME CARE INSTRUCTIONS Keep all follow-up appointments. It often takes many weeks  to correct anemia, and having your health care provider check on your condition and your response to treatment is very important. SEEK IMMEDIATE MEDICAL CARE IF:   You develop extreme weakness, shortness of breath, or chest pain.   You become dizzy or have trouble concentrating.  You develop heavy vaginal bleeding.   You develop a rash.   You have bloody or black, tarry stools.   You faint.   You vomit up blood.   You vomit repeatedly.   You have abdominal pain.  You have a fever or persistent symptoms for more than 2-3 days.   You have a fever and your symptoms suddenly get worse.   You are dehydrated.  MAKE SURE YOU:  Understand these instructions.  Will watch your condition.  Will get help right away if you are not doing well or get worse.   This information is not intended to replace advice given to you by your health care provider. Make sure you discuss any questions you have with your health care provider.   Document Released: 05/24/2004 Document Revised: 12/17/2012 Document Reviewed: 10/10/2012 Elsevier Interactive Patient Education 2016 [employer].    Iron-Rich Diet Iron is a mineral that helps your body to produce hemoglobin. Hemoglobin is a protein in your red blood cells that carries oxygen to your body's tissues. Eating too little iron may cause you to feel weak and tired, and it can increase your risk for infection. Eating enough iron is necessary for your body's metabolism, muscle function, and nervous system. Iron is naturally found in many foods. It can also be added to foods or fortified in foods. There are two types of dietary iron:  Heme iron. Heme iron is absorbed by the body more easily than nonheme iron. Heme iron is found in meat, poultry, and fish.  Nonheme iron. Nonheme iron is found in dietary supplements, iron-fortified grains, beans, and vegetables. You may need to follow an iron-rich diet if:  You have been diagnosed with  iron deficiency or iron-deficiency anemia.  You have a condition that prevents you from absorbing dietary iron, such as:  Infection in your intestines.  Celiac disease. This involves long-lasting (chronic) inflammation of your intestines.  You do not eat enough iron.  You eat a diet that is high in foods that impair iron absorption.  You have lost a lot of blood.  You have heavy bleeding during your menstrual cycle.  You are pregnant. WHAT IS MY PLAN? Your health care provider may help you to determine how much iron you need per day based on your condition. Generally, when a person consumes sufficient amounts of iron in the diet, the following iron needs are met:  Men.  29-77 years old: 11 mg per day.  69-79 years old: 8 mg per day.  Women.   64-75 years old: 15 mg per day.  64-49 years old: 18 mg per day.  Over 59 years old: 8 mg per day.  Pregnant women: 27 mg per day.  Breastfeeding women: 9 mg per day. WHAT DO I NEED TO KNOW ABOUT AN IRON-RICH DIET?  Eat fresh fruits and vegetables that are high in vitamin C along with foods that are high in  iron. This will help increase the amount of iron that your body absorbs from food, especially with foods containing nonheme iron. Foods that are high in vitamin C include oranges, peppers, tomatoes, and mango.  Take iron supplements only as directed by your health care provider. Overdose of iron can be life-threatening. If you were prescribed iron supplements, take them with orange juice or a vitamin C supplement.  Cook foods in pots and pans that are made from iron.   Eat nonheme iron-containing foods alongside foods that are high in heme iron. This helps to improve your iron absorption.   Certain foods and drinks contain compounds that impair iron absorption. Avoid eating these foods in the same meal as iron-rich foods or with iron supplements. These include:  Coffee, black tea, and red wine.  Milk, dairy products, and  foods that are high in calcium.  Beans, soybeans, and peas.  Whole grains.  When eating foods that contain both nonheme iron and compounds that impair iron absorption, follow these tips to absorb iron better.   Soak beans overnight before cooking.  Soak whole grains overnight and drain them before using.  Ferment flours before baking, such as using yeast in bread dough. WHAT FOODS CAN I EAT? Grains Iron-fortified breakfast cereal. Iron-fortified whole-wheat bread. Enriched rice. Sprouted grains. Vegetables Spinach. Potatoes with skin. Green peas. Broccoli. Red and green bell peppers. Fermented vegetables. Fruits Prunes. Raisins. Oranges. Strawberries. Mango. Grapefruit. Meats and Other Protein Sources Beef liver. Oysters. Beef. Shrimp. [place]. Chicken. [place]. Sardines. Chickpeas. Nuts. Tofu. Beverages Tomato juice. Fresh orange juice. Prune juice. Hibiscus tea. Fortified instant breakfast shakes. Condiments Tahini. Fermented soy sauce. Sweets and Desserts Black-strap molasses.  Other Wheat germ. The items listed above may not be a complete list of recommended foods or beverages. Contact your dietitian for more options. WHAT FOODS ARE NOT RECOMMENDED? Grains Whole grains. Bran cereal. Bran flour. Oats. Vegetables Artichokes. Brussels sprouts. Kale. Fruits Blueberries. Raspberries. Strawberries. Figs. Meats and Other Protein Sources Soybeans. Products made from soy protein. Dairy Milk. Cream. Cheese. Yogurt. Cottage cheese. Beverages Coffee. Black tea. Red wine. Sweets and Desserts Cocoa. Chocolate. Ice cream. Other Basil. Oregano. Parsley. The items listed above may not be a complete list of foods and beverages to avoid. Contact your dietitian for more information.   This information is not intended to replace advice given to you by your health care provider. Make sure you discuss any questions you have with your health care provider.   Document  Released: 11/28/2004 Document Revised: 05/07/2014 Document Reviewed: 11/11/2013 Elsevier Interactive Patient Education [employer].

## 2015-11-25 NOTE — MAU Provider Note (Signed)
History     CSN: [id number]  Arrival date and time: 11/25/15 1209   First Provider Initiated Contact with Patient 11/25/15 1253    Chief Complaint  Patient presents with  . Vaginal Bleeding   HPI  Tracy Valenzuela is a 40 y.o. female who presents for abnormal uterine bleeding. Reports history of anemia & heavy periods. Menses normally last 5 days, though they are "very heavy". Currently has been bleeding since 7/14. Reports heavy bleeding & passing baseball sized clots daily. Wears 2 pads at a time and has to change them every 2 hours when they are full. Takes iron supplement for her anemia. Increase symptoms of fatigue, weakness, and dizziness since current bleeding started. Denies chest pain, SOB, or palpitations.  Bleeding associated with abdominal cramping. Reports generalized abdominal pain that is cramp like and constant. Rates pain 7/10. Took hydrocodone last night with moderate relief. Has tried tylenol & aleve without relief.  Went to her PCP yesterday who sent her to MAU for evaluation but patient left d/t long wait. Called her gyn office today ([place]) and was told to come back to MAU.   OB History    Gravida Para Term Preterm AB Living   3 1   1 1 3    SAB TAB Ectopic Multiple Live Births   1   0          Obstetric Comments   Had a set of twins that were preterm (30 weeks)      Past Medical History:  Diagnosis Date  . ASCUS with positive high risk HPV 11/2014  . Asthma   . Bipolar 1 disorder ([place])   . Heart murmur   . LGSIL (low grade squamous intraepithelial dysplasia) 11/2014   colposcopy biopsy.  recommend follow up pap in one year  . Prediabetes     Past Surgical History:  Procedure Laterality Date  . CARDIAC CATHETERIZATION    . TUBAL LIGATION      Family History  Problem Relation Age of Onset  . Lupus Mother   . Heart disease Mother   . Diabetes Maternal Grandmother   . Heart disease Maternal Grandmother   . Hypertension Maternal Grandmother   .  Prostate cancer Maternal Grandfather   . Alzheimer's disease Paternal Grandfather     Social History  Substance Use Topics  . Smoking status: Former Smoker    Types: Cigarettes    Quit date: 04/30/2014  . Smokeless tobacco: Never Used  . Alcohol use 0.0 oz/week     Comment: occasionally    Allergies:  Allergies  Allergen Reactions  . Penicillins Hives    Prescriptions Prior to Admission  Medication Sig Dispense Refill Last Dose  . clonazePAM (KLONOPIN) 0.5 MG tablet Take 1 tablet (0.5 mg total) by mouth 2 (two) times daily as needed. for anxiety 30 tablet 1 Taking  . QUEtiapine (SEROQUEL) 100 MG tablet Take 0.5-1 tablets (50-100 mg total) by mouth at bedtime. 90 tablet 1 Taking    Review of Systems  Constitutional: Positive for malaise/fatigue. Negative for chills and fever.  Respiratory: Negative for shortness of breath.   Cardiovascular: Negative for chest pain and palpitations.  Gastrointestinal: Positive for abdominal pain. Negative for constipation, diarrhea, nausea and vomiting.  Genitourinary: Negative for dysuria.       + vaginal bleeding  Neurological: Positive for dizziness and weakness. Negative for loss of consciousness and headaches.   Physical Exam   Blood pressure 124/64, pulse 74, temperature 98 F (36.7 C), temperature  source Oral, resp. rate 16, last menstrual period 11/11/2015.  Orthostatic VS for the past 24 hrs:  BP- Lying Pulse- Lying BP- Sitting Pulse- Sitting BP- Standing at 0 minutes Pulse- Standing at 0 minutes  11/25/15 1328 122/68 63 111/63 71 117/64 78    Physical Exam  Nursing note and vitals reviewed. Constitutional: She is oriented to person, place, and time. She appears well-developed and well-nourished. No distress.  HENT:  Head: Normocephalic and atraumatic.  Eyes: Conjunctivae are normal. Right eye exhibits no discharge. Left eye exhibits no discharge. No scleral icterus.  Neck: Normal range of motion.  Cardiovascular: Normal rate,  regular rhythm and normal heart sounds.   No murmur heard. Respiratory: Effort normal and breath sounds normal. No respiratory distress. She has no wheezes.  GI: Soft. Bowel sounds are normal. She exhibits no distension. There is generalized tenderness (generalized abdominal tenderness, worse in LLQ). There is no rigidity and no guarding.  Genitourinary: Uterus is enlarged. Cervix exhibits no motion tenderness. Right adnexum displays no mass, no tenderness and no fullness. Left adnexum displays no mass, no tenderness and no fullness.  Genitourinary Comments: Small amount of dark red blood slowly oozing from cervical os  Cervix closed  Neurological: She is alert and oriented to person, place, and time.  Skin: Skin is warm and dry. She is not diaphoretic.  Psychiatric: She has a normal mood and affect. Her behavior is normal. Judgment and thought content normal.    MAU Course  Procedures Results for orders placed or performed during the hospital encounter of 11/25/15 (from the past 24 hour(s))  Urinalysis, Routine w reflex microscopic (not at [facility])     Status: Abnormal   Collection Time: 11/25/15 12:20 PM  Result Value Ref Range   Color, Urine RED (A) YELLOW   APPearance CLOUDY (A) CLEAR   Specific Gravity, Urine >1.030 (H) 1.005 - 1.030   pH 5.5 5.0 - 8.0   Glucose, UA NEGATIVE NEGATIVE mg/dL   Hgb urine dipstick LARGE (A) NEGATIVE   Bilirubin Urine NEGATIVE NEGATIVE   Ketones, ur NEGATIVE NEGATIVE mg/dL   Protein, ur 100 (A) NEGATIVE mg/dL   Nitrite NEGATIVE NEGATIVE   Leukocytes, UA NEGATIVE NEGATIVE  Urine microscopic-add on     Status: Abnormal   Collection Time: 11/25/15 12:20 PM  Result Value Ref Range   Squamous Epithelial / LPF 6-30 (A) NONE SEEN   WBC, UA NONE SEEN 0 - 5 WBC/hpf   RBC / HPF TOO NUMEROUS TO COUNT 0 - 5 RBC/hpf   Bacteria, UA FEW (A) NONE SEEN   Urine-Other URINALYSIS PERFORMED ON SUPERNATANT   Pregnancy, urine POC     Status: None   Collection Time:  11/25/15 12:45 PM  Result Value Ref Range   Preg Test, Ur NEGATIVE NEGATIVE  CBC     Status: Abnormal   Collection Time: 11/25/15 12:51 PM  Result Value Ref Range   WBC 4.9 4.0 - 10.5 K/uL   RBC 3.54 (L) 3.87 - 5.11 MIL/uL   Hemoglobin 7.9 (L) 12.0 - 15.0 g/dL   HCT 25.5 (L) 36.0 - 46.0 %   MCV 72.0 (L) 78.0 - 100.0 fL   MCH 22.3 (L) 26.0 - 34.0 pg   MCHC 31.0 30.0 - 36.0 g/dL   RDW 17.3 (H) 11.5 - 15.5 %   Platelets 400 150 - 400 K/uL  Type and screen     Status: None   Collection Time: 11/25/15 12:51 PM  Result Value Ref Range  ABO/RH(D) O POS    Antibody Screen NEG    Sample Expiration 11/28/2015   ABO/Rh     Status: None   Collection Time: 11/25/15 12:57 PM  Result Value Ref Range   ABO/RH(D) O POS     MDM UPT negative Orthostatic VS negative Hemoglobin 7.9, was 8.0 yesterday S/w Dr. Phineas Real. Reviewed history, labs, & assessment. Will discharge home with Rx Megace & increase iron supplements. Office will call her for f/u appt & ultrasound Assessment and Plan  A: 1. Abnormal uterine bleeding (AUB)   2. Anemia, unspecified anemia type   3. Abdominal cramping    P: Discharge home Rx megace 20 mg TID x 2 days followed by 20 mg BID  Increase iron supplement to 2 tabs TID Increase water intake Office should call for f/u  Jorje Guild 11/25/2015, 12:53 PM

## 2015-11-25 NOTE — MAU Note (Signed)
Was here yesterday but only had labs done- pt left AMA due to extended wait time; UPT yesterday was negative;

## 2015-11-28 ENCOUNTER — Telehealth: Payer: Self-pay | Admitting: Gynecology

## 2015-11-28 ENCOUNTER — Other Ambulatory Visit: Payer: Self-pay | Admitting: Gynecology

## 2015-11-28 DIAGNOSIS — N939 Abnormal uterine and vaginal bleeding, unspecified: Secondary | ICD-10-CM

## 2015-11-28 NOTE — Telephone Encounter (Signed)
11/28/15-I LM VM for pt to advise her that her [employer] ins puts the cost of the sonohysterogram and bx towards her unmet $3000 deductible(only $623.52 met). The allowable of $893.27 for the test and $275.02 for the bx will be her responsibility. I did say we need to hear from her today on this and to let us know what she can pay and we can set up payments for the balance.wl Per [id number] -[id number]

## 2015-11-29 ENCOUNTER — Ambulatory Visit (INDEPENDENT_AMBULATORY_CARE_PROVIDER_SITE_OTHER): Payer: 59 | Admitting: Gynecology

## 2015-11-29 ENCOUNTER — Other Ambulatory Visit: Payer: Self-pay | Admitting: Gynecology

## 2015-11-29 ENCOUNTER — Encounter: Payer: Self-pay | Admitting: Gynecology

## 2015-11-29 ENCOUNTER — Ambulatory Visit (INDEPENDENT_AMBULATORY_CARE_PROVIDER_SITE_OTHER): Payer: 59

## 2015-11-29 VITALS — BP 124/78

## 2015-11-29 DIAGNOSIS — N938 Other specified abnormal uterine and vaginal bleeding: Secondary | ICD-10-CM | POA: Diagnosis not present

## 2015-11-29 DIAGNOSIS — D251 Intramural leiomyoma of uterus: Secondary | ICD-10-CM

## 2015-11-29 DIAGNOSIS — N920 Excessive and frequent menstruation with regular cycle: Secondary | ICD-10-CM

## 2015-11-29 DIAGNOSIS — N939 Abnormal uterine and vaginal bleeding, unspecified: Secondary | ICD-10-CM

## 2015-11-29 DIAGNOSIS — D509 Iron deficiency anemia, unspecified: Secondary | ICD-10-CM

## 2015-11-29 NOTE — Patient Instructions (Signed)
Continue on the Megace daily. Continue on the iron 3 times daily. Office will call you with biopsy results. We will discuss treatment options and you'll decide how you want to proceed.

## 2015-11-29 NOTE — Progress Notes (Signed)
    Sala Draney 11/14/1975 [id number]        40 y.o.  [id number] status post BTL presents for sonohysterogram. Has a long history of heavy menses and significant anemia with hemoglobins in the 7 range over the past several years.  Recently evaluated in the ER for lightheadedness. Was started on Megace 20 mg 3 times daily analysis taper down to 1 daily. Is no longer bleeding. Hemoglobin was 7 in the emergency room. Was evaluated last year with recommended sonohysterogram but patient did not follow up for this.  Past medical history,surgical history, problem list, medications, allergies, family history and social history were all reviewed and documented in the EPIC chart.  Directed ROS with pertinent positives and negatives documented in the history of present illness/assessment and plan.  Exam: [place] assistant Vitals:   11/29/15 1554  BP: 124/78   General appearance:  Normal Abdomen soft nontender without masses guarding rebound Pelvic external BUS vagina grossly normal. Cervix normal. Uterus difficult to palpate but generous in size midline mobile nontender. Adnexa without gross masses or tenderness  Ultrasound shows uterus generous in size with several myoma measuring 43 mm, 35 mm, 19 mm. Endometrial echo 17 mm. Right and left ovaries grossly normal with physiologic changes. Cul-de-sac negative.  Sonohysterogram performed, sterile technique, easy catheter introduction, good distention with thickened endometrium but no definitive polyps or submucous myomas. Endometrial sample taken. Patient tolerated well.  Assessment/Plan:  39 y.o. [id number] status post BTL and 3 vaginal deliveries with significant menorrhagia and anemia. Currently on iron 3 times daily. Will continue on Megace 20 mg daily for now for menstrual suppression. Options for management were reviewed to include expectant management, hormonal manipulation such as daily progesterone or low-dose oral contraceptives, Mirena IUD,  endometrial ablation, hysterectomy. The pros/cons, risks/benefits of each choice reviewed with the patient at this point the patient is leaning towards definitive surgery to include hysterectomy. Uterus does appear to be well supported higher in the pelvis but does have a history of 3 vaginal deliveries. Reviewed in general possible trial of LAVH recognizing fall back on TAH. She does not have and aversion to transfusion if needed. Possibilities for Depo-Lupron suppression as a presurgical adjuvant to help with hemoglobin recovery and possibly shrink the uterus and little bit to increase success rate of the LAVH. Will further discuss after biopsy results.    Anastasio Auerbach MD, 4:24 PM 11/29/2015

## 2015-12-28 ENCOUNTER — Telehealth: Payer: Self-pay

## 2015-12-28 NOTE — Telephone Encounter (Signed)
I called [employer] prior author to check status on prior autho for patient's Lupron. It has been approved and that pharmacy has been in touch with patient. Patient has put the Rx on hold while she applies for co-payment assistance through [employer]. She evidently had a high co-payment.

## 2016-01-06 ENCOUNTER — Telehealth: Payer: Self-pay | Admitting: *Deleted

## 2016-01-06 DIAGNOSIS — F32A Depression, unspecified: Secondary | ICD-10-CM

## 2016-01-06 DIAGNOSIS — F419 Anxiety disorder, unspecified: Secondary | ICD-10-CM

## 2016-01-06 DIAGNOSIS — F329 Major depressive disorder, single episode, unspecified: Secondary | ICD-10-CM

## 2016-01-06 NOTE — Telephone Encounter (Signed)
Rec'd call pt is requesting refill on her Clonazepam.../lmb

## 2016-01-08 MED ORDER — CLONAZEPAM 0.5 MG PO TABS: 0.5000 mg | ORAL_TABLET | Freq: Two times a day (BID) | ORAL | 1 refills | Status: DC | PRN

## 2016-01-08 NOTE — Telephone Encounter (Signed)
Medication refilled

## 2016-01-09 NOTE — Telephone Encounter (Signed)
Called pt no answer LMOM rx has been faxed to [facility]...Tracy Valenzuela

## 2016-01-12 ENCOUNTER — Telehealth: Payer: Self-pay

## 2016-01-12 MED ORDER — MEGESTROL ACETATE 20 MG PO TABS: ORAL_TABLET | ORAL | 1 refills | Status: DC

## 2016-01-12 NOTE — Telephone Encounter (Signed)
I would stay on Megace 20 mg daily. If she starts to have bleeding increase it to twice daily for several days and then go back to 20 mg #60 with 1 refill

## 2016-01-12 NOTE — Telephone Encounter (Signed)
Patient called. Could not afford Lupron $500 cost.  Currently still taking Megace.  What to rec?

## 2016-01-12 NOTE — Telephone Encounter (Signed)
Left message to call me.

## 2016-01-12 NOTE — Telephone Encounter (Signed)
Patient informed. Rx sent 

## 2016-02-03 ENCOUNTER — Telehealth: Payer: Self-pay

## 2016-02-03 NOTE — Telephone Encounter (Signed)
Pt lm rq rf for clonazepam and seroquel. Left detailed message that rx for both should have refills and to contact the pharmacy for those refills.

## 2016-02-16 ENCOUNTER — Encounter: Payer: Self-pay | Admitting: Gynecology

## 2016-02-16 ENCOUNTER — Ambulatory Visit (INDEPENDENT_AMBULATORY_CARE_PROVIDER_SITE_OTHER): Payer: 59 | Admitting: Gynecology

## 2016-02-16 VITALS — BP 124/78

## 2016-02-16 DIAGNOSIS — N920 Excessive and frequent menstruation with regular cycle: Secondary | ICD-10-CM

## 2016-02-16 LAB — CBC WITH DIFFERENTIAL/PLATELET
Basophils Absolute: 42 {cells}/uL (ref 0–200)
Basophils Relative: 1 %
Eosinophils Absolute: 168 {cells}/uL (ref 15–500)
Eosinophils Relative: 4 %
HCT: 30.2 % — ABNORMAL LOW (ref 35.0–45.0)
Hemoglobin: 9.5 g/dL — ABNORMAL LOW (ref 11.7–15.5)
Lymphocytes Relative: 43 %
Lymphs Abs: 1806 {cells}/uL (ref 850–3900)
MCH: 22.5 pg — ABNORMAL LOW (ref 27.0–33.0)
MCHC: 31.5 g/dL — ABNORMAL LOW (ref 32.0–36.0)
MCV: 71.6 fL — ABNORMAL LOW (ref 80.0–100.0)
MPV: 8.3 fL (ref 7.5–12.5)
Monocytes Absolute: 462 {cells}/uL (ref 200–950)
Monocytes Relative: 11 %
Neutro Abs: 1722 {cells}/uL (ref 1500–7800)
Neutrophils Relative %: 41 %
Platelets: 376 K/uL (ref 140–400)
RBC: 4.22 MIL/uL (ref 3.80–5.10)
RDW: 18.2 % — ABNORMAL HIGH (ref 11.0–15.0)
WBC: 4.2 K/uL (ref 3.8–10.8)

## 2016-02-16 NOTE — Patient Instructions (Signed)
Office will call you with your blood count results and plans about surgery.

## 2016-02-16 NOTE — Progress Notes (Signed)
    Tracy Valenzuela 02/06/1976 [id number]        40 y.o.  [id number] presents to discuss her menorrhagia and options. Has heavy menses with bleedthrough episodes. Hemoglobin recently was 7. Currently taking Megace 20 mg daily for menstrual suppression and iron. Status post vaginal delivery 3 with tubal sterilization.  Past medical history,surgical history, problem list, medications, allergies, family history and social history were all reviewed and documented in the EPIC chart.  Directed ROS with pertinent positives and negatives documented in the history of present illness/assessment and plan.  Exam: [name] assistant Vitals:   02/16/16 0936  BP: 124/78   General appearance:  Normal Abdomen soft nontender without masses guarding rebound Pelvic external BUS vagina normal. Cervix normal. Uterus generous in size midline mobile nontender. Adnexa without masses or tenderness.  Assessment/Plan:  40 y.o. [id number] with menorrhagia and anemia. Currently on iron and menstrual suppression with Megace. Options for management were reviewed to include hormonal manipulation, Mirena IUD, ablation, hysterectomy. The pros/cons, risks/benefits of each choice reviewed. Discussed attempting a TVH bilateral salpingectomy with laparoscopic possibility up to including TAH possibility. Patient prefers a definitive solution to her bleeding and would like to proceed with hysterectomy. We'll check hemoglobin today. If significantly low then discuss possible Lupron for suppression with scheduling surgery several months from now. If adequate them will continue with Megace and move towards scheduling the surgery.  Greater than 50% of my time was spent in direct face to face counseling and coordination of care with the patient.     Anastasio Auerbach MD, 9:50 AM 02/16/2016

## 2016-03-09 ENCOUNTER — Telehealth: Payer: Self-pay

## 2016-03-09 NOTE — Telephone Encounter (Signed)
I called [facility]  To follow up on where is patient's Lupron?  I had called them 11/1 and they said authorization was in process. They told me if I did not hear from pharmacy that they automatically would follow up with them on 11/7 and let me know. I have not heard from them.  They called pharmacy while I was holding. She came make and told me the pharmacy was "sending prescription through again". Should take 3-5 days and call back if I do not hear from them.

## 2016-03-12 ENCOUNTER — Telehealth: Payer: Self-pay

## 2016-03-12 NOTE — Telephone Encounter (Signed)
I called patient to let her know I called [facility] on Friday to follow up regarding Lupron order. Later that day I received fax from [facility] that said that they are just waiting to hear from patient to okay shipment. I provided her with the phone number and she said she will call them.

## 2016-03-16 ENCOUNTER — Telehealth: Payer: Self-pay

## 2016-03-16 NOTE — Telephone Encounter (Signed)
I received a fax from [facility] to follow up with [facility] for status of Lupron. Spoke with Delsa Sale at [facility] and she said patient could not afford Lupron even with copayment assistance.  There financial assistance program is assisting her with trying to get a grant for free medication.

## 2016-04-02 ENCOUNTER — Encounter: Payer: Self-pay | Admitting: Gynecology

## 2016-04-02 ENCOUNTER — Ambulatory Visit: Payer: 59 | Admitting: Nurse Practitioner

## 2016-04-02 ENCOUNTER — Ambulatory Visit (INDEPENDENT_AMBULATORY_CARE_PROVIDER_SITE_OTHER): Payer: 59 | Admitting: Gynecology

## 2016-04-02 ENCOUNTER — Other Ambulatory Visit: Payer: Self-pay | Admitting: Gynecology

## 2016-04-02 ENCOUNTER — Telehealth: Payer: Self-pay | Admitting: *Deleted

## 2016-04-02 ENCOUNTER — Ambulatory Visit (INDEPENDENT_AMBULATORY_CARE_PROVIDER_SITE_OTHER): Payer: 59

## 2016-04-02 VITALS — BP 124/84

## 2016-04-02 DIAGNOSIS — R102 Pelvic and perineal pain: Secondary | ICD-10-CM

## 2016-04-02 DIAGNOSIS — D251 Intramural leiomyoma of uterus: Secondary | ICD-10-CM

## 2016-04-02 LAB — CBC WITH DIFFERENTIAL/PLATELET
Basophils Absolute: 0 cells/uL (ref 0–200)
Basophils Relative: 0 %
Eosinophils Absolute: 132 cells/uL (ref 15–500)
Eosinophils Relative: 3 %
HCT: 29.2 % — ABNORMAL LOW (ref 35.0–45.0)
Hemoglobin: 9.2 g/dL — ABNORMAL LOW (ref 11.7–15.5)
Lymphocytes Relative: 38 %
Lymphs Abs: 1672 cells/uL (ref 850–3900)
MCH: 23.8 pg — ABNORMAL LOW (ref 27.0–33.0)
MCHC: 31.5 g/dL — ABNORMAL LOW (ref 32.0–36.0)
MCV: 75.5 fL — ABNORMAL LOW (ref 80.0–100.0)
MPV: 8.5 fL (ref 7.5–12.5)
Monocytes Absolute: 396 cells/uL (ref 200–950)
Monocytes Relative: 9 %
Neutro Abs: 2200 cells/uL (ref 1500–7800)
Neutrophils Relative %: 50 %
Platelets: 331 10*3/uL (ref 140–400)
RBC: 3.87 MIL/uL (ref 3.80–5.10)
RDW: 18.3 % — ABNORMAL HIGH (ref 11.0–15.0)
WBC: 4.4 10*3/uL (ref 3.8–10.8)

## 2016-04-02 LAB — URINALYSIS W MICROSCOPIC + REFLEX CULTURE
Bilirubin Urine: NEGATIVE
Casts: NONE SEEN [LPF]
Crystals: NONE SEEN [HPF]
Glucose, UA: NEGATIVE
Ketones, ur: NEGATIVE
Nitrite: NEGATIVE
Specific Gravity, Urine: 1.025 (ref 1.001–1.035)
WBC, UA: >60 WBC/HPF — AB (ref <=5)
Yeast: NONE SEEN [HPF]
pH: 6 (ref 5.0–8.0)

## 2016-04-02 MED ORDER — TRAMADOL HCL 50 MG PO TABS: 50.0000 mg | ORAL_TABLET | Freq: Four times a day (QID) | ORAL | 0 refills | Status: DC | PRN

## 2016-04-02 NOTE — Telephone Encounter (Signed)
Pt called and left message in voicemail c/o pain. I called pt and received her voicemail, I left message for pt to call.

## 2016-04-02 NOTE — Telephone Encounter (Signed)
Pt called c/o pelvic pain x 3 days describes pain as a pulling sensation then increased sharp pain that lasts about 3 minutes, she is not having any bleeding, states pain comes and goes feels like contractions and the pulling sensation is constant which is very painful as well, pt said her pain level now is a 8 out of 10. I asked her office visit would be needed, patient said she can be here in 10 minutes. Front desk informed to schedule.

## 2016-04-02 NOTE — Progress Notes (Addendum)
    Adylene Rando 20-Nov-1975 [id number]        40 y.o.  [id number] presents with the onset of pelvic pain 3 days ago. Initially started at a low level but progressively worsened and has been an 8 out of 10 for the last 2 days. A lot of pulling sensation on the left. No nausea vomiting. Some diarrhea. No fever or chills. No urinary symptoms such as frequency dysuria urgency. Is able to take fluids and eat. Having regular bowel movements and voiding frequently. History of leiomyoma with significant anemia. Is awaiting approval for Depo-Lupron for menstrual suppression and hemoglobin recovery. Currently on Megace 20 mg daily for menstrual suppression and extra iron. LMP October 2017 with no bleeding since. Status post BTL in the past.  Past medical history,surgical history, problem list, medications, allergies, family history and social history were all reviewed and documented in the EPIC chart.  Directed ROS with pertinent positives and negatives documented in the history of present illness/assessment and plan.  Exam: [name] assistant Vitals:   04/02/16 1118  BP: 124/84   General appearance:  Patient appears mildly uncomfortable. Spine straight without CVA tenderness Abdomen soft with mild tenderness to deep palpation in the pelvis. No rebound guarding or masses. Active bowel sounds throughout. Pelvic external BUS vagina normal. Cervix normal. Uterus difficult to palpate but generous in size consistent with her history of leiomyoma. No acute tenderness to manipulation. Adnexa with tenderness on the left but no masses. Rectal exam is normal  Assessment/Plan:  40 y.o. [id number] with history as above. Urinalysis appears contaminated but shows no red cells to suggest renal lithiasis. Reviewed differential with the patient to include GYN versus non-GYN. Doubt related to her leiomyoma as they are not significantly enlarged on most recent ultrasound in August. Possible ovarian cyst discussed with her. Is  having some diarrhea and may be having some colitis. Will check baseline CBC and hCG. Tramadol 50 mg #20 one by mouth every 6 hours when necessary pain schedule baseline ultrasound of the next 1-2 days to rule out ovarian process.  Addendum: Patient subsequently had ultrasound transvaginal and transabdominal which showed uterus enlarged with 66 mm myoma and 23 mm myoma. Endometrial echo 9.3 mm. Right and left ovaries normal. Cul-de-sac negative.  Anastasio Auerbach MD, 11:38 AM 04/02/2016

## 2016-04-02 NOTE — Patient Instructions (Signed)
Follow up for ultrasound as scheduled 

## 2016-04-02 NOTE — Addendum Note (Signed)
Addended by: Nelva Nay on: 04/02/2016 12:56 PM   Modules accepted: Orders

## 2016-04-03 ENCOUNTER — Telehealth: Payer: Self-pay | Admitting: *Deleted

## 2016-04-03 LAB — HCG, QUANTITATIVE, PREGNANCY: hCG, Beta Chain, Quant, S: <2 m[IU]/mL

## 2016-04-03 LAB — URINE CULTURE

## 2016-04-03 NOTE — Telephone Encounter (Signed)
Pt was seen yesterday Rx for tramadol 50 mg was never called into pharmacy. Rx called in today, pt aware.

## 2016-04-27 ENCOUNTER — Ambulatory Visit (INDEPENDENT_AMBULATORY_CARE_PROVIDER_SITE_OTHER): Payer: 59 | Admitting: Family

## 2016-04-27 ENCOUNTER — Encounter: Payer: Self-pay | Admitting: Family

## 2016-04-27 VITALS — BP 128/82 | HR 76 | Temp 98.1°F | Resp 16 | Ht 67.0 in | Wt 262.0 lb

## 2016-04-27 DIAGNOSIS — B9789 Other viral agents as the cause of diseases classified elsewhere: Secondary | ICD-10-CM | POA: Diagnosis not present

## 2016-04-27 DIAGNOSIS — F329 Major depressive disorder, single episode, unspecified: Secondary | ICD-10-CM

## 2016-04-27 DIAGNOSIS — F418 Other specified anxiety disorders: Secondary | ICD-10-CM | POA: Diagnosis not present

## 2016-04-27 DIAGNOSIS — F32A Depression, unspecified: Secondary | ICD-10-CM

## 2016-04-27 DIAGNOSIS — J988 Other specified respiratory disorders: Secondary | ICD-10-CM

## 2016-04-27 DIAGNOSIS — F419 Anxiety disorder, unspecified: Secondary | ICD-10-CM

## 2016-04-27 MED ORDER — CLONAZEPAM 0.5 MG PO TABS: 0.5000 mg | ORAL_TABLET | Freq: Two times a day (BID) | ORAL | 1 refills | Status: DC | PRN

## 2016-04-27 NOTE — Assessment & Plan Note (Signed)
Anxiety appears adequately controlled with current regimen and no adverse side effects. Continue current dosage of clonazepam. No suicidal ideations. [place] controlled substance database reviewed with no irregularities.

## 2016-04-27 NOTE — Progress Notes (Signed)
Subjective:    Patient ID: Tracy Valenzuela, female    DOB: 08-11-1975, 40 y.o.   MRN: [id number]  Chief Complaint  Patient presents with  . Cough    states that the place around rectum has clear up and is more concerned with sinus issues, refill of klonopin    HPI:  Tracy Valenzuela is a 40 y.o. female who  has a past medical history of ASCUS with positive high risk HPV (11/2014); Asthma; Bipolar 1 disorder ([place]); Heart murmur; LGSIL (low grade squamous intraepithelial dysplasia) (11/2014); and Prediabetes. and presents today for an acute office visit.   1.) Cold symptoms  - This is a new problem. Associated symptom of congestion and cough have been going on for a couple of days. No fevers. Modifying factors include drinking tea but has not tried any medications that are OTC. No recent antibiotics. Course of the symptoms have stayed about the same since initial onset.  2.) Anxiety -  Currently maintained on clonazepam. Reports taking the medication as prescribed and denies adverse side effects. Generally using it once per day but there are occasions where she needs it twice. Symptoms are generally well controlled with the current regimen.    Allergies  Allergen Reactions  . Penicillins Hives    Has patient had a PCN reaction causing immediate rash, facial/tongue/throat swelling, SOB or lightheadedness with hypotension:no Has patient had a PCN reaction causing severe rash involving mucus membranes or skin necrosis: Yes Has patient had a PCN reaction that required hospitalization No Has patient had a PCN reaction occurring within the last 10 years: No If all of the above answers are "NO", then may proceed with Cephalosporin use.       Outpatient Medications Prior to Visit  Medication Sig Dispense Refill  . megestrol (MEGACE) 20 MG tablet Take one daily. If bleeding begins take 1 tab twice daily x 3 days then 1 daily. 60 tablet 1  . QUEtiapine (SEROQUEL) 100 MG tablet Take 0.5-1 tablets  (50-100 mg total) by mouth at bedtime. 90 tablet 1  . clonazePAM (KLONOPIN) 0.5 MG tablet Take 1 tablet (0.5 mg total) by mouth 2 (two) times daily as needed. for anxiety 30 tablet 1  . traMADol (ULTRAM) 50 MG tablet Take 1 tablet (50 mg total) by mouth every 6 (six) hours as needed. 20 tablet 0   No facility-administered medications prior to visit.      Review of Systems  Constitutional: Negative for chills and fever.  HENT: Positive for congestion. Negative for ear pain, sinus pain, sinus pressure and sore throat.   Respiratory: Positive for cough, chest tightness and wheezing. Negative for shortness of breath.       Objective:    BP 128/82 (BP Location: Left Arm, Patient Position: Sitting, Cuff Size: Large)   Pulse 76   Temp 98.1 F (36.7 C) (Oral)   Resp 16   Ht 5\' 7"  (1.702 m)   Wt 262 lb (118.8 kg)   LMP 01/29/2016   SpO2 98%   BMI 41.04 kg/m  Nursing note and vital signs reviewed.  Physical Exam  Constitutional: She is oriented to person, place, and time. She appears well-developed and well-nourished. No distress.  HENT:  Right Ear: Hearing, tympanic membrane, external ear and ear canal normal.  Left Ear: Hearing, tympanic membrane, external ear and ear canal normal.  Nose: Nose normal. Right sinus exhibits no maxillary sinus tenderness and no frontal sinus tenderness. Left sinus exhibits no maxillary sinus tenderness and no  frontal sinus tenderness.  Mouth/Throat: Uvula is midline, oropharynx is clear and moist and mucous membranes are normal.  Cardiovascular: Normal rate, regular rhythm, normal heart sounds and intact distal pulses.   Pulmonary/Chest: Effort normal and breath sounds normal.  Neurological: She is alert and oriented to person, place, and time.  Skin: Skin is warm and dry.  Psychiatric: She has a normal mood and affect. Her behavior is normal. Judgment and thought content normal.       Assessment & Plan:   Problem List Items Addressed This Visit        Respiratory   Viral respiratory infection - Primary    Symptoms and exam consistent with viral respiratory infection. Continue over-the-counter medications as needed for symptom relief and supportive care. Follow-up if symptoms worsen or do not improve.        Other   Anxiety and depression    Anxiety appears adequately controlled with current regimen and no adverse side effects. Continue current dosage of clonazepam. No suicidal ideations. [place] controlled substance database reviewed with no irregularities.      Relevant Medications   clonazePAM (KLONOPIN) 0.5 MG tablet       I have discontinued Ms. Montesano's traMADol. I am also having her maintain her QUEtiapine, megestrol, and clonazePAM.   Meds ordered this encounter  Medications  . clonazePAM (KLONOPIN) 0.5 MG tablet    Sig: Take 1 tablet (0.5 mg total) by mouth 2 (two) times daily as needed. for anxiety    Dispense:  30 tablet    Refill:  1    Order Specific Question:   Supervising Provider    Answer:   Pricilla Holm A [id number]     Follow-up: Return in about 3 months (around 07/26/2016), or if symptoms worsen or fail to improve.  Mauricio Po, FNP

## 2016-04-27 NOTE — Patient Instructions (Addendum)
Thank you for choosing [facility].  SUMMARY AND INSTRUCTIONS:  Medication:  Your prescription(s) have been submitted to your pharmacy or been printed and provided for you. Please take as directed and contact our office if you believe you are having problem(s) with the medication(s) or have any questions.  Follow up:  If your symptoms worsen or fail to improve, please contact our office for further instruction, or in case of emergency go directly to the emergency room at the closest medical facility.    General Recommendations:    Please drink plenty of fluids.  Get plenty of rest   Sleep in humidified air  Use saline nasal sprays  Netti pot   OTC Medications:  Decongestants - helps relieve congestion   Flonase (generic fluticasone) or Nasacort (generic triamcinolone) - please make sure to use the "cross-over" technique at a 45 degree angle towards the opposite eye as opposed to straight up the nasal passageway.   Sudafed (generic pseudoephedrine - Note this is the one that is available behind the pharmacy counter); Products with phenylephrine (-PE) may also be used but is often not as effective as pseudoephedrine.   If you have HIGH BLOOD PRESSURE - Coricidin HBP; AVOID any product that is -D as this contains pseudoephedrine which may increase your blood pressure.  Afrin (oxymetazoline) every 6-8 hours for up to 3 days.   Allergies - helps relieve runny nose, itchy eyes and sneezing   Claritin (generic loratidine), Allegra (fexofenidine), or Zyrtec (generic cyrterizine) for runny nose. These medications should not cause drowsiness.  Note - Benadryl (generic diphenhydramine) may be used however may cause drowsiness  Cough -   Delsym or Robitussin (generic dextromethorphan)  Expectorants - helps loosen mucus to ease removal   Mucinex (generic guaifenesin) as directed on the package.  Headaches / General Aches   Tylenol (generic acetaminophen) - DO NOT  EXCEED 3 grams (3,000 mg) in a 24 hour time period  Advil/Motrin (generic ibuprofen)   Sore Throat -   Salt water gargle   Chloraseptic (generic benzocaine) spray or lozenges / Sucrets (generic dyclonine)     

## 2016-04-27 NOTE — Assessment & Plan Note (Signed)
Symptoms and exam consistent with viral respiratory infection. Continue over-the-counter medications as needed for symptom relief and supportive care. Follow-up if symptoms worsen or do not improve.

## 2016-05-08 ENCOUNTER — Telehealth: Payer: Self-pay

## 2016-05-08 NOTE — Telephone Encounter (Signed)
I called and left message on recorder per DPR access note on file. I left message that her medication (Lupron) has arrived at the office and she just needs to call me to schedule a nurse appt to come and have the injection.

## 2016-05-09 ENCOUNTER — Telehealth: Payer: Self-pay

## 2016-05-09 NOTE — Telephone Encounter (Signed)
It has taken awhile but we finally have patient's Lupron and she is scheduled to have injection on Friday 05/11/16.  She asked about scheduling her surgery 3 mos out from that. Can you send me a surgery order. I explained I don't have schedules that far out yet but can call her as soon as I get end of March/April schedule. Meantime I will check ins benefits for her.

## 2016-05-09 NOTE — Telephone Encounter (Signed)
Patient called and scheduled nurse appt to have inj on Friday afternoon.

## 2016-05-10 NOTE — Telephone Encounter (Signed)
I sent surgical slip. Patient will need CBC 2 months after Lupron shot to see how she's doing as far as her blood count

## 2016-05-10 NOTE — Telephone Encounter (Signed)
Patient will be advised.

## 2016-05-11 ENCOUNTER — Ambulatory Visit: Payer: Self-pay

## 2016-05-18 ENCOUNTER — Ambulatory Visit (INDEPENDENT_AMBULATORY_CARE_PROVIDER_SITE_OTHER): Payer: 59 | Admitting: Gynecology

## 2016-05-18 DIAGNOSIS — D251 Intramural leiomyoma of uterus: Secondary | ICD-10-CM

## 2016-05-18 DIAGNOSIS — D219 Benign neoplasm of connective and other soft tissue, unspecified: Secondary | ICD-10-CM

## 2016-05-18 MED ORDER — LEUPROLIDE ACETATE (3 MONTH) 11.25 MG IM KIT
11.2500 mg | PACK | Freq: Once | INTRAMUSCULAR | Status: AC
  Administered 2016-05-18: 11.25 mg via INTRAMUSCULAR

## 2016-05-23 ENCOUNTER — Emergency Department (HOSPITAL_COMMUNITY): Payer: 59

## 2016-05-23 ENCOUNTER — Encounter (HOSPITAL_COMMUNITY): Payer: Self-pay | Admitting: Emergency Medicine

## 2016-05-23 ENCOUNTER — Emergency Department (HOSPITAL_COMMUNITY)
Admission: EM | Admit: 2016-05-23 | Discharge: 2016-05-23 | Disposition: A | Discharge status: Home / Self Care | Payer: 59 | Attending: Emergency Medicine | Admitting: Emergency Medicine

## 2016-05-23 DIAGNOSIS — J45909 Unspecified asthma, uncomplicated: Secondary | ICD-10-CM | POA: Insufficient documentation

## 2016-05-23 DIAGNOSIS — R0989 Other specified symptoms and signs involving the circulatory and respiratory systems: Secondary | ICD-10-CM | POA: Diagnosis not present

## 2016-05-23 DIAGNOSIS — R1310 Dysphagia, unspecified: Secondary | ICD-10-CM | POA: Diagnosis not present

## 2016-05-23 DIAGNOSIS — J392 Other diseases of pharynx: Secondary | ICD-10-CM | POA: Insufficient documentation

## 2016-05-23 DIAGNOSIS — J020 Streptococcal pharyngitis: Secondary | ICD-10-CM | POA: Insufficient documentation

## 2016-05-23 DIAGNOSIS — Z87891 Personal history of nicotine dependence: Secondary | ICD-10-CM | POA: Diagnosis not present

## 2016-05-23 DIAGNOSIS — J029 Acute pharyngitis, unspecified: Secondary | ICD-10-CM | POA: Diagnosis present

## 2016-05-23 LAB — I-STAT CHEM 8, ED
BUN: 12 mg/dL (ref 6–20)
Calcium, Ion: 1.3 mmol/L (ref 1.15–1.40)
Chloride: 109 mmol/L (ref 101–111)
Creatinine, Ser: 0.8 mg/dL (ref 0.44–1.00)
Glucose, Bld: 164 mg/dL — ABNORMAL HIGH (ref 65–99)
HCT: 31 % — ABNORMAL LOW (ref 36.0–46.0)
Hemoglobin: 10.5 g/dL — ABNORMAL LOW (ref 12.0–15.0)
Potassium: 3.5 mmol/L (ref 3.5–5.1)
Sodium: 142 mmol/L (ref 135–145)
TCO2: 21 mmol/L (ref 0–100)

## 2016-05-23 LAB — I-STAT BETA HCG BLOOD, ED (MC, WL, AP ONLY): I-stat hCG, quantitative: <5 m[IU]/mL (ref <5)

## 2016-05-23 LAB — RAPID STREP SCREEN (MED CTR MEBANE ONLY): Streptococcus, Group A Screen (Direct): POSITIVE — AB

## 2016-05-23 MED ORDER — IOPAMIDOL (ISOVUE-300) INJECTION 61%
INTRAVENOUS | Status: AC
  Filled 2016-05-23: qty 75

## 2016-05-23 MED ORDER — CLINDAMYCIN HCL 150 MG PO CAPS: 450.0000 mg | ORAL_CAPSULE | Freq: Three times a day (TID) | ORAL | 0 refills | Status: DC

## 2016-05-23 MED ORDER — CLINDAMYCIN HCL 150 MG PO CAPS
450.0000 mg | ORAL_CAPSULE | Freq: Once | ORAL | Status: AC
  Administered 2016-05-23: 450 mg via ORAL
  Filled 2016-05-23: qty 3

## 2016-05-23 MED ORDER — SODIUM CHLORIDE 0.9 % IV BOLUS (SEPSIS)
1000.0000 mL | Freq: Once | INTRAVENOUS | Status: AC
  Administered 2016-05-23: 1000 mL via INTRAVENOUS

## 2016-05-23 MED ORDER — PREDNISONE 50 MG PO TABS: ORAL_TABLET | ORAL | 0 refills | Status: DC

## 2016-05-23 MED ORDER — IOPAMIDOL (ISOVUE-300) INJECTION 61%
75.0000 mL | Freq: Once | INTRAVENOUS | Status: AC | PRN
  Administered 2016-05-23: 75 mL via INTRAVENOUS

## 2016-05-23 MED ORDER — DEXAMETHASONE SODIUM PHOSPHATE 10 MG/ML IJ SOLN
10.0000 mg | Freq: Once | INTRAMUSCULAR | Status: AC
  Administered 2016-05-23: 10 mg via INTRAVENOUS
  Filled 2016-05-23: qty 1

## 2016-05-23 NOTE — Discharge Instructions (Signed)
Your antibiotics as directed and drink plenty of fluid.  Please follow with your primary care doctor in the next 2 days for a check-up. They must obtain records for further management.   Do not hesitate to return to the Emergency Department for any new, worsening or concerning symptoms.

## 2016-05-23 NOTE — ED Triage Notes (Signed)
Patient complains of having something stuck in her throat. Patient states that she felt a ball in her throat and it woke her out of her sleep. The patient is not itching.

## 2016-05-23 NOTE — ED Provider Notes (Signed)
[facility] DEPT Provider Note   CSN: [id number] Arrival date & time: 05/23/16  [id number]     History   Chief Complaint Chief Complaint  Patient presents with  . Sore Throat  . Oral Swelling     HPI   Blood pressure 123/73, pulse 78, temperature 98.3 F (36.8 C), temperature source Oral, resp. rate 20, height 5\' 6"  (1.676 m), weight 118.4 kg, SpO2 98 %.  Tracy Valenzuela is a 41 y.o. female complaining of being woken from sleep last night with a sensation of having dryness. She states that she has a sensation that there is a ball of phlegm in the back of her throat and that it is difficult for her to swallow, states that she has been trying to cough it up but it doesn't help. She has been trying to drink to push the seventh station down but she states that she vomits whenever she drinks. Her voice is also changed. She went to bed and she was feeling perfectly normal last night. She denies rhinorrhea, cough, fever.  Past Medical History:  Diagnosis Date  . ASCUS with positive high risk HPV 11/2014  . Asthma   . Bipolar 1 disorder ([place])   . Heart murmur   . LGSIL (low grade squamous intraepithelial dysplasia) 11/2014   colposcopy biopsy.  recommend follow up pap in one year  . Prediabetes     Patient Active Problem List   Diagnosis Date Noted  . Viral respiratory infection 04/27/2016  . Vaginal bleeding 11/24/2015  . Morbid obesity ([place]) 11/09/2015  . Bipolar 1 disorder, mixed, moderate ([place]) 09/30/2015  . Decreased attention Span 09/30/2015  . Menometrorrhagia 10/19/2014  . Iron deficiency anemia 10/19/2014  . Anxiety and depression 10/19/2014  . Acute sinusitis 10/19/2014    Past Surgical History:  Procedure Laterality Date  . CARDIAC CATHETERIZATION    . TUBAL LIGATION      OB History    Gravida Para Term Preterm AB Living   3 1   1 1 3    SAB TAB Ectopic Multiple Live Births   1   0          Obstetric Comments   Had a set of twins that were preterm (30 weeks)         Home Medications    Prior to Admission medications   Medication Sig Start Date End Date Taking? Authorizing Provider  clonazePAM (KLONOPIN) 0.5 MG tablet Take 1 tablet (0.5 mg total) by mouth 2 (two) times daily as needed. for anxiety 04/27/16  Yes Golden Circle, FNP  guaiFENesin (MUCINEX) 600 MG 12 hr tablet Take 600 mg by mouth once.   Yes Historical Provider, MD  QUEtiapine (SEROQUEL) 100 MG tablet Take 0.5-1 tablets (50-100 mg total) by mouth at bedtime. 11/09/15  Yes Golden Circle, FNP  clindamycin (CLEOCIN) 150 MG capsule Take 3 capsules (450 mg total) by mouth 3 (three) times daily. 05/23/16    , PA-C  megestrol (MEGACE) 20 MG tablet Take one daily. If bleeding begins take 1 tab twice daily x 3 days then 1 daily. Patient not taking: Reported on 05/23/2016 01/12/16   Anastasio Auerbach, MD  predniSONE (DELTASONE) 50 MG tablet Take 1 tablet daily with breakfast 05/23/16   Monico Blitz, PA-C    Family History Family History  Problem Relation Age of Onset  . Lupus Mother   . Heart disease Mother   . Diabetes Maternal Grandmother   . Heart disease Maternal Grandmother   .  Hypertension Maternal Grandmother   . Prostate cancer Maternal Grandfather   . Alzheimer's disease Paternal Grandfather     Social History Social History  Substance Use Topics  . Smoking status: Former Smoker    Types: Cigarettes    Quit date: 04/30/2014  . Smokeless tobacco: Never Used  . Alcohol use 0.0 oz/week     Comment: occasionally     Allergies   Penicillins   Review of Systems Review of Systems  10 systems reviewed and found to be negative, except as noted in the HPI.   Physical Exam Updated Vital Signs BP 107/63   Pulse 72   Temp 98.3 F (36.8 C) (Oral)   Resp 16   Ht 5\' 6"  (1.676 m)   Wt 118.4 kg   LMP  (LMP Unknown) Comment: neg beta hcg 05-23-16  SpO2 100%   BMI 42.13 kg/m   Physical Exam  Constitutional: She is oriented to person, place, and  time. She appears well-developed and well-nourished. No distress.  HENT:  Head: Normocephalic and atraumatic.  Mouth/Throat: Oropharynx is clear and moist.  Handling her secretions, nasal sounding voice. Posterior pharynx with erythematous and inflamed uvula, which is midline. Bilateral tonsillar hypertrophy 2+, no exudate. Soft palate rises symmetrically.  Eyes: Conjunctivae and EOM are normal. Pupils are equal, round, and reactive to light.  Neck: Normal range of motion.  Cardiovascular: Normal rate, regular rhythm and intact distal pulses.   Pulmonary/Chest: Effort normal and breath sounds normal.  Abdominal: Soft. There is no tenderness.  Musculoskeletal: Normal range of motion.  Neurological: She is alert and oriented to person, place, and time.  Skin: She is not diaphoretic.  Psychiatric: She has a normal mood and affect.  Nursing note and vitals reviewed.    ED Treatments / Results  Labs (all labs ordered are listed, but only abnormal results are displayed) Labs Reviewed  RAPID STREP SCREEN (NOT AT [facility]) - Abnormal; Notable for the following:       Result Value   Streptococcus, Group A Screen (Direct) POSITIVE (*)    All other components within normal limits  I-STAT CHEM 8, ED - Abnormal; Notable for the following:    Glucose, Bld 164 (*)    Hemoglobin 10.5 (*)    HCT 31.0 (*)    All other components within normal limits  I-STAT BETA HCG BLOOD, ED ([facility], [facility], [facility] ONLY)    EKG  EKG Interpretation None       Radiology Dg Neck Soft Tissue  Result Date: 05/23/2016 CLINICAL DATA:  41 year old female with odynophagia. EXAM: NECK SOFT TISSUES - 1+ VIEW COMPARISON:  None. FINDINGS: There is no evidence of retropharyngeal soft tissue swelling or epiglottic enlargement. The cervical airway is unremarkable and no radio-opaque foreign body identified. IMPRESSION: Negative. Electronically Signed   By: Anner Crete M.D.   On: 05/23/2016 06:47   Ct Soft Tissue Neck W  Contrast  Result Date: 05/23/2016 CLINICAL DATA:  Globus sensation.  Inflamed uvula EXAM: CT NECK WITH CONTRAST TECHNIQUE: Multidetector CT imaging of the neck was performed using the standard protocol following the bolus administration of intravenous contrast. CONTRAST:  9mL ISOVUE-300 IOPAMIDOL (ISOVUE-300) INJECTION 61% COMPARISON:  Soft tissue neck x-rays 05/23/2016 FINDINGS: Pharynx and larynx: 1 cm or low-density area in the posterior nasopharynx and midline with mild surrounding enhancement. This is most compatible with a Tornwaldt cyst. This could be infected given the mild enhancement. Bilateral tonsillar hypertrophy. These meet in the midline posterior to the view uvula. No  tonsillar abscess. Uvula mildly edematous. Epiglottis and vocal cords normal. Salivary glands: Submandibular glands normal bilaterally. 6 mm enhancing nodule right superior anterior parotid gland likely a lymph node. Similar 7 mm lesion in the left parotid gland also likely lymph node. Thyroid: Negative Lymph nodes: No pathologic adenopathy. Reactive lymph nodes in the neck include 8 mm and 9 mm submandibular nodes on the right. Right level 2 node 10 mm. Left submandibular nodes 10 mm, 12 mm. Left level 2 node 9 mm. Scattered small posterior lymph nodes bilaterally. Probable bilateral small parotid lymph nodes. Vascular: Jugular vein and carotid artery patent bilaterally. Limited intracranial: Negative Visualized orbits: Negative Mastoids and visualized paranasal sinuses: Mild mucosal edema in the maxillary sinus bilaterally. Remaining sinuses clear. Skeleton: Negative. Mild periapical lucency around lower third molar bilaterally. 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 Possibly inflamed. Bilateral tonsillar hypertrophy without abscess or mass. Mild reactive adenopathy in the neck. Electronically Signed   By: Franchot Gallo M.D.    On: 05/23/2016 08:24    Procedures Procedures (including critical care time)  Medications Ordered in ED Medications  iopamidol (ISOVUE-300) 61 % injection (not administered)  sodium chloride 0.9 % bolus 1,000 mL (1,000 mLs Intravenous New Bag/Given 05/23/16 [id number])  dexamethasone (DECADRON) injection 10 mg (10 mg Intravenous Given 05/23/16 0652)  iopamidol (ISOVUE-300) 61 % injection 75 mL (75 mLs Intravenous Contrast Given 05/23/16 0748)  clindamycin (CLEOCIN) capsule 450 mg (450 mg Oral Given 05/23/16 0905)     Initial Impression / Assessment and Plan / ED Course  I have reviewed the triage vital signs and the nursing notes.  Pertinent labs & imaging results that were available during my care of the patient were reviewed by me and considered in my medical decision making (see chart for details).     Vitals:   05/23/16 0600 05/23/16 0700 05/23/16 0730 05/23/16 0943  BP: 130/77 115/86 123/78 107/63  Pulse: 73 72 61 72  Resp:   16 16  Temp:      TempSrc:      SpO2: 100% 100% 99% 100%  Weight:      Height:        Medications  iopamidol (ISOVUE-300) 61 % injection (not administered)  sodium chloride 0.9 % bolus 1,000 mL (1,000 mLs Intravenous New Bag/Given 05/23/16 0652)  dexamethasone (DECADRON) injection 10 mg (10 mg Intravenous Given 05/23/16 0652)  iopamidol (ISOVUE-300) 61 % injection 75 mL (75 mLs Intravenous Contrast Given 05/23/16 0748)  clindamycin (CLEOCIN) capsule 450 mg (450 mg Oral Given 05/23/16 0905)    Tracy Valenzuela is 41 y.o. female presenting with Globus sensation and difficulty swallowing, she swallowing her secretions, no associated shortness of breath. Uvula is inflamed. Patient given fluids, Decadron, CT soft tissue neck pending.  Rapid strep is positive. CT soft tissue neck with posterior midline cyst consistent with Thornwaldt cyst. Likely the combination of the strep with the cyst is causing her symptoms. She feels much better after Decadron and fluids. Will  be given referral to ENT. Patient passed by mouth challenged without complication.  Evaluation does not show pathology that would require ongoing emergent intervention or inpatient treatment. Pt is hemodynamically stable and mentating appropriately. Discussed findings and plan with patient/guardian, who agrees with care plan. All questions answered. Return precautions discussed and outpatient follow up given.    Final Clinical Impressions(s) / ED Diagnoses   Final diagnoses:  Odynophagia  Strep  pharyngitis  Thornwaldt's cyst    New Prescriptions New Prescriptions   CLINDAMYCIN (CLEOCIN) 150 MG CAPSULE    Take 3 capsules (450 mg total) by mouth 3 (three) times daily.   PREDNISONE (DELTASONE) 50 MG TABLET    Take 1 tablet daily with breakfast     Monico Blitz, PA-C 05/23/16 [place], MD 05/24/16 [phone]

## 2016-05-29 ENCOUNTER — Ambulatory Visit (INDEPENDENT_AMBULATORY_CARE_PROVIDER_SITE_OTHER): Payer: 59 | Admitting: Family

## 2016-05-29 ENCOUNTER — Encounter: Payer: Self-pay | Admitting: Family

## 2016-05-29 VITALS — BP 116/78 | HR 73 | Temp 97.6°F | Resp 18 | Ht 66.0 in | Wt 267.0 lb

## 2016-05-29 DIAGNOSIS — J392 Other diseases of pharynx: Secondary | ICD-10-CM | POA: Insufficient documentation

## 2016-05-29 DIAGNOSIS — J020 Streptococcal pharyngitis: Secondary | ICD-10-CM

## 2016-05-29 NOTE — Patient Instructions (Signed)
Thank you for choosing [facility].  SUMMARY AND INSTRUCTIONS:  Please continue to take the clindamycin as prescribed.  Over the counter meds as below for symptom relief.   Follow up:  If your symptoms worsen or fail to improve, please contact our office for further instruction, or in case of emergency go directly to the emergency room at the closest medical facility.    General Recommendations:    Please drink plenty of fluids.  Get plenty of rest   Sleep in humidified air  Use saline nasal sprays  Netti pot   OTC Medications:  Decongestants - helps relieve congestion   Flonase (generic fluticasone) or Nasacort (generic triamcinolone) - please make sure to use the "cross-over" technique at a 45 degree angle towards the opposite eye as opposed to straight up the nasal passageway.   Sudafed (generic pseudoephedrine - Note this is the one that is available behind the pharmacy counter); Products with phenylephrine (-PE) may also be used but is often not as effective as pseudoephedrine.   If you have HIGH BLOOD PRESSURE - Coricidin HBP; AVOID any product that is -D as this contains pseudoephedrine which may increase your blood pressure.  Afrin (oxymetazoline) every 6-8 hours for up to 3 days.   Allergies - helps relieve runny nose, itchy eyes and sneezing   Claritin (generic loratidine), Allegra (fexofenidine), or Zyrtec (generic cyrterizine) for runny nose. These medications should not cause drowsiness.  Note - Benadryl (generic diphenhydramine) may be used however may cause drowsiness  Cough -   Delsym or Robitussin (generic dextromethorphan)  Expectorants - helps loosen mucus to ease removal   Mucinex (generic guaifenesin) as directed on the package.  Headaches / General Aches   Tylenol (generic acetaminophen) - DO NOT EXCEED 3 grams (3,000 mg) in a 24 hour time period  Advil/Motrin (generic ibuprofen)   Sore Throat -   Salt water gargle    Chloraseptic (generic benzocaine) spray or lozenges / Sucrets (generic dyclonine)

## 2016-05-29 NOTE — Assessment & Plan Note (Signed)
Strep infection appears improved with clindamycin. Does have some continued difficulty swallowing with no airway obstruction. Continue 14 days worth of medication and follow up with ENT as scheduled.

## 2016-05-29 NOTE — Assessment & Plan Note (Signed)
Noted on CT scan with referral to ENT already placed with up-coming appointment. Most likely incidental finding. Continue to monitor and follow up with ENT for additional treatment as indicated.

## 2016-05-29 NOTE — Progress Notes (Signed)
Subjective:    Patient ID: Tracy Valenzuela, female    DOB: 01-28-1976, 41 y.o.   MRN: [id number]  Chief Complaint  Patient presents with  . Hospitalization Follow-up    still has that strange feeling in her throat like there is a knot, watching what she eats    HPI:  Tracy Valenzuela is a 41 y.o. female who  has a past medical history of ASCUS with positive high risk HPV (11/2014); Asthma; Bipolar 1 disorder ([place]); Heart murmur; LGSIL (low grade squamous intraepithelial dysplasia) (11/2014); and Prediabetes. and presents today for an office visit.   Recently evaluated in the emergency department with the chief complaint of dry throat and feeling like there is a ball of phlegm with difficulty swallowing. Modifying factors include fluids, however she vomits whenever she drinks. Physical exam with erythema medicine and inflamed uvula and posterior pharynx. Tonsillar hypertrophy of 2+ with no exudate. No apparent airway obstruction. Rapid strep test positive. Soft tissue x-ray of the neck was negative. 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 She was prescribed clindamycin and given a referral for ear nose and throat.   Since leaving the ED she reports that she continues to have some difficulty swallowing especially rice and pasta. Reports taking clindamycin as prescribed and denies adverse side effects. Notes that has some improvements with the antibiotic but continues to describe a globus sensation. Has a follow up appointment with ENT in about 1 week. Does have some shortness of breath. Denies any fevers, rashes or other cold/flu like symptoms.    Allergies  Allergen Reactions  . Penicillins Hives    Has patient had a PCN reaction causing immediate rash, facial/tongue/throat swelling, SOB or lightheadedness with hypotension:no Has patient had a PCN reaction causing severe rash involving mucus membranes or skin  necrosis: Yes Has patient had a PCN reaction that required hospitalization No Has patient had a PCN reaction occurring within the last 10 years: No If all of the above answers are "NO", then may proceed with Cephalosporin use.       Outpatient Medications Prior to Visit  Medication Sig Dispense Refill  . clindamycin (CLEOCIN) 150 MG capsule Take 3 capsules (450 mg total) by mouth 3 (three) times daily. 90 capsule 0  . clonazePAM (KLONOPIN) 0.5 MG tablet Take 1 tablet (0.5 mg total) by mouth 2 (two) times daily as needed. for anxiety 30 tablet 1  . guaiFENesin (MUCINEX) 600 MG 12 hr tablet Take 600 mg by mouth once.    Marland Kitchen QUEtiapine (SEROQUEL) 100 MG tablet Take 0.5-1 tablets (50-100 mg total) by mouth at bedtime. 90 tablet 1  . megestrol (MEGACE) 20 MG tablet Take one daily. If bleeding begins take 1 tab twice daily x 3 days then 1 daily. (Patient not taking: Reported on 05/23/2016) 60 tablet 1  . predniSONE (DELTASONE) 50 MG tablet Take 1 tablet daily with breakfast 5 tablet 0   No facility-administered medications prior to visit.       Past Surgical History:  Procedure Laterality Date  . CARDIAC CATHETERIZATION    . TUBAL LIGATION        Past Medical History:  Diagnosis Date  . ASCUS with positive high risk HPV 11/2014  . Asthma   . Bipolar 1 disorder ([place])   . Heart murmur   . LGSIL (low grade squamous intraepithelial dysplasia) 11/2014   colposcopy biopsy.  recommend follow up pap in  one year  . Prediabetes       Review of Systems  Constitutional: Negative for chills and fever.  HENT: Positive for sore throat. Negative for congestion.   Respiratory: Positive for shortness of breath. Negative for cough, chest tightness and wheezing.   Cardiovascular: Negative for chest pain, palpitations and leg swelling.      Objective:    BP 116/78 (BP Location: Left Arm, Patient Position: Sitting, Cuff Size: Large)   Pulse 73   Temp 97.6 F (36.4 C) (Oral)   Resp 18   Ht 5'  6" (1.676 m)   Wt 267 lb (121.1 kg)   LMP  (LMP Unknown) Comment: neg beta hcg 05-23-16  SpO2 94%   BMI 43.09 kg/m  Nursing note and vital signs reviewed.  Physical Exam  Constitutional: She is oriented to person, place, and time. She appears well-developed and well-nourished. No distress.  HENT:  Right Ear: Hearing, tympanic membrane, external ear and ear canal normal.  Left Ear: Hearing, tympanic membrane, external ear and ear canal normal.  Nose: Nose normal.  Mouth/Throat: Uvula is midline. No posterior oropharyngeal erythema or tonsillar abscesses.  Tonsils are 2+ with no exudate.   Cardiovascular: Normal rate, regular rhythm, normal heart sounds and intact distal pulses.   Pulmonary/Chest: Effort normal and breath sounds normal.  Neurological: She is alert and oriented to person, place, and time.  Skin: Skin is warm and dry.  Psychiatric: She has a normal mood and affect. Her behavior is normal. Judgment and thought content normal.       Assessment & Plan:   Problem List Items Addressed This Visit      Respiratory   Strep pharyngitis - Primary    Strep infection appears improved with clindamycin. Does have some continued difficulty swallowing with no airway obstruction. Continue 14 days worth of medication and follow up with ENT as scheduled.       Thornwaldt's cyst    Noted on CT scan with referral to ENT already placed with up-coming appointment. Most likely incidental finding. Continue to monitor and follow up with ENT for additional treatment as indicated.           I have discontinued Ms. Everton's megestrol and predniSONE. I am also having her maintain her QUEtiapine, clonazePAM, guaiFENesin, and clindamycin.   Follow-up: Return if symptoms worsen or fail to improve.  Mauricio Po, FNP

## 2016-06-07 ENCOUNTER — Telehealth: Payer: Self-pay | Admitting: *Deleted

## 2016-06-07 MED ORDER — MEGESTROL ACETATE 20 MG PO TABS: 20.0000 mg | ORAL_TABLET | Freq: Two times a day (BID) | ORAL | 0 refills | Status: DC

## 2016-06-07 NOTE — Telephone Encounter (Signed)
Pt informed, Rx sent. 

## 2016-06-07 NOTE — Telephone Encounter (Signed)
Pt receive Lupron on 05/18/16, states she has been bleeding now x10 days, was heavy flow, but now light flow. Pt was aware that bleeding may occur, but 10 days is not what was expected. Please advise

## 2016-06-07 NOTE — Telephone Encounter (Signed)
Not unusual to have a flare effect our patients will have higher estrogen levels and then have them suppressed after Depo-Lupron which then leads to a withdrawal bleed. We could add Megace 20 mg twice a day for 5 days to see if it doesn't help resolve the bleeding

## 2016-06-13 ENCOUNTER — Telehealth: Payer: Self-pay

## 2016-06-13 DIAGNOSIS — D500 Iron deficiency anemia secondary to blood loss (chronic): Secondary | ICD-10-CM

## 2016-06-13 NOTE — Telephone Encounter (Signed)
Left message for patient inquiring about dates for surgery 3/20 and 4/3. Asked her to call me to discuss.

## 2016-06-14 NOTE — Telephone Encounter (Signed)
Patient called back today in voice mail and said she would like to have surgery on 07/31/16.  I scheduled her and called her back and asked her to call me for instructions.

## 2016-06-14 NOTE — Addendum Note (Signed)
Addended by: Ramond Craver on: 06/14/2016 02:45 PM   Modules accepted: Orders

## 2016-06-14 NOTE — Telephone Encounter (Signed)
I called patient and confirmed I have surgery scheduled for 07/31/16 at 7:30am at [facility].  She knows to expect [facility] to contact her with instructions/appt.    I reviewed her ins benefits and her estimated surgery prepaymt due. Letter will be sent.  We reviewed that Dr. Loetta Rough wanted her to return 2 mos from Lupron injection to check CBC.  I scheduled her a lab appt for next month. Lupron was 05/11/16. Order placed.  She knows our appt desk will contact her to schedule pre op appt with Dr. Loetta Rough.

## 2016-06-21 ENCOUNTER — Encounter: Payer: Self-pay | Admitting: Gynecology

## 2016-07-06 ENCOUNTER — Telehealth: Payer: Self-pay

## 2016-07-06 NOTE — Telephone Encounter (Signed)
Patient called stating she is having some problems with her throat and is going to have endoscopy and has concerns that other procedure may be needed. She is calling to reschedule her surgery until end of May.  I moved it to May 29 7:30am.  Her pre op app with Dr. Loetta Rough moved to 09/14/16.  Patient had Lupron on 05/11/16 and has continue to have bleeding off and on.  She is scheduled 07/11/16 for CBC and I told her to keep that appointment so Dr. Loetta Rough can see how her CBC has responded.

## 2016-07-06 NOTE — Telephone Encounter (Signed)
If she is postponing surgery then I would also recommend another shot of Lupron to suppress her through the surgery.

## 2016-07-09 NOTE — Telephone Encounter (Signed)
Do you want her to have the 11.25 Lupron dose again?

## 2016-07-11 ENCOUNTER — Other Ambulatory Visit: Payer: Self-pay

## 2016-07-11 NOTE — Telephone Encounter (Signed)
Per Dr Loetta Rough okay for Lupron 3.75mg .  Order form sent to [facility].

## 2016-07-14 ENCOUNTER — Ambulatory Visit: Payer: 59 | Admitting: Internal Medicine

## 2016-07-23 ENCOUNTER — Ambulatory Visit: Payer: 59 | Admitting: Gynecology

## 2016-08-13 ENCOUNTER — Telehealth: Payer: Self-pay | Admitting: Family

## 2016-08-13 DIAGNOSIS — K921 Melena: Secondary | ICD-10-CM

## 2016-08-13 NOTE — Telephone Encounter (Signed)
Referral has been made. Pt is aware.

## 2016-08-13 NOTE — Telephone Encounter (Signed)
Pt called in said that she is now having bleed, black and mucus in her stool.  She want referral to GI ASAP

## 2016-08-16 ENCOUNTER — Ambulatory Visit: Payer: Self-pay

## 2016-08-16 ENCOUNTER — Other Ambulatory Visit: Payer: 59

## 2016-08-17 ENCOUNTER — Ambulatory Visit: Payer: 59

## 2016-08-17 ENCOUNTER — Other Ambulatory Visit: Payer: 59

## 2016-08-21 ENCOUNTER — Ambulatory Visit (INDEPENDENT_AMBULATORY_CARE_PROVIDER_SITE_OTHER): Payer: 59 | Admitting: Internal Medicine

## 2016-08-21 ENCOUNTER — Other Ambulatory Visit (INDEPENDENT_AMBULATORY_CARE_PROVIDER_SITE_OTHER): Payer: 59

## 2016-08-21 ENCOUNTER — Telehealth: Payer: Self-pay | Admitting: Family

## 2016-08-21 ENCOUNTER — Encounter: Payer: Self-pay | Admitting: Internal Medicine

## 2016-08-21 VITALS — BP 120/74 | HR 72 | Temp 97.5°F | Ht 66.0 in | Wt 266.8 lb

## 2016-08-21 DIAGNOSIS — R7303 Prediabetes: Secondary | ICD-10-CM

## 2016-08-21 DIAGNOSIS — R202 Paresthesia of skin: Secondary | ICD-10-CM

## 2016-08-21 DIAGNOSIS — R197 Diarrhea, unspecified: Secondary | ICD-10-CM | POA: Diagnosis not present

## 2016-08-21 DIAGNOSIS — R1084 Generalized abdominal pain: Secondary | ICD-10-CM | POA: Diagnosis not present

## 2016-08-21 DIAGNOSIS — K219 Gastro-esophageal reflux disease without esophagitis: Secondary | ICD-10-CM | POA: Diagnosis not present

## 2016-08-21 LAB — VITAMIN B12: Vitamin B-12: 394 pg/mL (ref 211–911)

## 2016-08-21 LAB — COMPREHENSIVE METABOLIC PANEL
ALT: 14 U/L (ref 0–35)
AST: 14 U/L (ref 0–37)
Albumin: 4 g/dL (ref 3.5–5.2)
Alkaline Phosphatase: 67 U/L (ref 39–117)
BUN: 11 mg/dL (ref 6–23)
CO2: 27 mEq/L (ref 19–32)
Calcium: 9.3 mg/dL (ref 8.4–10.5)
Chloride: 105 mEq/L (ref 96–112)
Creatinine, Ser: 0.85 mg/dL (ref 0.40–1.20)
GFR: 95.64 mL/min (ref >60.00)
Glucose, Bld: 111 mg/dL — ABNORMAL HIGH (ref 70–99)
Potassium: 3.7 mEq/L (ref 3.5–5.1)
Sodium: 138 mEq/L (ref 135–145)
Total Bilirubin: 0.2 mg/dL (ref 0.2–1.2)
Total Protein: 7.8 g/dL (ref 6.0–8.3)

## 2016-08-21 LAB — CBC WITH DIFFERENTIAL/PLATELET
Basophils Absolute: 0 10*3/uL (ref 0.0–0.1)
Basophils Relative: 0.7 % (ref 0.0–3.0)
Eosinophils Absolute: 0.2 10*3/uL (ref 0.0–0.7)
Eosinophils Relative: 4.1 % (ref 0.0–5.0)
HCT: 33.7 % — ABNORMAL LOW (ref 36.0–46.0)
Hemoglobin: 11.1 g/dL — ABNORMAL LOW (ref 12.0–15.0)
Lymphocytes Relative: 27.3 % (ref 12.0–46.0)
Lymphs Abs: 1.4 10*3/uL (ref 0.7–4.0)
MCHC: 32.8 g/dL (ref 30.0–36.0)
MCV: 79.5 fl (ref 78.0–100.0)
Monocytes Absolute: 0.5 10*3/uL (ref 0.1–1.0)
Monocytes Relative: 10.9 % (ref 3.0–12.0)
Neutro Abs: 2.8 10*3/uL (ref 1.4–7.7)
Neutrophils Relative %: 57 % (ref 43.0–77.0)
Platelets: 313 10*3/uL (ref 150.0–400.0)
RBC: 4.24 Mil/uL (ref 3.87–5.11)
RDW: 15.5 % (ref 11.5–15.5)
WBC: 5 10*3/uL (ref 4.0–10.5)

## 2016-08-21 LAB — LIPASE: Lipase: 29 U/L (ref 11.0–59.0)

## 2016-08-21 LAB — HEMOGLOBIN A1C: Hgb A1c MFr Bld: 6.3 % (ref 4.6–6.5)

## 2016-08-21 MED ORDER — OMEPRAZOLE 40 MG PO CPDR: 40.0000 mg | DELAYED_RELEASE_CAPSULE | Freq: Every day | ORAL | 3 refills | Status: DC

## 2016-08-21 NOTE — Patient Instructions (Signed)
  Test(s) ordered today. Your results will be released to [place] (or called to you) after review, usually within 72hours after test completion. If any changes need to be made, you will be notified at that same time.   Medications reviewed and updated.  Changes include starting omeprazole for your heartburn.  Your prescription(s) have been submitted to your pharmacy. Please take as directed and contact our office if you believe you are having problem(s) with the medication(s).   Please followup with Marya Amsler in the next couple of weeks

## 2016-08-21 NOTE — Assessment & Plan Note (Signed)
Diarrhea with black stool, some red stool Abdominal pain Diarrhea started after antibiotic Check stool studies Cbc, cmp, amylase, lipase Has been referred to GI

## 2016-08-21 NOTE — Assessment & Plan Note (Signed)
States frequent GERD, abdominal pain Reports black stool Start omeprazole 40 mg daily Check cbc Has been referred to GI

## 2016-08-21 NOTE — Assessment & Plan Note (Signed)
Check a1c 

## 2016-08-21 NOTE — Telephone Encounter (Signed)
Patient Name: Tracy Valenzuela  DOB: [birth year]    Initial Comment Caller states she is needing to schedule an appt. She is in pain, radiating, been on for a couple of weeks, hands and feet numb, but constant. Head feeling fuzzy, mother has lupus, and wondering what to do.    Nurse Assessment  Nurse: Leilani Merl, RN, Heather Date/Time (Eastern Time): 08/21/2016 12:44:08 PM  Confirm and document reason for call. If symptomatic, describe symptoms. ---Caller states she is needing to schedule an appt. She is in pain, radiating, been on for a couple of weeks, hands and feet numb, but constant. Head feeling fuzzy, mother has lupus, and wondering what to do.  Does the patient have any new or worsening symptoms? ---Yes  Will a triage be completed? ---Yes  Related visit to physician within the last 2 weeks? ---No  Does the PT have any chronic conditions? (i.e. diabetes, asthma, etc.) ---Yes  List chronic conditions. ---See MR  Is the patient pregnant or possibly pregnant? (Ask all females between the ages of 74-55) ---No  Is this a behavioral health or substance abuse call? ---No     Guidelines    Guideline Title Affirmed Question Affirmed Notes  Neurologic Deficit Neck pain (and neurologic deficit)    Final Disposition User   See Physician within 4 Hours (or PCP triage) Leilani Merl, RN, Heather    Comments  Appt today at 86 with Dr. Quay Burow.   Referrals  REFERRED TO PCP OFFICE   Disagree/Comply: Comply

## 2016-08-21 NOTE — Progress Notes (Signed)
Subjective:    Patient ID: Tracy Valenzuela, female    DOB: 20-May-1975, 41 y.o.   MRN: [id number]  HPI She is here for an acute visit.   Diarrhea:  She has had black stool or brownish-red stool.  She has 10-15 times a day.  It can be diarrhea to soft stool - it is grainy or mucus consistency.  She does have abdominal pain with this.  This started when she took the antibiotic two months ago and has gotten worse.  She has chills.  Her calves cramp.   She needs to be near a bathroom anytime she eats.  She has been referred to GI but has not heard from them yet.  She denies travel.    Body pain:  She has whole body pain that started a while ago. Her joints hurt.  She has tingling/numbness in her hands and feet.  She has intermittent lightheadedness.    Medications and allergies reviewed with patient and updated if appropriate.  Patient Active Problem List   Diagnosis Date Noted  . Strep pharyngitis 05/29/2016  . Thornwaldt's cyst 05/29/2016  . Viral respiratory infection 04/27/2016  . Vaginal bleeding 11/24/2015  . Morbid obesity ([place]) 11/09/2015  . Bipolar 1 disorder, mixed, moderate ([place]) 09/30/2015  . Decreased attention Span 09/30/2015  . Menometrorrhagia 10/19/2014  . Iron deficiency anemia 10/19/2014  . Anxiety and depression 10/19/2014  . Acute sinusitis 10/19/2014    Current Outpatient Prescriptions on File Prior to Visit  Medication Sig Dispense Refill  . clonazePAM (KLONOPIN) 0.5 MG tablet Take 1 tablet (0.5 mg total) by mouth 2 (two) times daily as needed. for anxiety 30 tablet 1  . megestrol (MEGACE) 20 MG tablet Take 1 tablet (20 mg total) by mouth 2 (two) times daily. 10 tablet 0  . QUEtiapine (SEROQUEL) 100 MG tablet Take 0.5-1 tablets (50-100 mg total) by mouth at bedtime. 90 tablet 1   No current facility-administered medications on file prior to visit.     Past Medical History:  Diagnosis Date  . ASCUS with positive high risk HPV 11/2014  . Asthma   . Bipolar 1  disorder ([place])   . Heart murmur   . LGSIL (low grade squamous intraepithelial dysplasia) 11/2014   colposcopy biopsy.  recommend follow up pap in one year  . Prediabetes     Past Surgical History:  Procedure Laterality Date  . CARDIAC CATHETERIZATION    . TUBAL LIGATION      Social History   Social History  . Marital status: Legally Separated    Spouse name: N/A  . Number of children: 3  . Years of education: 14   Occupational History  . Health care manager    Social History Main Topics  . Smoking status: Former Smoker    Types: Cigarettes    Quit date: 04/30/2014  . Smokeless tobacco: Never Used  . Alcohol use 0.0 oz/week     Comment: occasionally  . Drug use: No  . Sexual activity: Yes    Birth control/ protection: Surgical     Comment: intercourse age 86, sexual partners more than 5   Other Topics Concern  . Not on file   Social History Narrative   Fun: Sleep     Family History  Problem Relation Age of Onset  . Lupus Mother   . Heart disease Mother   . Diabetes Maternal Grandmother   . Heart disease Maternal Grandmother   . Hypertension Maternal Grandmother   .  Prostate cancer Maternal Grandfather   . Alzheimer's disease Paternal Grandfather     Review of Systems  Constitutional: Positive for chills. Negative for appetite change and fever.  Respiratory: Negative for cough, shortness of breath and wheezing.   Cardiovascular: Positive for leg swelling. Negative for chest pain and palpitations.  Gastrointestinal: Positive for abdominal pain, blood in stool and diarrhea. Negative for nausea.       Jerrye Bushy about 3 times a week  Musculoskeletal: Positive for arthralgias.  Neurological: Positive for light-headedness and numbness (in hands and feet). Negative for headaches.  Psychiatric/Behavioral:       Forgetfullness       Objective:   Vitals:   08/21/16 1605  BP: 120/74  Pulse: 72  Temp: 97.5 F (36.4 C)   Filed Weights   08/21/16 1605  Weight:  266 lb 12.8 oz (121 kg)   Body mass index is 43.06 kg/m.  Wt Readings from Last 3 Encounters:  08/21/16 266 lb 12.8 oz (121 kg)  05/29/16 267 lb (121.1 kg)  05/23/16 261 lb (118.4 kg)     Physical Exam  Constitutional: She appears well-developed and well-nourished. No distress.  HENT:  Head: Normocephalic and atraumatic.  Eyes: Conjunctivae are normal.  Neck: Neck supple. No tracheal deviation present. No thyromegaly present.  Cardiovascular: Normal rate, regular rhythm and normal heart sounds.   Pulmonary/Chest: Effort normal and breath sounds normal. No respiratory distress. She has no wheezes. She has no rales.  Abdominal: Soft. She exhibits no distension and no mass. There is tenderness (diffuse). There is no rebound and no guarding.  Musculoskeletal: She exhibits no edema.  Lymphadenopathy:    She has no cervical adenopathy.  Skin: Skin is warm and dry. She is not diaphoretic. No erythema.          Assessment & Plan:   See Problem List for Assessment and Plan of chronic medical problems.

## 2016-08-21 NOTE — Assessment & Plan Note (Signed)
Check labs Pain is diffuse and mild - I do not think we need imaging at this time Referred to GI already

## 2016-08-21 NOTE — Assessment & Plan Note (Signed)
Tingling in hands, feet Check B12 and a1c

## 2016-08-24 ENCOUNTER — Ambulatory Visit (INDEPENDENT_AMBULATORY_CARE_PROVIDER_SITE_OTHER): Payer: 59 | Admitting: Gastroenterology

## 2016-08-24 ENCOUNTER — Ambulatory Visit (INDEPENDENT_AMBULATORY_CARE_PROVIDER_SITE_OTHER): Payer: 59 | Admitting: Gynecology

## 2016-08-24 ENCOUNTER — Encounter: Payer: Self-pay | Admitting: Gastroenterology

## 2016-08-24 VITALS — BP 108/68 | HR 90 | Ht 66.0 in | Wt 262.0 lb

## 2016-08-24 DIAGNOSIS — D251 Intramural leiomyoma of uterus: Secondary | ICD-10-CM | POA: Diagnosis not present

## 2016-08-24 DIAGNOSIS — Z8742 Personal history of other diseases of the female genital tract: Secondary | ICD-10-CM

## 2016-08-24 DIAGNOSIS — R197 Diarrhea, unspecified: Secondary | ICD-10-CM

## 2016-08-24 DIAGNOSIS — R109 Unspecified abdominal pain: Secondary | ICD-10-CM

## 2016-08-24 MED ORDER — LEUPROLIDE ACETATE 3.75 MG IM KIT
3.7500 mg | PACK | Freq: Once | INTRAMUSCULAR | Status: AC
Start: 2016-08-24 — End: 2016-08-24
  Administered 2016-08-24: 3.75 mg via INTRAMUSCULAR

## 2016-08-24 MED ORDER — DICYCLOMINE HCL 10 MG PO CAPS: 10.0000 mg | ORAL_CAPSULE | Freq: Three times a day (TID) | ORAL | 1 refills | Status: DC | PRN

## 2016-08-24 MED ORDER — NA SULFATE-K SULFATE-MG SULF 17.5-3.13-1.6 GM/177ML PO SOLN: 1.0000 | Freq: Once | ORAL | 0 refills | Status: AC

## 2016-08-24 NOTE — Patient Instructions (Signed)
If you are age 39 or older, your body mass index should be between 23-30. Your Body mass index is 42.29 kg/m. If this is out of the aforementioned range listed, please consider follow up with your Primary Care Provider.  If you are age 14 or younger, your body mass index should be between 19-25. Your Body mass index is 42.29 kg/m. If this is out of the aformentioned range listed, please consider follow up with your Primary Care Provider.   We have sent the following medications to your pharmacy for you to pick up at your convenience:  [place] physician has requested that you go to the basement for the following lab work before leaving today:  GI pathogen panel  Fecal Lactoferrin  You have been scheduled for a colonoscopy. Please follow written instructions given to you at your visit today.  Please pick up your prep supplies at the pharmacy within the next 1-3 days. If you use inhalers (even only as needed), please bring them with you on the day of your procedure. Your physician has requested that you go to www.[email or web address].com and enter the access code given to you at your visit today. This web site gives a general overview about your procedure. However, you should still follow specific instructions given to you by our office regarding your preparation for the procedure.  Thank you.

## 2016-08-24 NOTE — Progress Notes (Signed)
HPI :  41 y/o female with history of asthma, bipolar disorder, pre-DM, here for a new patient visit for diarrhea and abdominal pain.  She is having diarrhea which has been bothering her since being on an antibiotic for "strep throat". She states was placed on Ceftin for 30 days and during that time she developed loose stools, which has persisted since that time. Total symptoms ongoing for 6 weeks or so up to this point. She has urgency to have a bowel movement shortly after eating. She is having at around 10 BMs per day. Stools are loose watery / or soft. She has seen some blood in the stools, sometimes dark or red.  She is having some abdominal pains with this, she thinks for at least 3 weeks. Pain located diffusely through her mid abdomen and feels "swollen" throughout. She is not having any nausea or vomiting. No fevers. She thinks she has lost a bit of weight She took one tablet of immodium but hasn't tried it routinely.  She has chronic anemia from heavy menses, she is scheduled for hysterectomy for this May 29th for definitive therapy..  She denies any NSAIDs.   No FH of colon cancer. No Crohns or colitis in the family.   No prior colonoscopy.   Past Medical History:  Diagnosis Date  . ASCUS with positive high risk HPV 11/2014  . Asthma   . Bipolar 1 disorder ([place])   . Heart murmur   . LGSIL (low grade squamous intraepithelial dysplasia) 11/2014   colposcopy biopsy.  recommend follow up pap in one year  . Prediabetes      Past Surgical History:  Procedure Laterality Date  . CARDIAC CATHETERIZATION    . TUBAL LIGATION     Family History  Problem Relation Age of Onset  . Lupus Mother   . Heart disease Mother   . Diabetes Maternal Grandmother   . Heart disease Maternal Grandmother   . Hypertension Maternal Grandmother   . Prostate cancer Maternal Grandfather   . Alzheimer's disease Paternal Grandfather   . Colon cancer Neg Hx   . Stomach cancer Neg Hx    Social  History  Substance Use Topics  . Smoking status: Former Smoker    Types: Cigarettes    Quit date: 04/30/2014  . Smokeless tobacco: Never Used  . Alcohol use 0.0 oz/week     Comment: occasionally   Current Outpatient Prescriptions  Medication Sig Dispense Refill  . clonazePAM (KLONOPIN) 0.5 MG tablet Take 1 tablet (0.5 mg total) by mouth 2 (two) times daily as needed. for anxiety 30 tablet 1  . omeprazole (PRILOSEC) 40 MG capsule Take 1 capsule (40 mg total) by mouth daily. Take 30 minutes prior to a meal 30 capsule 3  . QUEtiapine (SEROQUEL) 100 MG tablet Take 0.5-1 tablets (50-100 mg total) by mouth at bedtime. 90 tablet 1  . dicyclomine (BENTYL) 10 MG capsule Take 1-2 capsules (10-20 mg total) by mouth every 8 (eight) hours as needed for spasms. 30 capsule 1  . Na Sulfate-K Sulfate-Mg Sulf 17.5-3.13-1.6 GM/180ML SOLN Take 1 kit by mouth once. 354 mL 0   No current facility-administered medications for this visit.    Allergies  Allergen Reactions  . Penicillins Hives    Has patient had a PCN reaction causing immediate rash, facial/tongue/throat swelling, SOB or lightheadedness with hypotension:no Has patient had a PCN reaction causing severe rash involving mucus membranes or skin necrosis: Yes Has patient had a PCN reaction that required  hospitalization No Has patient had a PCN reaction occurring within the last 10 years: No If all of the above answers are "NO", then may proceed with Cephalosporin use.      Review of Systems: All systems reviewed and negative except where noted in HPI.   Lab Results  Component Value Date   WBC 5.0 08/21/2016   HGB 11.1 (L) 08/21/2016   HCT 33.7 (L) 08/21/2016   MCV 79.5 08/21/2016   PLT 313.0 08/21/2016    Lab Results  Component Value Date   CREATININE 0.85 08/21/2016   BUN 11 08/21/2016   NA 138 08/21/2016   K 3.7 08/21/2016   CL 105 08/21/2016   CO2 27 08/21/2016    Lab Results  Component Value Date   ALT 14 08/21/2016   AST  14 08/21/2016   ALKPHOS 67 08/21/2016   BILITOT 0.2 08/21/2016     Physical Exam: BP 108/68   Pulse 90   Ht 5' 6" (1.676 m)   Wt 262 lb (118.8 kg)   LMP  (LMP Unknown) Comment: neg beta hcg 05-23-16  BMI 42.29 kg/m  Constitutional: Pleasant,well-developed, female in no acute distress. HEENT: Normocephalic and atraumatic. Conjunctivae are normal. No scleral icterus. Neck supple.  Cardiovascular: Normal rate, regular rhythm.  Pulmonary/chest: Effort normal and breath sounds normal. No wheezing, rales or rhonchi. Abdominal: Soft, nondistended, mid abdominal TTP without rebound or guarding. here are no masses palpable. No hepatomegaly. Extremities: no edema Lymphadenopathy: No cervical adenopathy noted. Neurological: Alert and oriented to person place and time. Skin: Skin is warm and dry. No rashes noted. Psychiatric: Normal mood and affect. Behavior is normal.   ASSESSMENT AND PLAN: 41 year old female with persistent loose stools and crampy abdominal pain for the past 6 weeks in the setting of a long course of antibiotics. I discussed differential with her which includes infectious versus inflammatory diarrhea. Recommend GI pathogen panel given relatively acute nature of this and ensure she is negative for C. difficile. We'll also sense fecal lactoferrin. If she is positive for C. difficile and we will treat her and see if symptoms resolve. If she still studies negative for infectious causes we will proceed with colonoscopy, to be done the next few weeks. In the interim she can use Bentyl as needed for pain, and if her stool studies negative for C. difficile she can use Imodium as needed to treat diarrhea until colonoscopy is done. I discussed risk and benefits of colonoscopy with her and she wished proceed if needed. All questions answered.   [place], MD [facility] Pager [phone]  CC: [place], FNP

## 2016-08-29 ENCOUNTER — Other Ambulatory Visit: Payer: 59

## 2016-08-29 ENCOUNTER — Encounter: Payer: Self-pay | Admitting: Gastroenterology

## 2016-08-29 DIAGNOSIS — R7303 Prediabetes: Secondary | ICD-10-CM

## 2016-08-29 DIAGNOSIS — R197 Diarrhea, unspecified: Secondary | ICD-10-CM

## 2016-08-29 DIAGNOSIS — R109 Unspecified abdominal pain: Secondary | ICD-10-CM

## 2016-08-29 DIAGNOSIS — R202 Paresthesia of skin: Secondary | ICD-10-CM

## 2016-08-29 DIAGNOSIS — N921 Excessive and frequent menstruation with irregular cycle: Secondary | ICD-10-CM

## 2016-08-30 ENCOUNTER — Telehealth: Payer: Self-pay | Admitting: Gastroenterology

## 2016-08-30 LAB — GASTROINTESTINAL PATHOGEN PANEL PCR
C. difficile Tox A/B, PCR: DETECTED — CR
Campylobacter, PCR: NOT DETECTED
Cryptosporidium, PCR: NOT DETECTED
E coli (ETEC) LT/ST PCR: NOT DETECTED
E coli (STEC) stx1/stx2, PCR: NOT DETECTED
E coli 0157, PCR: NOT DETECTED
Giardia lamblia, PCR: NOT DETECTED
Norovirus, PCR: NOT DETECTED
Rotavirus A, PCR: NOT DETECTED
Salmonella, PCR: NOT DETECTED
Shigella, PCR: NOT DETECTED

## 2016-08-30 LAB — FECAL LACTOFERRIN, QUANT: Lactoferrin: POSITIVE

## 2016-08-30 NOTE — Telephone Encounter (Signed)
Lab called 5/3 evening with positive C difficile toxin result  Since Dr. Havery Moros is out of town I will prescribe treatment.  Vancomycin 125 mg, one tablet 4 times daily for 14 days.  Disp #56 tablets, RF zero  Hopefully her insurance will not decline the vancomycin, as it is now considered first line therapy over metronidazole.

## 2016-08-31 ENCOUNTER — Telehealth: Payer: Self-pay | Admitting: Gastroenterology

## 2016-08-31 ENCOUNTER — Other Ambulatory Visit: Payer: Self-pay

## 2016-08-31 MED ORDER — VANCOMYCIN HCL 125 MG PO CAPS: 125.0000 mg | ORAL_CAPSULE | Freq: Four times a day (QID) | ORAL | 0 refills | Status: AC

## 2016-08-31 MED ORDER — METRONIDAZOLE 500 MG PO TABS: 500.0000 mg | ORAL_TABLET | Freq: Three times a day (TID) | ORAL | 0 refills | Status: DC

## 2016-08-31 NOTE — Telephone Encounter (Signed)
Metronidazole sent to pharmacy. Pt informed.

## 2016-08-31 NOTE — Telephone Encounter (Signed)
Metronidazole is no longer recommended for therapy because of increasing resistance.  However, if she cannot afford the vancomycin, we will have to use it.  Please get it sent to her pharmacy today so she can start therapy immediately.  Metronidazole 500 mg, one tablet by mouth three times daily for 14 days.  Disp # 42 tablets, no refills.

## 2016-08-31 NOTE — Telephone Encounter (Signed)
Walked into the office because she is scared to go thru the weekend without hearing back from the office. Says she was advised that she could get worse if she doesn't begin treatment soon.

## 2016-08-31 NOTE — Telephone Encounter (Signed)
Dr. Cheral Bay prescribed vancomycin for this Dr. Havery Moros pt. Is there an alternative med to reat C. Diff that I can send in for her?

## 2016-08-31 NOTE — Telephone Encounter (Signed)
Patient advised of results, Rx sent to her pharmacy. Instructed to contact office if after finishing the medication she still is having symptoms.

## 2016-09-03 ENCOUNTER — Telehealth: Payer: Self-pay | Admitting: Gastroenterology

## 2016-09-03 NOTE — Telephone Encounter (Signed)
Patient tested positive for C-diff, has colonoscopy scheduled for 5/16. She started medication on 5/4. I told patient that we typically wait a little while after finishing her medication before rescheduling. How long do you want to wait before scheduling colonoscopy?

## 2016-09-04 ENCOUNTER — Ambulatory Visit: Payer: 59 | Admitting: Family

## 2016-09-04 ENCOUNTER — Telehealth: Payer: Self-pay | Admitting: Family

## 2016-09-04 NOTE — Telephone Encounter (Signed)
Ok to schedule if she calls back.

## 2016-09-04 NOTE — Telephone Encounter (Signed)
Patient no showed for two week fu on 5/8.  Please advise.

## 2016-09-05 NOTE — Telephone Encounter (Signed)
Spoke to patient, let her know that for now we are going to cancel her colonoscopy. She will finish her 14 day course of Flagyl. She understands to contact office if all her symptoms do not resolve. She states that she is getting better, not completely resolved yet.

## 2016-09-05 NOTE — Telephone Encounter (Signed)
Noted  

## 2016-09-05 NOTE — Telephone Encounter (Signed)
If all of her symptoms have completely resolved following treatment for C diff, then we may not need a colonoscopy. At the clinic visit she had complained of both diarrhea and rectal bleeding. If her rectal bleeding or diarrhea persists I would still recommend the colonoscopy. I would treat her for full course of C diff (10 or 14 days, not sure what she was given) and can then proceed with the procedure if needed. Again if all of her symptoms have completely resolved with therapy we can hold off on colonoscopy and she can see me back in clinic in a few months. Thanks

## 2016-09-12 ENCOUNTER — Encounter: Payer: 59 | Admitting: Gastroenterology

## 2016-09-13 NOTE — Patient Instructions (Addendum)
Your procedure is scheduled on:  Tuesday, 5/29  Enter through the Main Entrance of [facility] at: 6 am  Pick up the phone at the desk and dial [phone].  Call this number if you have problems the morning of surgery: [phone].  Remember: Do NOT eat or drink (including water) after midnight Monday.  Take these medicines the morning of surgery with a SIP OF WATER: klonopin and prilosec.  Do not smoke on the day of surgery.  Do NOT wear jewelry (body piercing), metal hair clips/bobby pins, make-up, or nail polish. Do NOT wear lotions, powders, or perfumes.  You may wear deoderant. Do NOT shave for 48 hours prior to surgery. Do NOT bring valuables to the hospital. Contacts, dentures, or bridgework may not be worn into surgery. Leave suitcase in car.  After surgery it may be brought to your room.  For patients admitted to the hospital, checkout time is 11:00 AM the day of discharge. Have a responsible adult drive you home and stay with you for 24 hours after your procedure

## 2016-09-14 ENCOUNTER — Telehealth: Payer: Self-pay | Admitting: *Deleted

## 2016-09-14 ENCOUNTER — Inpatient Hospital Stay (HOSPITAL_COMMUNITY)
Admission: RE | Admit: 2016-09-14 | Discharge: 2016-09-14 | Disposition: A | Discharge status: Home / Self Care | Payer: 59 | Source: Ambulatory Visit

## 2016-09-14 ENCOUNTER — Encounter: Payer: 59 | Admitting: Gynecology

## 2016-09-14 DIAGNOSIS — F32A Depression, unspecified: Secondary | ICD-10-CM

## 2016-09-14 DIAGNOSIS — F3162 Bipolar disorder, current episode mixed, moderate: Secondary | ICD-10-CM

## 2016-09-14 DIAGNOSIS — F329 Major depressive disorder, single episode, unspecified: Secondary | ICD-10-CM

## 2016-09-14 DIAGNOSIS — F419 Anxiety disorder, unspecified: Secondary | ICD-10-CM

## 2016-09-14 MED ORDER — QUETIAPINE FUMARATE 100 MG PO TABS: 100.0000 mg | ORAL_TABLET | Freq: Every day | ORAL | 0 refills | Status: DC

## 2016-09-14 MED ORDER — CLONAZEPAM 0.5 MG PO TABS: 0.5000 mg | ORAL_TABLET | Freq: Two times a day (BID) | ORAL | 1 refills | Status: DC | PRN

## 2016-09-14 NOTE — Telephone Encounter (Signed)
Medication has been faxed

## 2016-09-14 NOTE — Telephone Encounter (Signed)
Rec'd call pt requesting refills on her Seroquel and Clonazepam.../lmb

## 2016-09-14 NOTE — Telephone Encounter (Signed)
Medications printed and refilled.

## 2016-09-15 NOTE — Progress Notes (Signed)
This encounter was created in error - please disregard.

## 2016-09-17 ENCOUNTER — Other Ambulatory Visit: Payer: Self-pay

## 2016-09-17 ENCOUNTER — Telehealth: Payer: Self-pay | Admitting: Gastroenterology

## 2016-09-17 MED ORDER — NYSTATIN 100000 UNIT/ML MT SUSP: 5.0000 mL | Freq: Four times a day (QID) | OROMUCOSAL | 2 refills | Status: AC

## 2016-09-17 NOTE — Telephone Encounter (Signed)
Patient states that 2 days ago she noticed a white coating on her tongue, feels like it's going to the back of her throat now. She has one more day of the antibiotics. Please advise.

## 2016-09-17 NOTE — Telephone Encounter (Signed)
Patient advised, Rx sent to her pharmacy. She understands to contact our office in one week should she not be feeling better.

## 2016-09-17 NOTE — Telephone Encounter (Signed)
Okay, thanks for the update. Suspect thrush based on her symptoms in the setting of recent antibiotic use. Recommend Nystatin swish and swallow (400,000 to 600,000 units four times daily) for one week if you can order it. If no improvement despite this measure she should contact us in 1 week. Thanks

## 2016-09-19 ENCOUNTER — Encounter: Payer: Self-pay | Admitting: Gynecology

## 2016-09-19 ENCOUNTER — Telehealth: Payer: Self-pay

## 2016-09-19 NOTE — Telephone Encounter (Signed)
Patient called to re-schedule surgery that she had to cancel due to not being financially prepared.  She said she will be able to make her surgery prepymt middle of next mos.  Surgery scheduled for 10/30/16 at 10:00am at [facility].  Dr. Loetta Rough- Patient had Lupron Depot 11.25 on 05/18/16 and Lupron Depot 3.75 on 08/24/16.  Did you want her to try to get more Lupron prior to 10/30/16 surgery date. (Keep in mind it takes about 3 weeks to get the Lupron.)

## 2016-09-20 ENCOUNTER — Other Ambulatory Visit: Payer: Self-pay | Admitting: Gynecology

## 2016-09-21 ENCOUNTER — Telehealth: Payer: Self-pay

## 2016-09-21 NOTE — Telephone Encounter (Signed)
Yes to the DepoLupron

## 2016-09-21 NOTE — Telephone Encounter (Signed)
I called patient and left detailed message per DPR access note on file.  #1 Dr. Loetta Rough wants to try to do one more Lupron inj before surgery. If okay with her I will send Rx/ order over on Tuesday and she will hear from them.  #2. I received her FMLA forms. $25 fee due before I can send them out. Also, needs to sign release form. I discussed ways to obtain that.

## 2016-09-21 NOTE — Telephone Encounter (Signed)
Patient called me back in voice mail and said fine for Lupron. She said the pharmacy called her yesterday and told her time for another injection. I did receive refill request from [facility] this morning so once I confirm dose with Dr. Loetta Rough I will just okay the refill request in hopes that eliminates the need for new form to [facility].

## 2016-09-21 NOTE — Telephone Encounter (Signed)
I assume the 3.75 mg dose?

## 2016-09-24 NOTE — Telephone Encounter (Signed)
yes

## 2016-09-25 ENCOUNTER — Encounter (HOSPITAL_COMMUNITY): Admission: RE | Payer: Self-pay | Source: Ambulatory Visit

## 2016-09-25 ENCOUNTER — Ambulatory Visit (HOSPITAL_COMMUNITY): Admission: RE | Admit: 2016-09-25 | Payer: 59 | Source: Ambulatory Visit | Admitting: Gynecology

## 2016-09-25 SURGERY — HYSTERECTOMY, VAGINAL, LAPAROSCOPY-ASSISTED, WITH SALPINGECTOMY
Anesthesia: General | Laterality: Bilateral

## 2016-09-25 NOTE — Telephone Encounter (Signed)
[facility] has sent a refill request for the 3.75 mg. I okayed the refill. Patient said they had contacted her telling her it was time for another injection.  I did not send form to [facility] in light of this. We will see how this works out in a week or so.

## 2016-10-03 ENCOUNTER — Telehealth: Payer: Self-pay

## 2016-10-03 NOTE — Telephone Encounter (Signed)
I received staff message from Dr. Loetta Rough "I noticed that the patient has not had a Pap smear in 2 years. We should do this preoperatively. She does have a history of dysplasia. Have her come in for a Pap smear this week or beginning of next week. She can squeeze in anywhere for quick Pap smear. She will still have a preoperative consult separately I will not charge her for this visit. If it is abnormal then I will give [place] time to do colposcopy before the surgery."  I called patient and left message that I have message for her with recommendation from Dr. Loetta Rough and to please call me.

## 2016-10-04 MED ORDER — PHENAZOPYRIDINE HCL 200 MG PO TABS: ORAL_TABLET | ORAL | 0 refills | Status: DC

## 2016-10-04 NOTE — Telephone Encounter (Signed)
Patient called. I relayed this info to her and she is fine with that. Appt schedule for tomorrow.  I also told patient about need for Pyridium hs before surgery and am of surgery with a sip of water. Advised her will color her urine. Rx sent.

## 2016-10-05 ENCOUNTER — Ambulatory Visit (INDEPENDENT_AMBULATORY_CARE_PROVIDER_SITE_OTHER): Payer: 59 | Admitting: Gynecology

## 2016-10-05 ENCOUNTER — Encounter: Payer: Self-pay | Admitting: Gynecology

## 2016-10-05 VITALS — BP 124/78

## 2016-10-05 DIAGNOSIS — R87612 Low grade squamous intraepithelial lesion on cytologic smear of cervix (LGSIL): Secondary | ICD-10-CM

## 2016-10-05 NOTE — Addendum Note (Signed)
Addended by: Campbell Stall on: 10/05/2016 12:53 PM   Modules accepted: Orders

## 2016-10-05 NOTE — Patient Instructions (Signed)
Follow up for your preoperative appointment as scheduled

## 2016-10-05 NOTE — Progress Notes (Signed)
    Tracy Valenzuela Oct 16, 1975 [id number]        41 y.o.  [name] presents for Pap smear. She has an upcoming hysterectomy scheduled and has a history of ASCUS positive high-risk HPV 2016 with follow up colposcopy and biopsy showing LGSIL. Has not had a Pap smear since then.  Past medical history,surgical history, problem list, medications, allergies, family history and social history were all reviewed and documented in the EPIC chart.  Directed ROS with pertinent positives and negatives documented in the history of present illness/assessment and plan.  Exam: Tracy Valenzuela assistant Vitals:   10/05/16 1234  BP: 124/78   General appearance:  Normal Abdomen soft nontender without masses guarding rebound Pelvic external BUS vagina normal. Cervix normal. Pap smear done. Uterus grossly normal midline mobile nontender. Adnexa without masses or tenderness.  Assessment/Plan:  41 y.o. [id number] for upcoming hysterectomy with history of LGSIL in the past with no recent Pap smear. Pap smear done today.    Tracy Auerbach MD, 12:44 PM 10/05/2016

## 2016-10-09 LAB — PAP IG W/ RFLX HPV ASCU

## 2016-10-15 ENCOUNTER — Ambulatory Visit (INDEPENDENT_AMBULATORY_CARE_PROVIDER_SITE_OTHER): Payer: 59 | Admitting: Gynecology

## 2016-10-15 ENCOUNTER — Encounter: Payer: Self-pay | Admitting: Gynecology

## 2016-10-15 VITALS — BP 118/78

## 2016-10-15 DIAGNOSIS — R87612 Low grade squamous intraepithelial lesion on cytologic smear of cervix (LGSIL): Secondary | ICD-10-CM

## 2016-10-15 NOTE — Patient Instructions (Signed)
Follow up for your preoperative consult as scheduled

## 2016-10-15 NOTE — Progress Notes (Signed)
    Tracy Valenzuela [birth year] [id number]        41 y.o.  [id number] presents for colposcopy. History of ASCUS with positive high-risk HPV 2016. Colposcopy and biopsy showed LGSIL. Pap smear 09/2016 showed LGSIL. Scheduled for upcoming hysterectomy next month.  Past medical history,surgical history, problem list, medications, allergies, family history and social history were all reviewed and documented in the EPIC chart.  Directed ROS with pertinent positives and negatives documented in the history of present illness/assessment and plan.  Exam: [name] assistant Vitals:   10/15/16 1025  BP: 118/78   General appearance:  Normal Abdomen soft nontender without masses guarding rebound Pelvic external BUS vagina normal. Cervix grossly normal. Uterus grossly normal midline mobile nontender. Adnexa without masses or tenderness.  Colposcopy performed after acetic acid cleanse was adequate with transformation zone seen 360. No abnormalities seen. No biopsies taken. Physical Exam  Genitourinary:       Assessment/Plan:  41 y.o. [id number] with history of LGSIL. Colposcopy was adequate and normal. No biopsies were taken as patient is planning for hysterectomy next month which will be the ultimate excisional procedure. Colposcopy was performed just to make sure no high-grade changes were visualized. Patient has preoperative consult and will follow up for this.    Anastasio Auerbach MD, 10:43 AM 10/15/2016

## 2016-10-19 ENCOUNTER — Telehealth: Payer: Self-pay

## 2016-10-19 ENCOUNTER — Other Ambulatory Visit: Payer: Self-pay | Admitting: Gynecology

## 2016-10-19 MED ORDER — MEGESTROL ACETATE 40 MG PO TABS: ORAL_TABLET | ORAL | 0 refills | Status: DC

## 2016-10-19 NOTE — Patient Instructions (Signed)
Your procedure is scheduled on:  Tuesday, October 30, 2016  Enter through the [employer] of [facility] at:  8:00 AM  Pick up the phone at the desk and dial [phone].  Call this number if you have problems the morning of surgery: [phone].  Remember: Do NOT eat food or drink after:  Midnight Monday  Take these medicines the morning of surgery with a SIP OF WATER:  Pyridium as directed by surgeon, Clonazepam if needed  Stop ALL herbal medications at this time  Do NOT smoke the day of surgery.  Do NOT wear jewelry (body piercing), metal hair clips/bobby pins, make-up, artifical eyelashes or nail polish. Do NOT wear lotions, powders, or perfumes.  You may wear deodorant. Do NOT shave for 48 hours prior to surgery. Do NOT bring valuables to the hospital. Contacts, dentures, or bridgework may not be worn into surgery.  Leave suitcase in car.  After surgery it may be brought to your room.  For patients admitted to the hospital, checkout time is 11:00 AM the day of discharge.  Bring a copy of your healthcare power of attorney and living will documents.

## 2016-10-19 NOTE — Telephone Encounter (Signed)
I  Called patient on 10/08/16 and spoke with her and told her that pharmacy is simply waiting on her verbal consent to ship the Lupron.  I provided # to call. I received another fax from pharmacy on 10/12/16 stating they were still waiting for her to call. I called her again today and left voice mail message that she needs to call and provided the phone number once again.

## 2016-10-19 NOTE — Telephone Encounter (Signed)
I called patient because I spoke with [facility] and they were going to ship Lupron on 6/26 and her hysterectomy 7/6.  I spoke with Dr. Loetta Rough who said no need for Lupron at this point. Dr. Loetta Rough did want me to send Rx for Megace 40 mg so that patient will have it on hand incase any bleeding starts to hopefully keep her from bleeding between now and surgery. Rx sent. I called patient and got her voice mail. Per DPR access note on file I explained all this to her.

## 2016-10-22 ENCOUNTER — Encounter: Payer: Self-pay | Admitting: Gynecology

## 2016-10-22 ENCOUNTER — Ambulatory Visit (INDEPENDENT_AMBULATORY_CARE_PROVIDER_SITE_OTHER): Payer: 59 | Admitting: Gynecology

## 2016-10-22 ENCOUNTER — Encounter (HOSPITAL_COMMUNITY): Payer: Self-pay

## 2016-10-22 ENCOUNTER — Encounter (HOSPITAL_COMMUNITY)
Admission: RE | Admit: 2016-10-22 | Discharge: 2016-10-22 | Disposition: A | Discharge status: Home / Self Care | Payer: 59 | Source: Ambulatory Visit | Attending: Gynecology | Admitting: Gynecology

## 2016-10-22 VITALS — BP 122/78

## 2016-10-22 DIAGNOSIS — R87612 Low grade squamous intraepithelial lesion on cytologic smear of cervix (LGSIL): Secondary | ICD-10-CM | POA: Diagnosis not present

## 2016-10-22 DIAGNOSIS — N920 Excessive and frequent menstruation with regular cycle: Secondary | ICD-10-CM | POA: Diagnosis not present

## 2016-10-22 DIAGNOSIS — D259 Leiomyoma of uterus, unspecified: Secondary | ICD-10-CM | POA: Diagnosis not present

## 2016-10-22 DIAGNOSIS — D500 Iron deficiency anemia secondary to blood loss (chronic): Secondary | ICD-10-CM

## 2016-10-22 DIAGNOSIS — Z01812 Encounter for preprocedural laboratory examination: Secondary | ICD-10-CM | POA: Diagnosis not present

## 2016-10-22 HISTORY — DX: Gastro-esophageal reflux disease without esophagitis: K21.9

## 2016-10-22 HISTORY — DX: Prediabetes: R73.03

## 2016-10-22 LAB — COMPREHENSIVE METABOLIC PANEL
ALT: 21 U/L (ref 14–54)
AST: 19 U/L (ref 15–41)
Albumin: 3.6 g/dL (ref 3.5–5.0)
Alkaline Phosphatase: 70 U/L (ref 38–126)
Anion gap: 7 (ref 5–15)
BUN: 13 mg/dL (ref 6–20)
CO2: 26 mmol/L (ref 22–32)
Calcium: 9 mg/dL (ref 8.9–10.3)
Chloride: 104 mmol/L (ref 101–111)
Creatinine, Ser: 0.74 mg/dL (ref 0.44–1.00)
GFR calc Af Amer: >60 mL/min (ref >60)
GFR calc non Af Amer: >60 mL/min (ref >60)
Glucose, Bld: 130 mg/dL — ABNORMAL HIGH (ref 65–99)
Potassium: 3.7 mmol/L (ref 3.5–5.1)
Sodium: 137 mmol/L (ref 135–145)
Total Bilirubin: 0.6 mg/dL (ref 0.3–1.2)
Total Protein: 7.8 g/dL (ref 6.5–8.1)

## 2016-10-22 LAB — TYPE AND SCREEN
ABO/RH(D): O POS
Antibody Screen: NEGATIVE

## 2016-10-22 LAB — CBC
HCT: 35.8 % — ABNORMAL LOW (ref 36.0–46.0)
Hemoglobin: 11.5 g/dL — ABNORMAL LOW (ref 12.0–15.0)
MCH: 26.8 pg (ref 26.0–34.0)
MCHC: 32.1 g/dL (ref 30.0–36.0)
MCV: 83.4 fL (ref 78.0–100.0)
Platelets: 281 10*3/uL (ref 150–400)
RBC: 4.29 MIL/uL (ref 3.87–5.11)
RDW: 15.6 % — ABNORMAL HIGH (ref 11.5–15.5)
WBC: 3.9 10*3/uL — ABNORMAL LOW (ref 4.0–10.5)

## 2016-10-22 NOTE — Progress Notes (Addendum)
Tracy Valenzuela 22-Apr-1976 [id number]   Preoperative consult  Chief complaint: Leiomyoma, menorrhagia, iron deficiency anemia, LGSIL  History of present illness: 41 y.o. [id number] with history of worsening menorrhagia to include double protection and bleedthrough episodes.  Sonohysterogram showed negative endometrial cavity with endometrial biopsy showing secretory endometrium. Ultrasound also showed several myoma most recently 03/2016 with 6.6 cm myoma and 2.3 mm myoma.  Had bled heavily with hemoglobin of 7 in August 2017.. Subsequently had menstrual suppression with Megace and ultimately Depo-Lupron since the beginning of this year now with menstrual suppression and most recent hemoglobin 11.7. Status post BTL in the past with 3 vaginal deliveries. Options for management reviewed to include continued hormonal manipulation, Mirena IUD, endometrial ablation, uterine artery embolization, surgery to include conservative with myomectomy and definitive with hysterectomy. The patient strongly wants to proceed with hysterectomy and is admitted for LAVH, bilateral salpingectomies. Patient also has a history of LGSIL and positive high-risk HPV dating back to 2016. Most recent Pap smear 09/2016 showed LGSIL. Follow up colposcopy 09/2016 was adequate and normal with no significant disease noted.  Past medical history,surgical history, medications, allergies, family history and social history were all reviewed and documented in the EPIC chart.  ROS:  Was performed and pertinent positives and negatives are included in the history of present illness.  Exam:  [name] assistant Vitals:   10/22/16 1120  BP: 122/78   General: well developed, well nourished female, no acute distress HEENT: normal  Lungs: clear to auscultation without wheezing, rales or rhonchi  Cardiac: regular rate without rubs, murmurs or gallops  Abdomen: soft, nontender without masses, guarding, rebound, organomegaly  10/15/2016 Pelvic:  external bus vagina: normal   Cervix: grossly normal  Uterus: normal size, midline and mobile, nontender  Adnexa: without masses or tenderness    Assessment/Plan:  41 y.o. [id number] with history of leiomyoma and significant menorrhagia to hemoglobin of 7. Sonohysterogram was negative other than showing her leiomyoma with negative endometrial biopsy. Status post BTL in the past. Options for management reviewed and patient ultimately wants to proceed with LAVH bilateral salpingectomies. Patient understands that at any time during the procedure I may convert to a TAH approach with a larger incision and a longer recovery.  The absolute and irreversible sterility of hysterectomy was also discussed and accepted. Sexuality following hysterectomy was also reviewed in the risks of persistent dyspareunia and persistent orgasmic dysfunction was discussed understood and accepted. The ovarian conservation issue was reviewed. The options of removing both ovaries or keeping both ovaries for continued hormone production was discussed. Benefits of continued hormone production at her younger age from a cardiovascular/bone health standpoint as well as from symptom relief reviewed. Risks of keeping both ovaries were also discussed to include pain or benign disease requiring reoperation and the risks of ovarian cancer in the future. Patient has no strong family history of ovarian disease and wants to keep both ovaries. She accepts the risks of ovarian disease and cancer in the future. She does give me permission to remove one or both ovaries at the time of surgery if it is my best intraoperative decision to do so. We also plan on bilateral salpingectomies per current SGO recommendations for risk reductive surgery.  She does have a history of LGSIL with positive high-risk HPV. She understands that this is viral related and that there is no guarantee that she will not have persistent dysplasia at the vaginal cuff and may require  treatments in the future and that we will plan  to monitor with Pap smears for now on an annual basis.. The expected intraoperative and postoperative courses as well as the recovery period were reviewed. The risks of infection, prolonged antibiotics, reoperation for abscess or hematoma formation was discussed. The risks of hemorrhage necessitating transfusion and the risks of transfusion reaction, hepatitis, HIV, mad cow disease and other unknown entities was also discussed. Incisional complications to include opening and draining of incisions and closure by secondary intention, dehiscence and long-term issues of keloid/cosmetics and hernia formation were reviewed. The risk of inadvertent injury to internal organs including bowel, bladder, ureters, vessels, nerves either immediately recognized or delay recognized necessitating major exploratory reparative surgeries and future reparative surgeries including bowel resection, ostomy formation, bladder repair, ureteral damage repair was discussed with her. The patient's questions were answered to her satisfaction and she is ready to proceed with surgery.     Anastasio Auerbach MD, 11:48 AM 10/22/2016

## 2016-10-22 NOTE — Patient Instructions (Signed)
Followup for surgery as scheduled. 

## 2016-10-22 NOTE — H&P (Signed)
Aubriegh Minch [birth year] [id number]   History and Physical  Chief complaint: Leiomyoma, menorrhagia, iron deficiency anemia, LGSIL  History of present illness: 41 y.o. [id number] with history of worsening menorrhagia to include double protection and bleedthrough episodes.  Sonohysterogram showed negative endometrial cavity with endometrial biopsy showing secretory endometrium. Ultrasound also showed several myoma most recently 03/2016 with 6.6 cm myoma and 2.3 mm myoma.  Had bled heavily with hemoglobin of 7 in August 2017.. Subsequently had menstrual suppression with Megace and ultimately Depo-Lupron since the beginning of this year now with menstrual suppression and most recent hemoglobin 11.7. Status post BTL in the past with 3 vaginal deliveries. Options for management reviewed to include continued hormonal manipulation, Mirena IUD, endometrial ablation, uterine artery embolization, surgery to include conservative with myomectomy and definitive with hysterectomy. The patient strongly wants to proceed with hysterectomy and is admitted for LAVH, bilateral salpingectomies. Patient also has a history of LGSIL and positive high-risk HPV dating back to 2016. Most recent Pap smear 09/2016 showed LGSIL. Follow up colposcopy 09/2016 was adequate and normal with no significant disease noted.  Past medical history,surgical history, medications, allergies, family history and social history were all reviewed and documented in the EPIC chart.  ROS:  Was performed and pertinent positives and negatives are included in the history of present illness.  Exam:  [name] assistant Vitals:   10/22/16 1120  BP: 122/78   General: well developed, well nourished female, no acute distress HEENT: normal  Lungs: clear to auscultation without wheezing, rales or rhonchi  Cardiac: regular rate without rubs, murmurs or gallops  Abdomen: soft, nontender without masses, guarding, rebound, organomegaly  10/15/2016 Pelvic:  external bus vagina: normal   Cervix: grossly normal  Uterus: normal size, midline and mobile, nontender  Adnexa: without masses or tenderness    Assessment/Plan:  41 y.o. [id number] with history of leiomyoma and significant menorrhagia to hemoglobin of 7. Sonohysterogram was negative other than showing her leiomyoma with negative endometrial biopsy. Status post BTL in the past. Options for management reviewed and patient ultimately wants to proceed with LAVH bilateral salpingectomies. Patient understands that at any time during the procedure I may convert to a TAH approach with a larger incision and a longer recovery.  The absolute and irreversible sterility of hysterectomy was also discussed and accepted. Sexuality following hysterectomy was also reviewed in the risks of persistent dyspareunia and persistent orgasmic dysfunction was discussed understood and accepted. The ovarian conservation issue was reviewed. The options of removing both ovaries or keeping both ovaries for continued hormone production was discussed. Benefits of continued hormone production at her younger age from a cardiovascular/bone health standpoint as well as from symptom relief reviewed. Risks of keeping both ovaries were also discussed to include pain or benign disease requiring reoperation and the risks of ovarian cancer in the future. Patient has no strong family history of ovarian disease and wants to keep both ovaries. She accepts the risks of ovarian disease and cancer in the future. She does give me permission to remove one or both ovaries at the time of surgery if it is my best intraoperative decision to do so. We also plan on bilateral salpingectomies per current SGO recommendations for risk reductive surgery.  She does have a history of LGSIL with positive high-risk HPV. She understands that this is viral related and that there is no guarantee that she will not have persistent dysplasia at the vaginal cuff and may require  treatments in the future and that we will  plan to monitor with Pap smears for now on an annual basis.. The expected intraoperative and postoperative courses as well as the recovery period were reviewed. The risks of infection, prolonged antibiotics, reoperation for abscess or hematoma formation was discussed. The risks of hemorrhage necessitating transfusion and the risks of transfusion reaction, hepatitis, HIV, mad cow disease and other unknown entities was also discussed. Incisional complications to include opening and draining of incisions and closure by secondary intention, dehiscence and long-term issues of keloid/cosmetics and hernia formation were reviewed. The risk of inadvertent injury to internal organs including bowel, bladder, ureters, vessels, nerves either immediately recognized or delay recognized necessitating major exploratory reparative surgeries and future reparative surgeries including bowel resection, ostomy formation, bladder repair, ureteral damage repair was discussed with her. The patient's questions were answered to her satisfaction and she is ready to proceed with surgery.     Anastasio Auerbach MD, 12:02 PM 10/22/2016

## 2016-10-30 ENCOUNTER — Encounter (HOSPITAL_COMMUNITY): Payer: Self-pay

## 2016-10-30 ENCOUNTER — Ambulatory Visit (HOSPITAL_COMMUNITY): Payer: 59 | Admitting: Anesthesiology

## 2016-10-30 ENCOUNTER — Ambulatory Visit (HOSPITAL_COMMUNITY)
Admission: RE | Admit: 2016-10-30 | Discharge: 2016-10-31 | Disposition: A | Discharge status: Home / Self Care | Payer: 59 | Source: Ambulatory Visit | Attending: Gynecology | Admitting: Gynecology

## 2016-10-30 ENCOUNTER — Telehealth: Payer: Self-pay

## 2016-10-30 ENCOUNTER — Encounter (HOSPITAL_COMMUNITY)
Admission: RE | Disposition: A | Discharge status: Home / Self Care | Payer: Self-pay | Source: Ambulatory Visit | Attending: Gynecology

## 2016-10-30 DIAGNOSIS — K219 Gastro-esophageal reflux disease without esophagitis: Secondary | ICD-10-CM | POA: Insufficient documentation

## 2016-10-30 DIAGNOSIS — D509 Iron deficiency anemia, unspecified: Secondary | ICD-10-CM | POA: Insufficient documentation

## 2016-10-30 DIAGNOSIS — Z87891 Personal history of nicotine dependence: Secondary | ICD-10-CM | POA: Diagnosis not present

## 2016-10-30 DIAGNOSIS — D500 Iron deficiency anemia secondary to blood loss (chronic): Secondary | ICD-10-CM | POA: Diagnosis not present

## 2016-10-30 DIAGNOSIS — F319 Bipolar disorder, unspecified: Secondary | ICD-10-CM | POA: Insufficient documentation

## 2016-10-30 DIAGNOSIS — N920 Excessive and frequent menstruation with regular cycle: Secondary | ICD-10-CM | POA: Insufficient documentation

## 2016-10-30 DIAGNOSIS — N838 Other noninflammatory disorders of ovary, fallopian tube and broad ligament: Secondary | ICD-10-CM | POA: Diagnosis not present

## 2016-10-30 DIAGNOSIS — D259 Leiomyoma of uterus, unspecified: Secondary | ICD-10-CM | POA: Diagnosis not present

## 2016-10-30 DIAGNOSIS — Z23 Encounter for immunization: Secondary | ICD-10-CM | POA: Diagnosis not present

## 2016-10-30 DIAGNOSIS — N840 Polyp of corpus uteri: Secondary | ICD-10-CM | POA: Diagnosis not present

## 2016-10-30 DIAGNOSIS — N870 Mild cervical dysplasia: Secondary | ICD-10-CM | POA: Diagnosis not present

## 2016-10-30 DIAGNOSIS — R87612 Low grade squamous intraepithelial lesion on cytologic smear of cervix (LGSIL): Secondary | ICD-10-CM | POA: Diagnosis not present

## 2016-10-30 DIAGNOSIS — D219 Benign neoplasm of connective and other soft tissue, unspecified: Secondary | ICD-10-CM | POA: Diagnosis present

## 2016-10-30 DIAGNOSIS — Z6841 Body Mass Index (BMI) 40.0 and over, adult: Secondary | ICD-10-CM | POA: Diagnosis not present

## 2016-10-30 DIAGNOSIS — N736 Female pelvic peritoneal adhesions (postinfective): Secondary | ICD-10-CM | POA: Diagnosis not present

## 2016-10-30 HISTORY — PX: LAPAROSCOPIC VAGINAL HYSTERECTOMY WITH SALPINGECTOMY: SHX6680

## 2016-10-30 HISTORY — PX: CYSTOSCOPY: SHX5120

## 2016-10-30 LAB — PREGNANCY, URINE: Preg Test, Ur: NEGATIVE

## 2016-10-30 SURGERY — HYSTERECTOMY, VAGINAL, LAPAROSCOPY-ASSISTED, WITH SALPINGECTOMY
Anesthesia: General | Site: Bladder | Laterality: Bilateral

## 2016-10-30 MED ORDER — FENTANYL CITRATE (PF) 100 MCG/2ML IJ SOLN
INTRAMUSCULAR | Status: DC | PRN
  Administered 2016-10-30: 25 ug via INTRAVENOUS
  Administered 2016-10-30: 100 ug via INTRAVENOUS
  Administered 2016-10-30: 25 ug via INTRAVENOUS
  Administered 2016-10-30 (×2): 50 ug via INTRAVENOUS

## 2016-10-30 MED ORDER — ONDANSETRON HCL 4 MG PO TABS: 4.0000 mg | ORAL_TABLET | Freq: Four times a day (QID) | ORAL | Status: DC | PRN

## 2016-10-30 MED ORDER — DEXAMETHASONE SODIUM PHOSPHATE 10 MG/ML IJ SOLN
INTRAMUSCULAR | Status: DC | PRN
  Administered 2016-10-30: 8 mg via INTRAVENOUS

## 2016-10-30 MED ORDER — PROMETHAZINE HCL 25 MG/ML IJ SOLN: 6.2500 mg | INTRAMUSCULAR | Status: DC | PRN

## 2016-10-30 MED ORDER — MEPERIDINE HCL 25 MG/ML IJ SOLN: 6.2500 mg | INTRAMUSCULAR | Status: DC | PRN

## 2016-10-30 MED ORDER — HYDROMORPHONE HCL 1 MG/ML IJ SOLN
INTRAMUSCULAR | Status: AC
  Administered 2016-10-30: 0.5 mg via INTRAVENOUS
  Filled 2016-10-30: qty 0.5

## 2016-10-30 MED ORDER — MIDAZOLAM HCL 2 MG/2ML IJ SOLN
INTRAMUSCULAR | Status: AC
  Filled 2016-10-30: qty 2

## 2016-10-30 MED ORDER — QUETIAPINE FUMARATE 100 MG PO TABS
100.0000 mg | ORAL_TABLET | Freq: Every day | ORAL | Status: DC
  Administered 2016-10-30: 100 mg via ORAL
  Filled 2016-10-30: qty 1

## 2016-10-30 MED ORDER — KETOROLAC TROMETHAMINE 30 MG/ML IJ SOLN: 30.0000 mg | Freq: Four times a day (QID) | INTRAMUSCULAR | Status: DC

## 2016-10-30 MED ORDER — PNEUMOCOCCAL VAC POLYVALENT 25 MCG/0.5ML IJ INJ
0.5000 mL | INJECTION | INTRAMUSCULAR | Status: AC
  Administered 2016-10-31: 0.5 mL via INTRAMUSCULAR
  Filled 2016-10-30: qty 0.5

## 2016-10-30 MED ORDER — BUPIVACAINE HCL (PF) 0.25 % IJ SOLN
INTRAMUSCULAR | Status: AC
  Filled 2016-10-30: qty 30

## 2016-10-30 MED ORDER — ONDANSETRON HCL 4 MG/2ML IJ SOLN
INTRAMUSCULAR | Status: AC
  Filled 2016-10-30: qty 2

## 2016-10-30 MED ORDER — DIPHENHYDRAMINE HCL 25 MG PO CAPS: 50.0000 mg | ORAL_CAPSULE | Freq: Four times a day (QID) | ORAL | Status: DC | PRN

## 2016-10-30 MED ORDER — KETOROLAC TROMETHAMINE 30 MG/ML IJ SOLN
INTRAMUSCULAR | Status: AC
Start: 2016-10-30 — End: 2016-10-30
  Filled 2016-10-30: qty 1

## 2016-10-30 MED ORDER — DEXTROSE-NACL 5-0.9 % IV SOLN
INTRAVENOUS | Status: DC
  Administered 2016-10-30: 16:00:00 via INTRAVENOUS

## 2016-10-30 MED ORDER — LACTATED RINGERS IR SOLN
Status: DC | PRN
  Administered 2016-10-30: 3000 mL

## 2016-10-30 MED ORDER — DICYCLOMINE HCL 10 MG PO CAPS
10.0000 mg | ORAL_CAPSULE | Freq: Three times a day (TID) | ORAL | Status: DC | PRN
  Filled 2016-10-30: qty 1

## 2016-10-30 MED ORDER — ONDANSETRON HCL 4 MG/2ML IJ SOLN
INTRAMUSCULAR | Status: DC | PRN
  Administered 2016-10-30: 4 mg via INTRAVENOUS

## 2016-10-30 MED ORDER — SUGAMMADEX SODIUM 500 MG/5ML IV SOLN
INTRAVENOUS | Status: DC | PRN
  Administered 2016-10-30: 250 mg via INTRAVENOUS

## 2016-10-30 MED ORDER — OXYCODONE-ACETAMINOPHEN 5-325 MG PO TABS
1.0000 | ORAL_TABLET | ORAL | Status: DC | PRN
  Administered 2016-10-30 – 2016-10-31 (×3): 2 via ORAL
  Filled 2016-10-30 (×3): qty 2

## 2016-10-30 MED ORDER — HYDROMORPHONE HCL 1 MG/ML IJ SOLN
0.2500 mg | INTRAMUSCULAR | Status: DC | PRN
  Administered 2016-10-30: 0.5 mg via INTRAVENOUS

## 2016-10-30 MED ORDER — BUPIVACAINE HCL (PF) 0.25 % IJ SOLN
INTRAMUSCULAR | Status: DC | PRN
  Administered 2016-10-30: 7 mL

## 2016-10-30 MED ORDER — KETOROLAC TROMETHAMINE 30 MG/ML IJ SOLN
INTRAMUSCULAR | Status: DC | PRN
  Administered 2016-10-30: 30 mg via INTRAVENOUS

## 2016-10-30 MED ORDER — LIDOCAINE-EPINEPHRINE 1 %-1:100000 IJ SOLN
INTRAMUSCULAR | Status: DC | PRN
  Administered 2016-10-30: 10 mL

## 2016-10-30 MED ORDER — ONDANSETRON HCL 4 MG/2ML IJ SOLN: 4.0000 mg | Freq: Four times a day (QID) | INTRAMUSCULAR | Status: DC | PRN

## 2016-10-30 MED ORDER — KETOROLAC TROMETHAMINE 30 MG/ML IJ SOLN
30.0000 mg | Freq: Four times a day (QID) | INTRAMUSCULAR | Status: DC
  Administered 2016-10-30 – 2016-10-31 (×3): 30 mg via INTRAVENOUS
  Filled 2016-10-30 (×3): qty 1

## 2016-10-30 MED ORDER — LACTATED RINGERS IV SOLN
INTRAVENOUS | Status: DC
  Administered 2016-10-30 (×2): via INTRAVENOUS

## 2016-10-30 MED ORDER — SCOPOLAMINE 1 MG/3DAYS TD PT72
1.0000 | MEDICATED_PATCH | Freq: Once | TRANSDERMAL | Status: DC
  Administered 2016-10-30: 1.5 mg via TRANSDERMAL

## 2016-10-30 MED ORDER — GENTAMICIN SULFATE 40 MG/ML IJ SOLN
INTRAVENOUS | Status: AC
  Administered 2016-10-30: 116.5 mL via INTRAVENOUS
  Filled 2016-10-30: qty 10.5

## 2016-10-30 MED ORDER — MORPHINE SULFATE (PF) 4 MG/ML IV SOLN
1.0000 mg | INTRAVENOUS | Status: DC | PRN
  Administered 2016-10-30: 2 mg via INTRAVENOUS
  Filled 2016-10-30: qty 1

## 2016-10-30 MED ORDER — ROCURONIUM BROMIDE 100 MG/10ML IV SOLN
INTRAVENOUS | Status: AC
  Filled 2016-10-30: qty 1

## 2016-10-30 MED ORDER — KETOROLAC TROMETHAMINE 30 MG/ML IJ SOLN: 30.0000 mg | Freq: Once | INTRAMUSCULAR | Status: DC | PRN

## 2016-10-30 MED ORDER — PROPOFOL 10 MG/ML IV BOLUS
INTRAVENOUS | Status: DC | PRN
  Administered 2016-10-30: 250 mg via INTRAVENOUS

## 2016-10-30 MED ORDER — DEXAMETHASONE SODIUM PHOSPHATE 10 MG/ML IJ SOLN
INTRAMUSCULAR | Status: AC
  Filled 2016-10-30: qty 1

## 2016-10-30 MED ORDER — LIDOCAINE HCL (CARDIAC) 20 MG/ML IV SOLN
INTRAVENOUS | Status: AC
  Filled 2016-10-30: qty 5

## 2016-10-30 MED ORDER — FENTANYL CITRATE (PF) 250 MCG/5ML IJ SOLN
INTRAMUSCULAR | Status: AC
  Filled 2016-10-30: qty 5

## 2016-10-30 MED ORDER — SUGAMMADEX SODIUM 500 MG/5ML IV SOLN
INTRAVENOUS | Status: AC
  Filled 2016-10-30: qty 5

## 2016-10-30 MED ORDER — PROPOFOL 10 MG/ML IV BOLUS
INTRAVENOUS | Status: AC
  Filled 2016-10-30: qty 40

## 2016-10-30 MED ORDER — SUGAMMADEX SODIUM 200 MG/2ML IV SOLN
INTRAVENOUS | Status: AC
  Filled 2016-10-30: qty 2

## 2016-10-30 MED ORDER — LIDOCAINE HCL (CARDIAC) 20 MG/ML IV SOLN
INTRAVENOUS | Status: DC | PRN
  Administered 2016-10-30: 100 mg via INTRAVENOUS

## 2016-10-30 MED ORDER — STERILE WATER FOR IRRIGATION IR SOLN
Status: DC | PRN
  Administered 2016-10-30: 1000 mL

## 2016-10-30 MED ORDER — GLYCOPYRROLATE 0.2 MG/ML IJ SOLN
INTRAMUSCULAR | Status: DC | PRN
  Administered 2016-10-30: 0.1 mg via INTRAVENOUS

## 2016-10-30 MED ORDER — SCOPOLAMINE 1 MG/3DAYS TD PT72
MEDICATED_PATCH | TRANSDERMAL | Status: AC
  Administered 2016-10-30: 1.5 mg via TRANSDERMAL
  Filled 2016-10-30: qty 1

## 2016-10-30 MED ORDER — LIDOCAINE-EPINEPHRINE 1 %-1:100000 IJ SOLN
INTRAMUSCULAR | Status: AC
  Filled 2016-10-30: qty 1

## 2016-10-30 MED ORDER — GLYCOPYRROLATE 0.2 MG/ML IJ SOLN
INTRAMUSCULAR | Status: AC
  Filled 2016-10-30: qty 1

## 2016-10-30 MED ORDER — MIDAZOLAM HCL 2 MG/2ML IJ SOLN
INTRAMUSCULAR | Status: DC | PRN
  Administered 2016-10-30: 1 mg via INTRAVENOUS

## 2016-10-30 MED ORDER — ROCURONIUM BROMIDE 100 MG/10ML IV SOLN
INTRAVENOUS | Status: DC | PRN
  Administered 2016-10-30: 60 mg via INTRAVENOUS

## 2016-10-30 SURGICAL SUPPLY — 52 items
APPLICATOR ARISTA FLEXITIP XL (MISCELLANEOUS) ×3 IMPLANT
CABLE HIGH FREQUENCY MONO STRZ (ELECTRODE) IMPLANT
CLOTH BEACON ORANGE TIMEOUT ST (SAFETY) ×3 IMPLANT
CONT PATH 16OZ SNAP LID 3702 (MISCELLANEOUS) ×3 IMPLANT
COVER BACK TABLE 60X90IN (DRAPES) ×3 IMPLANT
COVER MAYO STAND STRL (DRAPES) ×3 IMPLANT
DECANTER SPIKE VIAL GLASS SM (MISCELLANEOUS) ×6 IMPLANT
DERMABOND ADVANCED (GAUZE/BANDAGES/DRESSINGS) ×1
DERMABOND ADVANCED .7 DNX12 (GAUZE/BANDAGES/DRESSINGS) ×2 IMPLANT
DRSG OPSITE POSTOP 3X4 (GAUZE/BANDAGES/DRESSINGS) ×3 IMPLANT
DURAPREP 26ML APPLICATOR (WOUND CARE) ×3 IMPLANT
ELECT REM PT RETURN 9FT ADLT (ELECTROSURGICAL) ×3
ELECTRODE REM PT RTRN 9FT ADLT (ELECTROSURGICAL) ×2 IMPLANT
FILTER SMOKE EVAC LAPAROSHD (FILTER) ×3 IMPLANT
GLOVE BIO SURGEON STRL SZ7.5 (GLOVE) ×9 IMPLANT
GLOVE BIOGEL PI IND STRL 6.5 (GLOVE) ×2 IMPLANT
GLOVE BIOGEL PI IND STRL 7.0 (GLOVE) ×4 IMPLANT
GLOVE BIOGEL PI IND STRL 8 (GLOVE) ×4 IMPLANT
GLOVE BIOGEL PI INDICATOR 6.5 (GLOVE) ×1
GLOVE BIOGEL PI INDICATOR 7.0 (GLOVE) ×2
GLOVE BIOGEL PI INDICATOR 8 (GLOVE) ×2
GLOVE ECLIPSE 7.5 STRL STRAW (GLOVE) ×6 IMPLANT
HEMOSTAT ARISTA ABSORB 3G PWDR (MISCELLANEOUS) ×3 IMPLANT
LEGGING LITHOTOMY PAIR STRL (DRAPES) ×3 IMPLANT
NEEDLE MAYO CATGUT SZ4 (NEEDLE) IMPLANT
NS IRRIG 1000ML POUR BTL (IV SOLUTION) ×3 IMPLANT
PACK LAVH (CUSTOM PROCEDURE TRAY) ×3 IMPLANT
PACK ROBOTIC GOWN (GOWN DISPOSABLE) ×3 IMPLANT
PACK TRENDGUARD 450 HYBRID PRO (MISCELLANEOUS) IMPLANT
PACK TRENDGUARD 600 HYBRD PROC (MISCELLANEOUS) ×2 IMPLANT
PAD OB MATERNITY 4.3X12.25 (PERSONAL CARE ITEMS) ×3 IMPLANT
POUCH LAPAROSCOPIC INSTRUMENT (MISCELLANEOUS) ×6 IMPLANT
PROTECTOR NERVE ULNAR (MISCELLANEOUS) ×6 IMPLANT
SCISSORS LAP 5X35 DISP (ENDOMECHANICALS) IMPLANT
SET CYSTO W/LG BORE CLAMP LF (SET/KITS/TRAYS/PACK) ×3 IMPLANT
SET IRRIG TUBING LAPAROSCOPIC (IRRIGATION / IRRIGATOR) ×3 IMPLANT
SHEARS HARMONIC ACE PLUS 36CM (ENDOMECHANICALS) ×3 IMPLANT
SLEEVE XCEL OPT CAN 5 100 (ENDOMECHANICALS) ×3 IMPLANT
SUT PLAIN 4 0 FS 2 27 (SUTURE) ×3 IMPLANT
SUT VIC AB 0 CT1 18XCR BRD8 (SUTURE) ×4 IMPLANT
SUT VIC AB 0 CT1 36 (SUTURE) ×3 IMPLANT
SUT VIC AB 0 CT1 8-18 (SUTURE) ×2
SUT VICRYL 0 TIES 12 18 (SUTURE) ×3 IMPLANT
SUT VICRYL 0 UR6 27IN ABS (SUTURE) ×3 IMPLANT
SYR BULB IRRIGATION 50ML (SYRINGE) ×3 IMPLANT
TOWEL OR 17X24 6PK STRL BLUE (TOWEL DISPOSABLE) ×6 IMPLANT
TRAY FOLEY CATH SILVER 14FR (SET/KITS/TRAYS/PACK) ×3 IMPLANT
TRENDGUARD 450 HYBRID PRO PACK (MISCELLANEOUS)
TRENDGUARD 600 HYBRID PROC PK (MISCELLANEOUS) ×3
TROCAR XCEL NON-BLD 11X100MML (ENDOMECHANICALS) ×3 IMPLANT
TROCAR XCEL NON-BLD 5MMX100MML (ENDOMECHANICALS) ×3 IMPLANT
WARMER LAPAROSCOPE (MISCELLANEOUS) ×3 IMPLANT

## 2016-10-30 NOTE — Op Note (Signed)
Tracy Valenzuela 1976/04/20 [id number]   Post Operative Note   Date of surgery:  10/30/2016  Pre Op Dx:  Menorrhagia, leiomyoma, iron deficiency anemia, LGSIL  Post Op Dx:  Same with additional pelvic adhesions  Procedure:  Laparoscopic-assisted vaginal hysterectomy, bilateral salpingectomies, lysis of adhesions, cystoscopy  Surgeon:  Anastasio Auerbach  Assistant:  Uvaldo Rising  Anesthesia:  General  EBL:  250 cc anesthesia reported  Complications:  None  Specimen:  Uterus and bilateral fallopian tubes, clinical weight 201 g to pathology  Findings: EUA:  External BUS vagina normal. Cervix normal. Uterus normal size midline mobile. Adnexa without masses   Operative:  Anterior cul-de-sac normal. Posterior cul-de-sac with omentum adherent to both uterosacral ligaments. Uterus enlarged with multiple myomas. Right and left fallopian tubes status post tubal sterilization with clips. Ovaries grossly normal free and mobile. Omental adhesions noted to both adnexa. Upper abdominal exam liver smooth without abnormalities. Gallbladder not visualized. Appendix not visualized. No gross upper abdominal disease.   Cystoscopy: Bladder mucosa appears normal without evidence of injury. Bilateral ureteral jets noted.  Procedure:  The patient was taken to the operating room, placed in the low dorsal lithotomy position, underwent general anesthesia, received an abdominal, perineal/vaginal preparation by nursing personnel. The timeout was performed by the surgical team. An EUA was performed. A Hulka tenaculum was placed through the cervix and a Foley catheter was placed in sterile technique. The patient was draped in usual fashion. A vertical infraumbilical incision was made and the 10 mm direct entry trocar was placed under direct visualization without difficulty and the abdomen was subsequently insufflated. Right and left 5 mm ports were placed after transillumination for the vessels under direct  visualization without difficulty. Examination of the pelvic organs and upper abdominal exam was carried out with findings noted above. Using the harmonic scalpel the omental to adnexal adhesions bilaterally were lysed as was the omental to the uterosacral ligaments adhesions lysed. The omentum was then retracted to the upper abdomen and the left adnexa was exposed, the fallopian tube elevated, the ureter visualized away from the surgical site and the mesosalpinx was then transected using the harmonic scalpel to its insertion with the uterus taking care to avoid the infundibulopelvic vascular pedicle. The uterine ovarian pedicle was then transected as was the left round ligament with the Harmonic scalpel and the parametrial peritoneal reflection was transected to the lower uterine segment and the vesicouterine peritoneal reflection was transected to the midline. A similar procedure was carried out on the other side. Attention was then turned to the vaginal portion of the procedure and the patient was placed in the high dorsal lithotomy position and the Hulka tenaculum was removed. A weighted speculum was placed, the cervix grasped with a single tooth tenaculum and the cervical mucosa was circumferentially injected using 1% lidocaine with 1-100,000 epinephrine dilution. The cervical mucosa was then circumferentially incised and the paracervical planes were sharply developed without difficulty and the anterior cul-de-sac was sharply entered without difficulty. The posterior cul-de-sac was also entered sharply without difficulty and a long weighted speculum was placed. The right and left uterosacral ligaments were clamped cut and ligated using 0 Vicryl suture and tagged for future reference. The paracervical and parametrial tissues were clamped cut and ligated using 0 Vicryl suture to the level of the uterine vessels which were identified and clamped cut and ligated using 0 Vicryl suture. At this point due to the bulk  of the uterus the uterus was cored to achieve delivery through the  vagina and the remaining attachments were clamped cut and ligated using 0 Vicryl suture. The long weighted speculum was replaced with a shorter weighted speculum, the posterior vaginal cuff grasped with an Allis clamp and the intestines were packed from the cul-de-sac with a tagged tail sponge. The posterior vaginal mucosa was run from uterosacral ligament to uterosacral ligament using 0 Vicryl suture in a running and locking stitch. The tagged tail sponge was removed and the vagina was closed anterior to posterior using 0 Vicryl suture in interrupted figure-of-eight stitch. The vagina was irrigated showing adequate hemostasis. The Foley catheter was removed and cystoscopy was performed showing normal-appearing bladder mucosa with no evidence of trauma and bilateral ureteral jets. The Foley catheter was replaced, the patient placed in low dorsal lithotomy position, the surgical team regloved and regowned and the abdomen was reinsufflated. The abdomen was irrigated and several bleeding points addressed using bipolar cautery superficially and Arista hemostatic agent was placed within the cul-de-sac. All surgical sites were reinspected under a low pressure situation showing adequate hemostasis at which point the 5 mm ports were removed under direct visualization showing adequate hemostasis and the infra umbilical port was backed out showing adequate hemostasis no evidence of hernia formation. All skin incisions were injected using 0.25% Marcaine and the infra umbilical port was closed using 0 Vicryl suture in an interrupted subcutaneous fascial stitch and the skin reapproximated using 4-0 plain suture in a running subcuticular stitch. Dermabond skin adhesive was applied over the infraumbilical incision site and the 5 mm ports were closed using the Dermabond skin adhesive. The needle, sponge and instrument counts were verified correct and the specimen  was verified for pathology. The patient received intraoperative Toradol, was awakened without difficulty and taken to recovery room in good condition having tolerated procedure well.     Anastasio Auerbach MD, 1:46 PM 10/30/2016

## 2016-10-30 NOTE — Telephone Encounter (Signed)
At Dr. Dorette Grate request I called pathology and spoke with Almyra Free. "Notify pathology that the patient also had a history of LGSIL on recent Pap smear along with her history of leiomyoma".  I read her this and she added it.

## 2016-10-30 NOTE — H&P (Signed)
The patient was examined.  I reviewed the proposed surgery and consent form with the patient.  The dictated history and physical is current and accurate and all questions were answered. The patient is ready to proceed with surgery and has a realistic understanding and expectation for the outcome.   Anastasio Auerbach MD, 10:01 AM 10/30/2016

## 2016-10-30 NOTE — Transfer of Care (Signed)
Immediate Anesthesia Transfer of Care Note  Patient: Tracy Valenzuela  Procedure(s) Performed: Procedure(s): LAPAROSCOPIC ASSISTED VAGINAL HYSTERECTOMY WITH SALPINGECTOMY (Bilateral) CYSTOSCOPY  Patient Location: PACU  Anesthesia Type:General  Level of Consciousness: awake, alert  and patient cooperative  Airway & Oxygen Therapy: Patient Spontanous Breathing and Patient connected to face mask oxygen  Post-op Assessment: Report given to RN and Post -op Vital signs reviewed and stable  Post vital signs: Reviewed and stable  Last Vitals:  Vitals:   10/30/16 0824  BP: 116/62  Pulse: 84  Resp: 14  Temp: 36.8 C    Last Pain:  Vitals:   10/30/16 0824  TempSrc: Oral      Patients Stated Pain Goal: 2 (17/40/81 4481)  Complications: No apparent anesthesia complications

## 2016-10-30 NOTE — Anesthesia Procedure Notes (Signed)
Performed by: Hewitt Blade

## 2016-10-30 NOTE — Anesthesia Procedure Notes (Signed)
Procedure Name: Intubation Date/Time: 10/30/2016 10:50 AM Performed by: Georgeanne Nim Pre-anesthesia Checklist: Patient identified, Emergency Drugs available, Suction available, Patient being monitored and Timeout performed Patient Re-evaluated:Patient Re-evaluated prior to inductionOxygen Delivery Method: Circle system utilized Preoxygenation: Pre-oxygenation with 100% oxygen Intubation Type: IV induction Ventilation: Mask ventilation without difficulty and Oral airway inserted - appropriate to patient size Laryngoscope Size: Mac and 3 Grade View: Grade III Number of attempts: 1 Airway Equipment and Method: Stylet (sniffing postion with blankets under chest and head) Placement Confirmation: ETT inserted through vocal cords under direct vision,  positive ETCO2,  CO2 detector and breath sounds checked- equal and bilateral Secured at: 21 cm Tube secured with: Tape Dental Injury: Teeth and Oropharynx as per pre-operative assessment  Difficulty Due To: Difficulty was anticipated and Difficult Airway- due to large tongue

## 2016-10-30 NOTE — Progress Notes (Signed)
Patient ID: Tracy Valenzuela, female   DOB: 08-Aug-1975, 41 y.o.   MRN: [id number]  In to see patient, resting comfortably Afeb, VSS Abd soft, incisions dry, intact Results of surgery reviewed with patient Continue routine postop care with anticipated D/C tomorrow AM

## 2016-10-30 NOTE — Anesthesia Preprocedure Evaluation (Signed)
Anesthesia Evaluation  Patient identified by MRN, date of birth, ID band Patient awake    Reviewed: Allergy & Precautions, NPO status , Patient's Chart, lab work & pertinent test results  Airway Mallampati: III       Dental  (+) Teeth Intact, Poor Dentition   Pulmonary former smoker,    Pulmonary exam normal breath sounds clear to auscultation       Cardiovascular Normal cardiovascular exam Rhythm:Regular Rate:Normal     Neuro/Psych PSYCHIATRIC DISORDERS Anxiety Depression Bipolar Disorder    GI/Hepatic Neg liver ROS, GERD  Medicated and Controlled,  Endo/Other  Morbid obesity  Renal/GU negative Renal ROS     Musculoskeletal negative musculoskeletal ROS (+)   Abdominal (+) + obese,   Peds  Hematology  (+) Blood dyscrasia, anemia ,   Anesthesia Other Findings   Reproductive/Obstetrics                             Anesthesia Physical Anesthesia Plan  ASA: III  Anesthesia Plan: General   Post-op Pain Management:    Induction: Intravenous  PONV Risk Score and Plan: 4 or greater and Ondansetron, Dexamethasone, Propofol, Midazolam and Scopolamine patch - Pre-op  Airway Management Planned: Oral ETT  Additional Equipment:   Intra-op Plan:   Post-operative Plan: Extubation in OR  Informed Consent: I have reviewed the patients History and Physical, chart, labs and discussed the procedure including the risks, benefits and alternatives for the proposed anesthesia with the patient or authorized representative who has indicated his/her understanding and acceptance.   Dental advisory given  Plan Discussed with: CRNA and Surgeon  Anesthesia Plan Comments:         Anesthesia Quick Evaluation

## 2016-10-30 NOTE — Anesthesia Postprocedure Evaluation (Signed)
Anesthesia Post Note  Patient: Tracy Valenzuela  Procedure(s) Performed: Procedure(s) (LRB): LAPAROSCOPIC ASSISTED VAGINAL HYSTERECTOMY WITH SALPINGECTOMY (Bilateral) CYSTOSCOPY     Patient location during evaluation: [facility] Anesthesia Type: General Level of consciousness: awake and alert Pain management: pain level controlled Vital Signs Assessment: post-procedure vital signs reviewed and stable Respiratory status: spontaneous breathing and patient connected to nasal cannula oxygen Cardiovascular status: blood pressure returned to baseline Postop Assessment: no signs of nausea or vomiting and adequate PO intake Anesthetic complications: no    Last Vitals:  Vitals:   10/30/16 1500 10/30/16 1600  BP: (!) 111/53 122/61  Pulse: 62 69  Resp: 16 16  Temp: 36.4 C (!) 36.3 C    Last Pain:  Vitals:   10/30/16 1600  TempSrc: Oral   Pain Goal: Patients Stated Pain Goal: 2 (10/30/16 0824)               [facility]

## 2016-10-31 DIAGNOSIS — N920 Excessive and frequent menstruation with regular cycle: Secondary | ICD-10-CM | POA: Diagnosis not present

## 2016-10-31 LAB — CBC
HCT: 32.2 % — ABNORMAL LOW (ref 36.0–46.0)
Hemoglobin: 10.7 g/dL — ABNORMAL LOW (ref 12.0–15.0)
MCH: 27.6 pg (ref 26.0–34.0)
MCHC: 33.2 g/dL (ref 30.0–36.0)
MCV: 83 fL (ref 78.0–100.0)
Platelets: 295 10*3/uL (ref 150–400)
RBC: 3.88 MIL/uL (ref 3.87–5.11)
RDW: 15.3 % (ref 11.5–15.5)
WBC: 9.8 10*3/uL (ref 4.0–10.5)

## 2016-10-31 MED ORDER — OXYCODONE-ACETAMINOPHEN 5-325 MG PO TABS: 1.0000 | ORAL_TABLET | ORAL | 0 refills | Status: DC | PRN

## 2016-10-31 NOTE — Progress Notes (Signed)
Pt discharged to home with family member.  Condition stable.  Pt to car via wheelchair with Astrid Divine, NT.  No equipment for home ordered at discharge.

## 2016-10-31 NOTE — Progress Notes (Signed)
Patient ID: Tracy Valenzuela, female   DOB: [birth year], 41 y.o.   MRN: [id number] Jayleen Scaglione 1975-06-29 [id number]   1 Day Post-Op Procedure(s) (LRB): LAPAROSCOPIC ASSISTED VAGINAL HYSTERECTOMY WITH SALPINGECTOMY (Bilateral) CYSTOSCOPY  Subjective: Patient reports has no problems, no acute distress, pain severity reported mild, Yes.   taking PO, foley catheter out, Yes.   voiding, Yes.   ambulating, Yes.   passing flatus  Objective: Vital signs in last 24 hours: Temp:  [97.4 F (36.3 C)-98.5 F (36.9 C)] 98.5 F (36.9 C) (07/04 0800) Pulse Rate:  [62-97] 67 (07/04 0800) Resp:  [12-20] 20 (07/04 0800) BP: (104-139)/(53-70) 119/67 (07/04 0800) SpO2:  [95 %-100 %] 97 % (07/04 0800) Weight:  [270 lb (122.5 kg)] 270 lb (122.5 kg) (07/03 1500) Last BM Date: 10/29/16    EXAM General: awake, alert and no distress Resp: clear to auscultation bilaterally Cardio: regular rate and rhythm GI: soft, minimal tenderness, bowel sounds active, incisions dry intact Lower Extremities: Without swelling or tenderness Vaginal Bleeding: Reported scant   Lab Results:   Recent Labs  10/31/16 0542  WBC 9.8  HGB 10.7*  HCT 32.2*  PLT 295    Assessment: s/p Procedure(s): LAPAROSCOPIC ASSISTED VAGINAL HYSTERECTOMY WITH SALPINGECTOMY CYSTOSCOPY: progressing well, ready for discharge.  Eating, ambulating, voiding with good pain relief on PO meds  Plan: Discharge home today.  Precautions, instructions and follow up were discussed with the patient.  Prescriptions provided per AVS.  Patient to call the office to arrange a post-operative appointmant in 2 weeks.    Anastasio Auerbach MD, 9:01 AM 10/31/2016

## 2016-10-31 NOTE — Discharge Instructions (Signed)
°  Postoperative Instructions Hysterectomy ° °Dr.  and the nursing staff have discussed postoperative instructions with you.  If you have any questions please ask them before you leave the hospital, or call Dr ’s office at [phone].   ° °We would like to emphasize the following instructions: ° ° °  Call the office to make your follow-up appointment as recommended by Dr  (usually 2 weeks). ° °  You were given a prescription, or one was ordered for you at the pharmacy you designated.  Get that prescription filled and take the medication according to instructions. ° °  You may eat a regular diet, but slowly until you start having bowel movements. ° °  Drink plenty of water daily. ° °  Nothing in the vagina (intercourse, douching, objects of any kind) until released by Dr . ° °  No driving for two weeks.  Wait to be cleared by Dr  at your first post op check.  Car rides (short) are ok after several days at home, as long as you are not having significant pain, but no traveling out of town. ° °  You may shower, but no baths.  Walking up and down stairs is ok.  No heavy lifting, prolonged standing, repeated bending or any “working out” until your first post op check. ° °  Rest frequently, listen to your body and do not push yourself and overdo it. ° °  Call if: ° °o Your pain medication does not seem strong enough. °o Worsening pain or abdominal bloating °o Persistent nausea or vomiting °o Difficulty with urination or bowel movements. °o Temperature of 101 degrees or higher. °o Bleeding heavier then staining (clots or period type flow). °o Incisions become red, tender or begin to drain. °o You have any questions or concerns. °

## 2016-11-01 ENCOUNTER — Encounter (HOSPITAL_COMMUNITY): Payer: Self-pay | Admitting: Gynecology

## 2016-11-07 ENCOUNTER — Telehealth: Payer: Self-pay | Admitting: *Deleted

## 2016-11-07 MED ORDER — FLUCONAZOLE 150 MG PO TABS: 150.0000 mg | ORAL_TABLET | Freq: Once | ORAL | 0 refills | Status: AC

## 2016-11-07 NOTE — Telephone Encounter (Signed)
Pt post LAVH on 10/30/16 pt c/o vaginal itching/irritation x 2 days and clear discharge, pt said at the hospital nurse had a hard time removing foley catheter and it hurt when she removed it. Has post-op scheduled on 11/13/16 with Dr.Fernandez. No other complaints. Please advise

## 2016-11-07 NOTE — Telephone Encounter (Signed)
I would go ahead with Diflucan 150 mg 1 dose. We did treat with antibiotics and she may have a little bit of a yeast infection.

## 2016-11-07 NOTE — Telephone Encounter (Signed)
Pt informed with the below note, Rx sent. 

## 2016-11-09 ENCOUNTER — Telehealth: Payer: Self-pay | Admitting: *Deleted

## 2016-11-09 ENCOUNTER — Ambulatory Visit: Payer: 59 | Admitting: Gynecology

## 2016-11-09 NOTE — Telephone Encounter (Signed)
Pt called left message in triage voicemail c/o worsen vaginal discharge and foul odor, I called pt back within minutes to tell her to schedule OV, but pt did not answer I left on her voicemail to schedule appointment with provider.

## 2016-11-13 ENCOUNTER — Encounter: Payer: Self-pay | Admitting: Gynecology

## 2016-11-13 ENCOUNTER — Ambulatory Visit (INDEPENDENT_AMBULATORY_CARE_PROVIDER_SITE_OTHER): Payer: 59 | Admitting: Gynecology

## 2016-11-13 ENCOUNTER — Ambulatory Visit: Payer: 59 | Admitting: Gynecology

## 2016-11-13 VITALS — BP 118/74

## 2016-11-13 DIAGNOSIS — Z09 Encounter for follow-up examination after completed treatment for conditions other than malignant neoplasm: Secondary | ICD-10-CM

## 2016-11-13 DIAGNOSIS — N898 Other specified noninflammatory disorders of vagina: Secondary | ICD-10-CM

## 2016-11-13 DIAGNOSIS — D500 Iron deficiency anemia secondary to blood loss (chronic): Secondary | ICD-10-CM

## 2016-11-13 LAB — CBC WITH DIFFERENTIAL/PLATELET
Basophils Absolute: 40 cells/uL (ref 0–200)
Basophils Relative: 1 %
Eosinophils Absolute: 120 cells/uL (ref 15–500)
Eosinophils Relative: 3 %
HCT: 33.1 % — ABNORMAL LOW (ref 35.0–45.0)
Hemoglobin: 11 g/dL — ABNORMAL LOW (ref 11.7–15.5)
Lymphocytes Relative: 44 %
Lymphs Abs: 1760 cells/uL (ref 850–3900)
MCH: 27.4 pg (ref 27.0–33.0)
MCHC: 33.2 g/dL (ref 32.0–36.0)
MCV: 82.3 fL (ref 80.0–100.0)
MPV: 9 fL (ref 7.5–12.5)
Monocytes Absolute: 280 cells/uL (ref 200–950)
Monocytes Relative: 7 %
Neutro Abs: 1800 cells/uL (ref 1500–7800)
Neutrophils Relative %: 45 %
Platelets: 384 10*3/uL (ref 140–400)
RBC: 4.02 MIL/uL (ref 3.80–5.10)
RDW: 14.8 % (ref 11.0–15.0)
WBC: 4 10*3/uL (ref 3.8–10.8)

## 2016-11-13 LAB — WET PREP FOR TRICH, YEAST, CLUE
Trich, Wet Prep: NONE SEEN
Yeast Wet Prep HPF POC: NONE SEEN

## 2016-11-13 MED ORDER — METRONIDAZOLE 500 MG PO TABS: 500.0000 mg | ORAL_TABLET | Freq: Two times a day (BID) | ORAL | 0 refills | Status: DC

## 2016-11-13 NOTE — Progress Notes (Signed)
   Patient presented to the office for her 2 weeks postop visit. On 10/30/2016 patient underwent laparoscopic-assisted vaginal hysterectomy with bilateral salpingectomy and cystoscopy as a result of symptomatic leiomyomatous uteri. Patient is doing well the only complaint is of a vaginal odor. Patient has no GU or GI complaints and is no longer taking narcotics for pain relief. Findings from surgery as well as pathology report were shared with the patient as follows:   Findings:         " EUA:  External BUS vagina normal. Cervix normal. Uterus normal size midline mobile. Adnexa without masses                         Operative:  Anterior cul-de-sac normal. Posterior cul-de-sac with omentum adherent to both uterosacral ligaments. Uterus enlarged with multiple myomas. Right and left fallopian tubes status post tubal sterilization with clips. Ovaries grossly normal free and mobile. Omental adhesions noted to both adnexa. Upper abdominal exam liver smooth without abnormalities. Gallbladder not visualized. Appendix not visualized. No gross upper abdominal disease.                         Cystoscopy: Bladder mucosa appears normal without evidence of injury. Bilateral ureteral jets noted."  Pathology report: Diagnosis Uterus and bilateral fallopian tubes, cervix CERVIX: MILD SQUAMOUS DYSPLASIA (CIN-I). ENDOMETRIUM: BENIGN SECRETORY-TYPE ENDOMETRIUM. MYOMETRIUM: LEIOMYOMATA. SEROSA: UNREMARKABLE. BILATERAL FALLOPIAN TUBES: PARATUBAL CYST(S).  Preoperative hemoglobin 11.5 postop hemoglobin before discharge from the hospital 10.7  Exam: Abdomen: Soft nontender no rebound or guarding incision ports intact Pelvic: Bartholin urethra Skene was within normal limits Vagina: No lesions or discharge Vaginal cuff intact, vaginal odor was evident Bimanual exam no palpable masses or tenderness Rectal exam not done  Wet prep many clue cells and many white blood cells were seen microscopically along with 2  numerous to count bacteria  Assessment/plan: Patient 2 weeks status post laparoscopic-assisted vaginal hysterectomy with bilateral salpingectomy as a result of symptomatic leiomyomatous uteri doing well with the exception of bacterial vaginosis which will be treated with Flagyl 500 mg one by mouth twice a day for 7 days. Because of her chronic anemia with going to be checking her CBC now 2 weeks from surgery. Patient to return back in 4 weeks for final postop visit. Once again it was recommended that she refrain from intercourse or any strenuous activity or lifting or water submersion until final postop visit.

## 2016-11-13 NOTE — Patient Instructions (Signed)
Metronidazole tablets or capsules What is this medicine? METRONIDAZOLE (me troe NI da zole) is an antiinfective. It is used to treat certain kinds of bacterial and protozoal infections. It will not work for colds, flu, or other viral infections. This medicine may be used for other purposes; ask your health care provider or pharmacist if you have questions. COMMON BRAND NAME(S): Flagyl What should I tell my health care provider before I take this medicine? They need to know if you have any of these conditions: -anemia or other blood disorders -disease of the nervous system -fungal or yeast infection -if you drink alcohol containing drinks -liver disease -seizures -an unusual or allergic reaction to metronidazole, or other medicines, foods, dyes, or preservatives -pregnant or trying to get pregnant -breast-feeding How should I use this medicine? Take this medicine by mouth with a full glass of water. Follow the directions on the prescription label. Take your medicine at regular intervals. Do not take your medicine more often than directed. Take all of your medicine as directed even if you think you are better. Do not skip doses or stop your medicine early. Talk to your pediatrician regarding the use of this medicine in children. Special care may be needed. Overdosage: If you think you have taken too much of this medicine contact a poison control center or emergency room at once. NOTE: This medicine is only for you. Do not share this medicine with others. What if I miss a dose? If you miss a dose, take it as soon as you can. If it is almost time for your next dose, take only that dose. Do not take double or extra doses. What may interact with this medicine? Do not take this medicine with any of the following medications: -alcohol or any product that contains alcohol -amprenavir oral solution -cisapride -disulfiram -dofetilide -dronedarone -paclitaxel injection -pimozide -ritonavir oral  solution -sertraline oral solution -sulfamethoxazole-trimethoprim injection -thioridazine -ziprasidone This medicine may also interact with the following medications: -birth control pills -cimetidine -lithium -other medicines that prolong the QT interval (cause an abnormal heart rhythm) -phenobarbital -phenytoin -warfarin This list may not describe all possible interactions. Give your health care provider a list of all the medicines, herbs, non-prescription drugs, or dietary supplements you use. Also tell them if you smoke, drink alcohol, or use illegal drugs. Some items may interact with your medicine. What should I watch for while using this medicine? Tell your doctor or health care professional if your symptoms do not improve or if they get worse. You may get drowsy or dizzy. Do not drive, use machinery, or do anything that needs mental alertness until you know how this medicine affects you. Do not stand or sit up quickly, especially if you are an older patient. This reduces the risk of dizzy or fainting spells. Avoid alcoholic drinks while you are taking this medicine and for three days afterward. Alcohol may make you feel dizzy, sick, or flushed. If you are being treated for a sexually transmitted disease, avoid sexual contact until you have finished your treatment. Your sexual partner may also need treatment. What side effects may I notice from receiving this medicine? Side effects that you should report to your doctor or health care professional as soon as possible: -allergic reactions like skin rash or hives, swelling of the face, lips, or tongue -confusion, clumsiness -difficulty speaking -discolored or sore mouth -dizziness -fever, infection -numbness, tingling, pain or weakness in the hands or feet -trouble passing urine or change in the amount of   urine -redness, blistering, peeling or loosening of the skin, including inside the mouth -seizures -unusually weak or  tired -vaginal irritation, dryness, or discharge Side effects that usually do not require medical attention (report to your doctor or health care professional if they continue or are bothersome): -diarrhea -headache -irritability -metallic taste -nausea -stomach pain or cramps -trouble sleeping This list may not describe all possible side effects. Call your doctor for medical advice about side effects. You may report side effects to FDA at 1-800-FDA-1088. Where should I keep my medicine? Keep out of the reach of children. Store at room temperature below 25 degrees C (77 degrees F). Protect from light. Keep container tightly closed. Throw away any unused medicine after the expiration date. NOTE: This sheet is a summary. It may not cover all possible information. If you have questions about this medicine, talk to your doctor, pharmacist, or health care provider.  2018 Elsevier/Gold Standard (2012-11-21 14:08:39) Bacterial Vaginosis Bacterial vaginosis is a vaginal infection that occurs when the normal balance of bacteria in the vagina is disrupted. It results from an overgrowth of certain bacteria. This is the most common vaginal infection among women ages 15-44. Because bacterial vaginosis increases your risk for STIs (sexually transmitted infections), getting treated can help reduce your risk for chlamydia, gonorrhea, herpes, and HIV (human immunodeficiency virus). Treatment is also important for preventing complications in pregnant women, because this condition can cause an early (premature) delivery. What are the causes? This condition is caused by an increase in harmful bacteria that are normally present in small amounts in the vagina. However, the reason that the condition develops is not fully understood. What increases the risk? The following factors may make you more likely to develop this condition:  Having a new sexual partner or multiple sexual partners.  Having unprotected  sex.  Douching.  Having an intrauterine device (IUD).  Smoking.  Drug and alcohol abuse.  Taking certain antibiotic medicines.  Being pregnant.  You cannot get bacterial vaginosis from toilet seats, bedding, swimming pools, or contact with objects around you. What are the signs or symptoms? Symptoms of this condition include:  Grey or white vaginal discharge. The discharge can also be watery or foamy.  A fish-like odor with discharge, especially after sexual intercourse or during menstruation.  Itching in and around the vagina.  Burning or pain with urination.  Some women with bacterial vaginosis have no signs or symptoms. How is this diagnosed? This condition is diagnosed based on:  Your medical history.  A physical exam of the vagina.  Testing a sample of vaginal fluid under a microscope to look for a large amount of bad bacteria or abnormal cells. Your health care provider may use a cotton swab or a small wooden spatula to collect the sample.  How is this treated? This condition is treated with antibiotics. These may be given as a pill, a vaginal cream, or a medicine that is put into the vagina (suppository). If the condition comes back after treatment, a second round of antibiotics may be needed. Follow these instructions at home: Medicines  Take over-the-counter and prescription medicines only as told by your health care provider.  Take or use your antibiotic as told by your health care provider. Do not stop taking or using the antibiotic even if you start to feel better. General instructions  If you have a female sexual partner, tell her that you have a vaginal infection. She should see her health care provider and be treated if   she has symptoms. If you have a female sexual partner, he does not need treatment.  During treatment: ? Avoid sexual activity until you finish treatment. ? Do not douche. ? Avoid alcohol as directed by your health care provider. ? Avoid  breastfeeding as directed by your health care provider.  Drink enough water and fluids to keep your urine clear or pale yellow.  Keep the area around your vagina and rectum clean. ? Wash the area daily with warm water. ? Wipe yourself from front to back after using the toilet.  Keep all follow-up visits as told by your health care provider. This is important. How is this prevented?  Do not douche.  Wash the outside of your vagina with warm water only.  Use protection when having sex. This includes latex condoms and dental dams.  Limit how many sexual partners you have. To help prevent bacterial vaginosis, it is best to have sex with just one partner (monogamous).  Make sure you and your sexual partner are tested for STIs.  Wear cotton or cotton-lined underwear.  Avoid wearing tight pants and pantyhose, especially during summer.  Limit the amount of alcohol that you drink.  Do not use any products that contain nicotine or tobacco, such as cigarettes and e-cigarettes. If you need help quitting, ask your health care provider.  Do not use illegal drugs. Where to find more information:  Centers for Disease Control and Prevention: www.cdc.gov/std  American Sexual Health Association (ASHA): www.ashastd.org  [place] Department of Health and Human Services, Office on Women's Health: www.womenshealth.gov/ or https://www.womenshealth.gov/a-z-topics/bacterial-vaginosis Contact a health care provider if:  Your symptoms do not improve, even after treatment.  You have more discharge or pain when urinating.  You have a fever.  You have pain in your abdomen.  You have pain during sex.  You have vaginal bleeding between periods. Summary  Bacterial vaginosis is a vaginal infection that occurs when the normal balance of bacteria in the vagina is disrupted.  Because bacterial vaginosis increases your risk for STIs (sexually transmitted infections), getting treated can help reduce your  risk for chlamydia, gonorrhea, herpes, and HIV (human immunodeficiency virus). Treatment is also important for preventing complications in pregnant women, because the condition can cause an early (premature) delivery.  This condition is treated with antibiotic medicines. These may be given as a pill, a vaginal cream, or a medicine that is put into the vagina (suppository). This information is not intended to replace advice given to you by your health care provider. Make sure you discuss any questions you have with your health care provider. Document Released: 04/16/2005 Document Revised: 12/31/2015 Document Reviewed: 12/31/2015 Elsevier Interactive Patient Education  2017 Elsevier Inc.  

## 2016-12-05 ENCOUNTER — Telehealth: Payer: Self-pay | Admitting: Family

## 2016-12-05 DIAGNOSIS — F419 Anxiety disorder, unspecified: Secondary | ICD-10-CM

## 2016-12-05 DIAGNOSIS — F329 Major depressive disorder, single episode, unspecified: Secondary | ICD-10-CM

## 2016-12-05 DIAGNOSIS — F32A Depression, unspecified: Secondary | ICD-10-CM

## 2016-12-05 MED ORDER — CLONAZEPAM 0.5 MG PO TABS: 0.5000 mg | ORAL_TABLET | Freq: Two times a day (BID) | ORAL | 1 refills | Status: DC | PRN

## 2016-12-05 NOTE — Telephone Encounter (Signed)
Medication printed for faxing.

## 2016-12-05 NOTE — Telephone Encounter (Signed)
Pt called for refill of her clonazePAM (KLONOPIN) 0.5 MG  Last Med Fu 09/2015 Appt made for Med fu for 12/13/2016 Please advise

## 2016-12-12 ENCOUNTER — Ambulatory Visit (INDEPENDENT_AMBULATORY_CARE_PROVIDER_SITE_OTHER): Payer: 59 | Admitting: Gynecology

## 2016-12-12 ENCOUNTER — Encounter: Payer: Self-pay | Admitting: Gynecology

## 2016-12-12 VITALS — BP 124/74

## 2016-12-12 DIAGNOSIS — Z9889 Other specified postprocedural states: Secondary | ICD-10-CM

## 2016-12-12 NOTE — Progress Notes (Signed)
    Tracy Valenzuela March 31, 1976 [id number]        41 y.o.  [id number] presents for her postoperative visit status post LAVH bilateral salpingectomies 10/30/2016. Patient has done well since then. Final pathology showed leiomyoma as well as CIN-1 of the cervix. She was noted to have LGSIL on preoperative evaluation.   Past medical history,surgical history, problem list, medications, allergies, family history and social history were all reviewed and documented in the EPIC chart.  Directed ROS with pertinent positives and negatives documented in the history of present illness/assessment and plan.  Exam: [name]  assistant Vitals:   12/12/16 1440  BP: 124/74   General appearance:  Normal Abdomen soft nontender without masses guarding rebound. Incisions healed nicely. Pelvic external BUS vagina with cuff healed nicely. Bimanual without masses or tenderness.   Assessment/Plan:  41 y.o. [id number] with normal postoperative visit status post LAVH bilateral salpingectomies. Patient will slowly resume all normal activities with the exception of continue pelvic rest for another 2 weeks. She'll return in December for her annual exam. Sooner if any issues.   Anastasio Auerbach MD, 3:01 PM 12/12/2016

## 2016-12-12 NOTE — Patient Instructions (Signed)
Resume normal activities with the exception of no intercourse for another 2 weeks. Follow up in December 2018 for annual exam.

## 2016-12-13 ENCOUNTER — Encounter: Payer: Self-pay | Admitting: Family

## 2016-12-13 ENCOUNTER — Ambulatory Visit (INDEPENDENT_AMBULATORY_CARE_PROVIDER_SITE_OTHER): Payer: 59 | Admitting: Family

## 2016-12-13 VITALS — BP 114/72 | HR 83 | Temp 98.0°F | Resp 16 | Ht 66.0 in | Wt 268.0 lb

## 2016-12-13 DIAGNOSIS — F3162 Bipolar disorder, current episode mixed, moderate: Secondary | ICD-10-CM | POA: Diagnosis not present

## 2016-12-13 MED ORDER — QUETIAPINE FUMARATE 100 MG PO TABS: 100.0000 mg | ORAL_TABLET | Freq: Every day | ORAL | 0 refills | Status: DC

## 2016-12-13 NOTE — Patient Instructions (Addendum)
Thank you for choosing [facility].  SUMMARY AND INSTRUCTIONS:  Please continue to take your medications as prescribed.  Please check on the price of Saxenda, Belviq, Qsymia, or Contrave  Recommended caloric intake of 1200-1500 cal per day focused on protein and lower carbohydrates and processed/sugary foods.  Mediterranean-style intake is recommended with suggestions below.   Continue physical activity with goal of 30 minutes of moderate level activity daily  Medication:  Your prescription(s) have been submitted to your pharmacy or been printed and provided for you. Please take as directed and contact our office if you believe you are having problem(s) with the medication(s) or have any questions.    Follow up:  If your symptoms worsen or fail to improve, please contact our office for further instruction, or in case of emrgency go directly to the emergency room at the closest medical facility. e       Why follow it? Research shows. . Those who follow the Mediterranean diet have a reduced risk of heart disease  . The diet is associated with a reduced incidence of Parkinson's and Alzheimer's diseases . People following the diet may have longer life expectancies and lower rates of chronic diseases  . The Dietary Guidelines for Americans recommends the Mediterranean diet as an eating plan to promote health and prevent disease  What Is the Mediterranean Diet?  . Healthy eating plan based on typical foods and recipes of Mediterranean-style cooking . The diet is primarily a plant based diet; these foods should make up a majority of meals   Starches - Plant based foods should make up a majority of meals - They are an important sources of vitamins, minerals, energy, antioxidants, and fiber - Choose whole grains, foods high in fiber and minimally processed items  - Typical grain sources include wheat, oats, barley, corn, brown rice, bulgar, farro, millet, polenta, couscous  -  Various types of beans include chickpeas, lentils, fava beans, black beans, white beans   Fruits  Veggies - Large quantities of antioxidant rich fruits & veggies; 6 or more servings  - Vegetables can be eaten raw or lightly drizzled with oil and cooked  - Vegetables common to the traditional Mediterranean Diet include: artichokes, arugula, beets, broccoli, brussel sprouts, cabbage, carrots, celery, collard greens, cucumbers, eggplant, kale, leeks, lemons, lettuce, mushrooms, okra, onions, peas, peppers, potatoes, pumpkin, radishes, rutabaga, shallots, spinach, sweet potatoes, turnips, zucchini - Fruits common to the Mediterranean Diet include: apples, apricots, avocados, cherries, clementines, dates, figs, grapefruits, grapes, melons, nectarines, oranges, peaches, pears, pomegranates, strawberries, tangerines  Fats - Replace butter and margarine with healthy oils, such as olive oil, canola oil, and tahini  - Limit nuts to no more than a handful a day  - Nuts include walnuts, almonds, pecans, pistachios, pine nuts  - Limit or avoid candied, honey roasted or heavily salted nuts - Olives are central to the [employer] - can be eaten whole or used in a variety of dishes   Meats Protein - Limiting red meat: no more than a few times a month - When eating red meat: choose lean cuts and keep the portion to the size of deck of cards - Eggs: approx. 0 to 4 times a week  - Fish and lean poultry: at least 2 a week  - Healthy protein sources include, chicken, [place], lean beef, lamb - Increase intake of seafood such as tuna, salmon, trout, mackerel, shrimp, scallops - Avoid or limit high fat processed meats such as sausage and bacon  Dairy - Include moderate amounts of low fat dairy products  - Focus on healthy dairy such as fat free yogurt, skim milk, low or reduced fat cheese - Limit dairy products higher in fat such as whole or 2% milk, cheese, ice cream  Alcohol - Moderate amounts of red wine is ok   - No more than 5 oz daily for women (all ages) and men older than age 28  - No more than 10 oz of wine daily for men younger than 10  Other - Limit sweets and other desserts  - Use herbs and spices instead of salt to flavor foods  - Herbs and spices common to the traditional Mediterranean Diet include: basil, bay leaves, chives, cloves, cumin, fennel, garlic, lavender, marjoram, mint, oregano, parsley, pepper, rosemary, sage, savory, sumac, tarragon, thyme   It's not just a diet, it's a lifestyle:  . The Mediterranean diet includes lifestyle factors typical of those in the region  . Foods, drinks and meals are best eaten with others and savored . Daily physical activity is important for overall good health . This could be strenuous exercise like running and aerobics . This could also be more leisurely activities such as walking, housework, yard-work, or taking the stairs . Moderation is the key; a balanced and healthy diet accommodates most foods and drinks . Consider portion sizes and frequency of consumption of certain foods   Meal Ideas & Options:  . Breakfast:  o Whole wheat toast or whole wheat English muffins with peanut butter & hard boiled egg o Steel cut oats topped with apples & cinnamon and skim milk  o Fresh fruit: banana, strawberries, melon, berries, peaches  o Smoothies: strawberries, bananas, greek yogurt, peanut butter o Low fat greek yogurt with blueberries and granola  o Egg white omelet with spinach and mushrooms o Breakfast couscous: whole wheat couscous, apricots, skim milk, cranberries  . Sandwiches:  o Hummus and grilled vegetables (peppers, zucchini, squash) on whole wheat bread   o Grilled chicken on whole wheat pita with lettuce, tomatoes, cucumbers or tzatziki  o Tuna salad on whole wheat bread: tuna salad made with greek yogurt, olives, red peppers, capers, green onions o Garlic rosemary lamb pita: lamb sauted with garlic, rosemary, salt & pepper; add  lettuce, cucumber, greek yogurt to pita - flavor with lemon juice and black pepper  . Seafood:  o Mediterranean grilled salmon, seasoned with garlic, basil, parsley, lemon juice and black pepper o Shrimp, lemon, and spinach whole-grain pasta salad made with low fat greek yogurt  o Seared scallops with lemon orzo  o Seared tuna steaks seasoned salt, pepper, coriander topped with tomato mixture of olives, tomatoes, olive oil, minced garlic, parsley, green onions and cappers  . Meats:  o Herbed greek chicken salad with kalamata olives, cucumber, feta  o Red bell peppers stuffed with spinach, bulgur, lean ground beef (or lentils) & topped with feta   o Kebabs: skewers of chicken, tomatoes, onions, zucchini, squash  o [place] burgers: made with red onions, mint, dill, lemon juice, feta cheese topped with roasted red peppers . Vegetarian o Cucumber salad: cucumbers, artichoke hearts, celery, red onion, feta cheese, tossed in olive oil & lemon juice  o Hummus and whole grain pita points with a greek salad (lettuce, tomato, feta, olives, cucumbers, red onion) o Lentil soup with celery, carrots made with vegetable broth, garlic, salt and pepper  o Tabouli salad: parsley, bulgur, mint, scallions, cucumbers, tomato, radishes, lemon juice, olive oil, salt and  pepper.

## 2016-12-13 NOTE — Progress Notes (Signed)
Subjective:    Patient ID: Tracy Valenzuela, female    DOB: [birth year], 41 y.o.   MRN: [id number]  Chief Complaint  Patient presents with  . Medication Refill    klonopin and seroquel    HPI:  Tracy Valenzuela is a 41 y.o. female who  has a past medical history of Anemia; Anxiety; ASCUS with positive high risk HPV (11/2014); Asthma; Bipolar 1 disorder ([place]); GERD (gastroesophageal reflux disease); Heart murmur; LGSIL (low grade squamous intraepithelial dysplasia) (11/2014); Pre-diabetes; and Prediabetes. and presents today for a follow up office visit.  1.) Bipolar - Currently maintained on Seroquel and clonazepam. Reports taking the medication as prescribed and denies adverse side effects. Mood is stable with no exacerbations of mania or depression. No suicidal ideation. Sleeping on average only a few hours per night.   2.) Weight management - Continues to struggle with weight management secondary to steroid usage during her most recent surgeyr. States she has been working on nutrition and physical activity with about 10 pounds of weight loss to date. Has tried Weight Watchers in the past with some success but ended up gaining the weight back. Interested in trying weight loss medication.   Wt Readings from Last 3 Encounters:  12/13/16 268 lb (121.6 kg)  10/30/16 270 lb (122.5 kg)  10/22/16 270 lb 8 oz (122.7 kg)     Allergies  Allergen Reactions  . Penicillins Hives    Has patient had a PCN reaction causing immediate rash, facial/tongue/throat swelling, SOB or lightheadedness with hypotension:no Has patient had a PCN reaction causing severe rash involving mucus membranes or skin necrosis: Yes Has patient had a PCN reaction that required hospitalization No Has patient had a PCN reaction occurring within the last 10 years: No If all of the above answers are "NO", then may proceed with Cephalosporin use.       Outpatient Medications Prior to Visit  Medication Sig Dispense Refill  .  clonazePAM (KLONOPIN) 0.5 MG tablet Take 1 tablet (0.5 mg total) by mouth 2 (two) times daily as needed. for anxiety 30 tablet 1  . dicyclomine (BENTYL) 10 MG capsule Take 1-2 capsules (10-20 mg total) by mouth every 8 (eight) hours as needed for spasms. (Patient taking differently: Take 10 mg by mouth every 8 (eight) hours as needed for spasms. ) 30 capsule 1  . QUEtiapine (SEROQUEL) 100 MG tablet Take 1 tablet (100 mg total) by mouth at bedtime. 30 tablet 0   No facility-administered medications prior to visit.      Past Medical History:  Diagnosis Date  . Anemia   . Anxiety   . ASCUS with positive high risk HPV 11/2014  . Asthma   . Bipolar 1 disorder ([place])   . GERD (gastroesophageal reflux disease)   . Heart murmur   . LGSIL (low grade squamous intraepithelial dysplasia) 11/2014   colposcopy biopsy.  recommend follow up pap in one year  . Pre-diabetes   . Prediabetes       Review of Systems  Constitutional: Negative for chills and fever.  Respiratory: Negative for cough, chest tightness and shortness of breath.   Cardiovascular: Negative for chest pain, palpitations and leg swelling.  Psychiatric/Behavioral: Negative for agitation, behavioral problems, confusion, decreased concentration, dysphoric mood, hallucinations, self-injury, sleep disturbance and suicidal ideas. The patient is not nervous/anxious and is not hyperactive.       Objective:    BP 114/72 (BP Location: Left Arm, Patient Position: Sitting, Cuff Size: Large)   Pulse 83  Temp 98 F (36.7 C) (Oral)   Resp 16   Ht 5\' 6"  (1.676 m)   Wt 268 lb (121.6 kg)   LMP  (LMP Unknown) Comment: neg beta hcg 05-23-16  SpO2 97%   BMI 43.26 kg/m  Nursing note and vital signs reviewed.  Physical Exam  Constitutional: She is oriented to person, place, and time. She appears well-developed and well-nourished. No distress.  Cardiovascular: Normal rate, regular rhythm, normal heart sounds and intact distal pulses.  Exam  reveals no gallop and no friction rub.   No murmur heard. Pulmonary/Chest: Effort normal and breath sounds normal. No respiratory distress. She has no wheezes. She has no rales. She exhibits no tenderness.  Neurological: She is alert and oriented to person, place, and time.  Skin: Skin is warm and dry.  Psychiatric: She has a normal mood and affect. Her behavior is normal. Judgment and thought content normal.       Assessment & Plan:   Problem List Items Addressed This Visit      Other   Bipolar 1 disorder, mixed, moderate ([place]) - Primary    Bipolar/depression/anxiety appears adequately controlled current medication regimen and no adverse side effects. Does have some difficulty sleeping on occasion. Continue current dosage of Seroquel and clonazepam. [place] controlled substance database reviewed with no irregularities. No suicidal ideation. Continue to monitor.      Relevant Medications   QUEtiapine (SEROQUEL) 100 MG tablet   Morbid obesity (HCC)    BMI of 43.26 and likely exacerbated from recent steroid use related to surgery. Encouraged to continue with decrease caloric intake and increase physical activity. Research cost of medications to assist with weight loss. Goal is approximately 5-10% of current body weight through nutrition and physical activity changes. Consider Weight Watchers, Rickard Patience, or Nutrisystem as needed. Recommended high protein/fats and low carbohydrate intake. Continue to monitor and follow-up with medication choices.          I am having [name] maintain her dicyclomine, clonazePAM, and QUEtiapine.   Meds ordered this encounter  Medications  . QUEtiapine (SEROQUEL) 100 MG tablet    Sig: Take 1 tablet (100 mg total) by mouth at bedtime.    Dispense:  90 tablet    Refill:  0    Order Specific Question:   Supervising Provider    Answer:   Pricilla Holm A [[id number]]     Follow-up: Return in about 3 months (around 03/15/2017), or if  symptoms worsen or fail to improve.  Mauricio Po, FNP

## 2016-12-13 NOTE — Assessment & Plan Note (Signed)
BMI of 43.26 and likely exacerbated from recent steroid use related to surgery. Encouraged to continue with decrease caloric intake and increase physical activity. Research cost of medications to assist with weight loss. Goal is approximately 5-10% of current body weight through nutrition and physical activity changes. Consider Weight Watchers, Tracy Valenzuela, or Nutrisystem as needed. Recommended high protein/fats and low carbohydrate intake. Continue to monitor and follow-up with medication choices.

## 2016-12-13 NOTE — Assessment & Plan Note (Signed)
Bipolar/depression/anxiety appears adequately controlled current medication regimen and no adverse side effects. Does have some difficulty sleeping on occasion. Continue current dosage of Seroquel and clonazepam. [place] controlled substance database reviewed with no irregularities. No suicidal ideation. Continue to monitor.

## 2016-12-20 ENCOUNTER — Telehealth: Payer: Self-pay | Admitting: Family

## 2017-01-17 ENCOUNTER — Telehealth: Payer: Self-pay | Admitting: *Deleted

## 2017-01-17 NOTE — Telephone Encounter (Signed)
Left message for patient to call to schedule NP appointment with Elyn Aquas, PA-C

## 2017-01-17 NOTE — Telephone Encounter (Signed)
Ok with me. Thanks.  

## 2017-01-17 NOTE — Telephone Encounter (Signed)
Ok with me 

## 2017-01-17 NOTE — Telephone Encounter (Signed)
Patient calling wanting to transfer care from [facility] to [facility].

## 2017-02-18 ENCOUNTER — Ambulatory Visit: Payer: 59 | Admitting: Physician Assistant

## 2017-02-18 DIAGNOSIS — Z0289 Encounter for other administrative examinations: Secondary | ICD-10-CM

## 2017-03-08 ENCOUNTER — Ambulatory Visit (INDEPENDENT_AMBULATORY_CARE_PROVIDER_SITE_OTHER): Payer: 59 | Admitting: Physician Assistant

## 2017-03-08 ENCOUNTER — Encounter: Payer: Self-pay | Admitting: Physician Assistant

## 2017-03-08 ENCOUNTER — Other Ambulatory Visit: Payer: Self-pay

## 2017-03-08 VITALS — BP 100/58 | HR 63 | Temp 97.8°F | Resp 14 | Ht 66.0 in | Wt 266.0 lb

## 2017-03-08 DIAGNOSIS — J019 Acute sinusitis, unspecified: Secondary | ICD-10-CM | POA: Diagnosis not present

## 2017-03-08 DIAGNOSIS — B9689 Other specified bacterial agents as the cause of diseases classified elsewhere: Secondary | ICD-10-CM | POA: Diagnosis not present

## 2017-03-08 DIAGNOSIS — F3162 Bipolar disorder, current episode mixed, moderate: Secondary | ICD-10-CM

## 2017-03-08 MED ORDER — TOPIRAMATE 50 MG PO TABS: ORAL_TABLET | ORAL | 0 refills | Status: DC

## 2017-03-08 MED ORDER — DOXYCYCLINE HYCLATE 100 MG PO CAPS: 100.0000 mg | ORAL_CAPSULE | Freq: Two times a day (BID) | ORAL | 0 refills | Status: DC

## 2017-03-08 NOTE — Patient Instructions (Signed)
Please continue Seroquel as directed. Start the Topamax as directed. Follow-up with me in 3 weeks so we can adjust medication.  Please take antibiotic as directed.  Increase fluid intake.  Use Saline nasal spray.  Take a daily multivitamin.  Place a humidifier in the bedroom.  Please call or return clinic if symptoms are not improving.  Sinusitis Sinusitis is redness, soreness, and swelling (inflammation) of the paranasal sinuses. Paranasal sinuses are air pockets within the bones of your face (beneath the eyes, the middle of the forehead, or above the eyes). In healthy paranasal sinuses, mucus is able to drain out, and air is able to circulate through them by way of your nose. However, when your paranasal sinuses are inflamed, mucus and air can become trapped. This can allow bacteria and other germs to grow and cause infection. Sinusitis can develop quickly and last only a short time (acute) or continue over a long period (chronic). Sinusitis that lasts for more than 12 weeks is considered chronic.  CAUSES  Causes of sinusitis include:  Allergies.  Structural abnormalities, such as displacement of the cartilage that separates your nostrils (deviated septum), which can decrease the air flow through your nose and sinuses and affect sinus drainage.  Functional abnormalities, such as when the small hairs (cilia) that line your sinuses and help remove mucus do not work properly or are not present. SYMPTOMS  Symptoms of acute and chronic sinusitis are the same. The primary symptoms are pain and pressure around the affected sinuses. Other symptoms include:  Upper toothache.  Earache.  Headache.  Bad breath.  Decreased sense of smell and taste.  A cough, which worsens when you are lying flat.  Fatigue.  Fever.  Thick drainage from your nose, which often is green and may contain pus (purulent).  Swelling and warmth over the affected sinuses. DIAGNOSIS  Your caregiver will perform a  physical exam. During the exam, your caregiver may:  Look in your nose for signs of abnormal growths in your nostrils (nasal polyps).  Tap over the affected sinus to check for signs of infection.  View the inside of your sinuses (endoscopy) with a special imaging device with a light attached (endoscope), which is inserted into your sinuses. If your caregiver suspects that you have chronic sinusitis, one or more of the following tests may be recommended:  Allergy tests.  Nasal culture A sample of mucus is taken from your nose and sent to a lab and screened for bacteria.  Nasal cytology A sample of mucus is taken from your nose and examined by your caregiver to determine if your sinusitis is related to an allergy. TREATMENT  Most cases of acute sinusitis are related to a viral infection and will resolve on their own within 10 days. Sometimes medicines are prescribed to help relieve symptoms (pain medicine, decongestants, nasal steroid sprays, or saline sprays).  However, for sinusitis related to a bacterial infection, your caregiver will prescribe antibiotic medicines. These are medicines that will help kill the bacteria causing the infection.  Rarely, sinusitis is caused by a fungal infection. In theses cases, your caregiver will prescribe antifungal medicine. For some cases of chronic sinusitis, surgery is needed. Generally, these are cases in which sinusitis recurs more than 3 times per year, despite other treatments. HOME CARE INSTRUCTIONS   Drink plenty of water. Water helps thin the mucus so your sinuses can drain more easily.  Use a humidifier.  Inhale steam 3 to 4 times a day (for example, sit  in the bathroom with the shower running).  Apply a warm, moist washcloth to your face 3 to 4 times a day, or as directed by your caregiver.  Use saline nasal sprays to help moisten and clean your sinuses.  Take over-the-counter or prescription medicines for pain, discomfort, or fever only  as directed by your caregiver. SEEK IMMEDIATE MEDICAL CARE IF:  You have increasing pain or severe headaches.  You have nausea, vomiting, or drowsiness.  You have swelling around your face.  You have vision problems.  You have a stiff neck.  You have difficulty breathing. MAKE SURE YOU:   Understand these instructions.  Will watch your condition.  Will get help right away if you are not doing well or get worse. Document Released: 04/16/2005 Document Revised: 07/09/2011 Document Reviewed: 05/01/2011 [facility] Patient Information 2014 [place], [place].

## 2017-03-08 NOTE — Progress Notes (Signed)
Pre visit review using our clinic review tool, if applicable. No additional management support is needed unless otherwise documented below in the visit note. 

## 2017-03-08 NOTE — Progress Notes (Signed)
Patient presents to clinic today to transfer care from [facility] office as previous PCP has changed practices.  Endorses sinus pressure with maxillary sinus pain and tooth pain bilaterally. Denies fever, chest congestion but notes dry cough. Notes significant PND that has been thick and darker green. Denies recent travel or sick contact. Notes significant history of sinusitis.   Bipolar I Disorder -- managed by previous PCP. Has been on Seroquel 100 mg daily for quite some time. Has done very well with this medication except for weight gain associated with medication. She is working hard on diet and getting back into her exercise regimen. She has been treated previously with Topamax and would like to consider restarting this medication. States she tolerated well with Seroquel without side effects.     Past Medical History:  Diagnosis Date  . Anemia   . Anxiety   . ASCUS with positive high risk HPV 11/2014  . Asthma   . Bipolar 1 disorder ([place])   . GERD (gastroesophageal reflux disease)   . Heart murmur   . LGSIL (low grade squamous intraepithelial dysplasia) 11/2014   colposcopy biopsy.  recommend follow up pap in one year  . Pre-diabetes   . Prediabetes     Current Outpatient Medications on File Prior to Visit  Medication Sig Dispense Refill  . QUEtiapine (SEROQUEL) 100 MG tablet Take 1 tablet (100 mg total) by mouth at bedtime. 90 tablet 0   No current facility-administered medications on file prior to visit.     Allergies  Allergen Reactions  . Penicillins Hives    Has patient had a PCN reaction causing immediate rash, facial/tongue/throat swelling, SOB or lightheadedness with hypotension:no Has patient had a PCN reaction causing severe rash involving mucus membranes or skin necrosis: Yes Has patient had a PCN reaction that required hospitalization No Has patient had a PCN reaction occurring within the last 10 years: No If all of the above answers are "NO", then may proceed with  Cephalosporin use.     Family History  Problem Relation Age of Onset  . Lupus Mother   . Heart disease Mother   . Diabetes Maternal Grandmother   . Heart disease Maternal Grandmother   . Hypertension Maternal Grandmother   . Prostate cancer Maternal Grandfather   . Alzheimer's disease Paternal Grandfather   . Colon cancer Neg Hx   . Stomach cancer Neg Hx     Social History   Socioeconomic History  . Marital status: Legally Separated    Spouse name: None  . Number of children: 3  . Years of education: 33  . Highest education level: None  Social Needs  . Financial resource strain: None  . Food insecurity - worry: None  . Food insecurity - inability: None  . Transportation needs - medical: None  . Transportation needs - non-medical: None  Occupational History  . Occupation: Radio broadcast assistant  Tobacco Use  . Smoking status: Former Smoker    Types: Cigarettes  . Smokeless tobacco: Never Used  . Tobacco comment: occassional  Substance and Sexual Activity  . Alcohol use: Yes    Alcohol/week: 0.0 oz  . Drug use: No  . Sexual activity: Yes    Birth control/protection: Surgical    Comment: intercourse age 39, sexual partners more than 5  Other Topics Concern  . None  Social History Narrative   Fun: Sleep    Review of Systems - See HPI.  All other ROS are negative.  BP (!) 100/58  Pulse 63   Temp 97.8 F (36.6 C) (Oral)   Resp 14   Ht 5\' 6"  (1.676 m)   Wt 266 lb (120.7 kg)   LMP  (LMP Unknown) Comment: neg beta hcg 05-23-16  SpO2 99%   BMI 42.93 kg/m   Physical Exam  Constitutional: She is oriented to person, place, and time and well-developed, well-nourished, and in no distress.  HENT:  Head: Normocephalic and atraumatic.  Right Ear: Tympanic membrane normal.  Left Ear: Tympanic membrane normal.  Nose: Mucosal edema and rhinorrhea present. Right sinus exhibits maxillary sinus tenderness. Left sinus exhibits maxillary sinus tenderness.  Eyes: Conjunctivae  are normal.  Neck: Neck supple.  Cardiovascular: Normal rate, regular rhythm, normal heart sounds and intact distal pulses.  Pulmonary/Chest: Effort normal and breath sounds normal. No respiratory distress. She has no wheezes. She has no rales. She exhibits no tenderness.  Lymphadenopathy:    She has no cervical adenopathy.  Neurological: She is alert and oriented to person, place, and time. No cranial nerve deficit.  Skin: Skin is warm and dry. No rash noted.  Psychiatric: Affect normal.  Vitals reviewed.  Assessment/Plan: Acute bacterial sinusitis Rx doxycycline.  Increase fluids.  Rest.  Saline nasal spray.  Probiotic.  Mucinex as directed.  Humidifier in bedroom.  Call or return to clinic if symptoms are not improving.   Bipolar 1 disorder, mixed, moderate (HCC) Has done extremely well with current dose of Seroquel. Will continue this regimen. She is dealing with antipsychotic-associated weight gain. Has taken Topamax previously with good results. Will restart at 50 mg once daily x 1 week, then increase to 1 tablet BID. Close follow-up scheduled.  Morbid obesity ([place]) Reviewed dietary and exercise regimen. Will restart Topamax. Close follow-up scheduled.    Leeanne Rio, PA-C

## 2017-03-10 DIAGNOSIS — J019 Acute sinusitis, unspecified: Principal | ICD-10-CM

## 2017-03-10 DIAGNOSIS — B9689 Other specified bacterial agents as the cause of diseases classified elsewhere: Secondary | ICD-10-CM | POA: Insufficient documentation

## 2017-03-10 NOTE — Assessment & Plan Note (Signed)
Has done extremely well with current dose of Seroquel. Will continue this regimen. She is dealing with antipsychotic-associated weight gain. Has taken Topamax previously with good results. Will restart at 50 mg once daily x 1 week, then increase to 1 tablet BID. Close follow-up scheduled.

## 2017-03-10 NOTE — Assessment & Plan Note (Signed)
Rx doxycycline.  Increase fluids.  Rest.  Saline nasal spray.  Probiotic.  Mucinex as directed.  Humidifier in bedroom.  Call or return to clinic if symptoms are not improving.

## 2017-03-10 NOTE — Assessment & Plan Note (Signed)
Reviewed dietary and exercise regimen. Will restart Topamax. Close follow-up scheduled.

## 2017-03-29 ENCOUNTER — Ambulatory Visit: Payer: 59 | Admitting: Physician Assistant

## 2017-04-04 ENCOUNTER — Encounter: Payer: 59 | Admitting: Gynecology

## 2017-04-04 DIAGNOSIS — Z0289 Encounter for other administrative examinations: Secondary | ICD-10-CM

## 2017-04-27 ENCOUNTER — Other Ambulatory Visit: Payer: Self-pay

## 2017-04-27 ENCOUNTER — Encounter (HOSPITAL_COMMUNITY): Payer: Self-pay

## 2017-04-27 ENCOUNTER — Emergency Department (HOSPITAL_COMMUNITY): Payer: 59

## 2017-04-27 ENCOUNTER — Observation Stay (HOSPITAL_COMMUNITY)
Admission: EM | Admit: 2017-04-27 | Discharge: 2017-04-30 | DRG: 313 | Disposition: A | Discharge status: Home / Self Care | Payer: 59 | Attending: Internal Medicine | Admitting: Internal Medicine

## 2017-04-27 DIAGNOSIS — Z713 Dietary counseling and surveillance: Secondary | ICD-10-CM

## 2017-04-27 DIAGNOSIS — R079 Chest pain, unspecified: Secondary | ICD-10-CM | POA: Diagnosis not present

## 2017-04-27 DIAGNOSIS — F3162 Bipolar disorder, current episode mixed, moderate: Secondary | ICD-10-CM | POA: Diagnosis present

## 2017-04-27 DIAGNOSIS — F17210 Nicotine dependence, cigarettes, uncomplicated: Secondary | ICD-10-CM | POA: Diagnosis present

## 2017-04-27 DIAGNOSIS — R7303 Prediabetes: Secondary | ICD-10-CM

## 2017-04-27 DIAGNOSIS — Z880 Allergy status to penicillin: Secondary | ICD-10-CM | POA: Diagnosis not present

## 2017-04-27 DIAGNOSIS — Z79899 Other long term (current) drug therapy: Secondary | ICD-10-CM | POA: Diagnosis not present

## 2017-04-27 DIAGNOSIS — R0789 Other chest pain: Principal | ICD-10-CM

## 2017-04-27 DIAGNOSIS — F419 Anxiety disorder, unspecified: Secondary | ICD-10-CM | POA: Diagnosis present

## 2017-04-27 DIAGNOSIS — Z6841 Body Mass Index (BMI) 40.0 and over, adult: Secondary | ICD-10-CM

## 2017-04-27 DIAGNOSIS — Z720 Tobacco use: Secondary | ICD-10-CM

## 2017-04-27 DIAGNOSIS — Z716 Tobacco abuse counseling: Secondary | ICD-10-CM | POA: Diagnosis not present

## 2017-04-27 DIAGNOSIS — Z8709 Personal history of other diseases of the respiratory system: Secondary | ICD-10-CM | POA: Diagnosis not present

## 2017-04-27 LAB — CBC
HCT: 34.9 % — ABNORMAL LOW (ref 36.0–46.0)
Hemoglobin: 11.4 g/dL — ABNORMAL LOW (ref 12.0–15.0)
MCH: 28.5 pg (ref 26.0–34.0)
MCHC: 32.7 g/dL (ref 30.0–36.0)
MCV: 87.3 fL (ref 78.0–100.0)
Platelets: 334 10*3/uL (ref 150–400)
RBC: 4 MIL/uL (ref 3.87–5.11)
RDW: 13.3 % (ref 11.5–15.5)
WBC: 5.9 10*3/uL (ref 4.0–10.5)

## 2017-04-27 LAB — I-STAT TROPONIN, ED: Troponin i, poc: 0 ng/mL (ref 0.00–0.08)

## 2017-04-27 LAB — BASIC METABOLIC PANEL
Anion gap: 7 (ref 5–15)
BUN: 14 mg/dL (ref 6–20)
CO2: 25 mmol/L (ref 22–32)
Calcium: 9.2 mg/dL (ref 8.9–10.3)
Chloride: 107 mmol/L (ref 101–111)
Creatinine, Ser: 0.76 mg/dL (ref 0.44–1.00)
GFR calc Af Amer: >60 mL/min (ref >60)
GFR calc non Af Amer: >60 mL/min (ref >60)
Glucose, Bld: 111 mg/dL — ABNORMAL HIGH (ref 65–99)
Potassium: 3.6 mmol/L (ref 3.5–5.1)
Sodium: 139 mmol/L (ref 135–145)

## 2017-04-27 LAB — I-STAT BETA HCG BLOOD, ED (MC, WL, AP ONLY): I-stat hCG, quantitative: <5 m[IU]/mL (ref <5)

## 2017-04-27 LAB — TROPONIN I: Troponin I: <0.03 ng/mL (ref <0.03)

## 2017-04-27 MED ORDER — TOPIRAMATE 100 MG PO TABS
100.0000 mg | ORAL_TABLET | Freq: Every day | ORAL | Status: DC
  Administered 2017-04-27 – 2017-04-29 (×3): 100 mg via ORAL
  Filled 2017-04-27 (×4): qty 1

## 2017-04-27 MED ORDER — ASPIRIN 81 MG PO CHEW: 324.0000 mg | CHEWABLE_TABLET | ORAL | Status: AC

## 2017-04-27 MED ORDER — NITROGLYCERIN 0.4 MG SL SUBL
0.4000 mg | SUBLINGUAL_TABLET | SUBLINGUAL | Status: DC | PRN
  Administered 2017-04-27 (×2): 0.4 mg via SUBLINGUAL
  Filled 2017-04-27: qty 1

## 2017-04-27 MED ORDER — QUETIAPINE FUMARATE 100 MG PO TABS
100.0000 mg | ORAL_TABLET | Freq: Every day | ORAL | Status: DC
  Administered 2017-04-27 – 2017-04-29 (×3): 100 mg via ORAL
  Filled 2017-04-27 (×3): qty 1

## 2017-04-27 MED ORDER — ASPIRIN 81 MG PO CHEW
324.0000 mg | CHEWABLE_TABLET | Freq: Once | ORAL | Status: AC
  Administered 2017-04-27: 324 mg via ORAL

## 2017-04-27 MED ORDER — ASPIRIN 81 MG PO CHEW
CHEWABLE_TABLET | ORAL | Status: AC
  Filled 2017-04-27: qty 4

## 2017-04-27 MED ORDER — ASPIRIN 300 MG RE SUPP: 300.0000 mg | RECTAL | Status: AC

## 2017-04-27 MED ORDER — ONDANSETRON HCL 4 MG/2ML IJ SOLN: 4.0000 mg | Freq: Four times a day (QID) | INTRAMUSCULAR | Status: DC | PRN

## 2017-04-27 MED ORDER — ACETAMINOPHEN 325 MG PO TABS: 650.0000 mg | ORAL_TABLET | ORAL | Status: DC | PRN

## 2017-04-27 MED ORDER — NITROGLYCERIN 0.4 MG SL SUBL
0.4000 mg | SUBLINGUAL_TABLET | SUBLINGUAL | Status: DC | PRN
  Administered 2017-04-27 (×4): 0.4 mg via SUBLINGUAL
  Filled 2017-04-27: qty 1
  Filled 2017-04-27: qty 25
  Filled 2017-04-27: qty 1

## 2017-04-27 MED ORDER — ASPIRIN EC 81 MG PO TBEC
81.0000 mg | DELAYED_RELEASE_TABLET | Freq: Every day | ORAL | Status: DC
  Administered 2017-04-28 – 2017-04-30 (×2): 81 mg via ORAL
  Filled 2017-04-27 (×2): qty 1

## 2017-04-27 MED ORDER — ENOXAPARIN SODIUM 40 MG/0.4ML ~~LOC~~ SOLN
40.0000 mg | SUBCUTANEOUS | Status: DC
  Administered 2017-04-27 – 2017-04-29 (×3): 40 mg via SUBCUTANEOUS
  Filled 2017-04-27 (×4): qty 0.4

## 2017-04-27 NOTE — Progress Notes (Signed)
Patient is admitted to [id number] ED. Admission VS is stable. Patient is AOx4 with no family at the bedside

## 2017-04-27 NOTE — ED Triage Notes (Signed)
Pt states that this morning she began experiencing a headache with blurry vision. She states that has since subsided, but now she has centralized chest pain that radiates down her L arm. A&Ox4. Ambulatory. VSS.

## 2017-04-27 NOTE — Progress Notes (Addendum)
Patient had a chest pain episode and nitrostat 0.4X2 was given to the patient. chest pain decreased from a score of  9 to 4

## 2017-04-27 NOTE — H&P (Signed)
History and Physical    Tracy Valenzuela PYP:[id number] DOB: [birth year] DOA: 04/27/2017    PCP: Brunetta Jeans, PA-C  Patient coming from: home  Chief Complaint: chest pain  HPI: Tracy Valenzuela is a 41 y.o. female with medical history of diabetes, morbid obesity, smoking, bipolar disorder who presents with chest pain starting this AM. When she woke up she noted dizziness but went to work. 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 Soon afterwards she began to have left arm throbbing. The chest pain and arm pain came and went. She became lightheaded and sweaty while giving out the meds and eventually decided to come to the hospital. Nitro x 2 helped the chest discomfort resolve but her arm is still aching. She had something similar happen about 10 yrs ago and had a a cath which was normal.    ED Course: given Nitro x 2 and aspirin  Review of Systems:  All other systems reviewed and apart from HPI, are negative.  Past Medical History:  Diagnosis Date  . Anemia   . Anxiety   . ASCUS with positive high risk HPV 11/2014  . Asthma   . Bipolar 1 disorder ([place])   . GERD (gastroesophageal reflux disease)   . Heart murmur   . LGSIL (low grade squamous intraepithelial dysplasia) 11/2014   colposcopy biopsy.  recommend follow up pap in one year  . Pre-diabetes   . Prediabetes     Past Surgical History:  Procedure Laterality Date  . CARDIAC CATHETERIZATION    . CYSTOSCOPY  10/30/2016   Procedure: CYSTOSCOPY;  Surgeon: Anastasio Auerbach, MD;  Location: [facility];  Service: Gynecology;;  . LAPAROSCOPIC VAGINAL HYSTERECTOMY WITH SALPINGECTOMY Bilateral 10/30/2016   Procedure: LAPAROSCOPIC ASSISTED VAGINAL HYSTERECTOMY WITH SALPINGECTOMY;  Surgeon: Anastasio Auerbach, MD;  Location: [facility];  Service: Gynecology;  Laterality: Bilateral;  . TUBAL LIGATION      Social History:  She  smokes 2-3 cigarettes/ day.  She reports that she drinks alcohol. She reports that she does not use drugs.  Allergies  Allergen Reactions  . Penicillins Hives    Has patient had a PCN reaction causing immediate rash, facial/tongue/throat swelling, SOB or lightheadedness with hypotension:no Has patient had a PCN reaction causing severe rash involving mucus membranes or skin necrosis: Yes Has patient had a PCN reaction that required hospitalization No Has patient had a PCN reaction occurring within the last 10 years: No If all of the above answers are "NO", then may proceed with Cephalosporin use.     Family History  Problem Relation Age of Onset  . Lupus Mother   . Heart disease Mother   . Diabetes Maternal Grandmother   . Heart disease Maternal Grandmother   . Hypertension Maternal Grandmother   . Prostate cancer Maternal Grandfather   . Alzheimer's disease Paternal Grandfather   . Colon cancer Neg Hx   . Stomach cancer Neg Hx      Prior to Admission medications   Medication Sig Start Date End Date Taking? Authorizing Provider  QUEtiapine (SEROQUEL) 100 MG tablet Take 1 tablet (100 mg total) by mouth at bedtime. 12/13/16  Yes Golden Circle, FNP  topiramate (TOPAMAX) 50 MG tablet Take 100 mg by mouth at bedtime.   Yes [provider]  doxycycline (VIBRAMYCIN) 100 MG capsule Take 1 capsule (100 mg total) 2 (two) times daily by mouth.  Patient not taking: Reported on 04/27/2017 03/08/17   Brunetta Jeans, PA-C  topiramate (TOPAMAX) 50 MG tablet Take 1 tablet by mouth daily x 1 week. Then increase to 1 tablet twice daily. Patient not taking: Reported on 04/27/2017 03/08/17   Brunetta Jeans, PA-C    Physical Exam: Wt Readings from Last 3 Encounters:  03/08/17 120.7 kg (266 lb)  12/13/16 121.6 kg (268 lb)  10/30/16 122.5 kg (270 lb)   Vitals:   04/27/17 1500 04/27/17 1505 04/27/17 1510 04/27/17 1511  BP: 113/65 109/65 117/77 117/77  Pulse: 75 67 66 72  Resp: (!)  24 (!) 23 20 (!) 23  Temp:      TempSrc:      SpO2: 97% 97% 99% 99%      Constitutional: NAD, calm, comfortable Eyes: PERTLA, lids and conjunctivae normal ENMT: Mucous membranes are moist. Posterior pharynx clear of any exudate or lesions. Normal dentition.  Neck: normal, supple, no masses, no thyromegaly Respiratory: clear to auscultation bilaterally, no wheezing, no crackles. Normal respiratory effort. No accessory muscle use.  Chest: left chest wall tender to palpation Cardiovascular: S1 & S2 heard, regular rate and rhythm, no murmurs / rubs / gallops. No extremity edema. 2+ pedal pulses. No carotid bruits.  Abdomen: No distension, no tenderness, no masses palpated. No hepatosplenomegaly. Bowel sounds normal.  Musculoskeletal: no clubbing / cyanosis. No joint deformity upper and lower extremities. Good ROM, no contractures. Normal muscle tone.  Skin: no rashes, lesions, ulcers. No induration Neurologic: CN 2-12 grossly intact. Sensation intact, DTR normal. Strength 5/5 in all 4 limbs.  Psychiatric: Normal judgment and insight. Alert and oriented x 3. Normal mood.     Labs on Admission: I have personally reviewed following labs and imaging studies  CBC: Recent Labs  Lab 04/27/17 1430  WBC 5.9  HGB 11.4*  HCT 34.9*  MCV 87.3  PLT 716   Basic Metabolic Panel: Recent Labs  Lab 04/27/17 1430  NA 139  K 3.6  CL 107  CO2 25  GLUCOSE 111*  BUN 14  CREATININE 0.76  CALCIUM 9.2   GFR: CrCl cannot be calculated (Unknown ideal weight.). Liver Function Tests: No results for input(s): AST, ALT, ALKPHOS, BILITOT, PROT, ALBUMIN in the last 168 hours. No results for input(s): LIPASE, AMYLASE in the last 168 hours. No results for input(s): AMMONIA in the last 168 hours. Coagulation Profile: No results for input(s): INR, PROTIME in the last 168 hours. Cardiac Enzymes: No results for input(s): CKTOTAL, CKMB, CKMBINDEX, TROPONINI in the last 168 hours. BNP (last 3 results) No  results for input(s): PROBNP in the last 8760 hours. HbA1C: No results for input(s): HGBA1C in the last 72 hours. CBG: No results for input(s): GLUCAP in the last 168 hours. Lipid Profile: No results for input(s): CHOL, HDL, LDLCALC, TRIG, CHOLHDL, LDLDIRECT in the last 72 hours. Thyroid Function Tests: No results for input(s): TSH, T4TOTAL, FREET4, T3FREE, THYROIDAB in the last 72 hours. Anemia Panel: No results for input(s): VITAMINB12, FOLATE, FERRITIN, TIBC, IRON, RETICCTPCT in the last 72 hours. Urine analysis:    Component Value Date/Time   COLORURINE YELLOW 04/02/2016 1257   APPEARANCEUR CLOUDY (A) 04/02/2016 1257   LABSPEC 1.025 04/02/2016 1257   PHURINE 6.0 04/02/2016 1257   GLUCOSEU NEGATIVE 04/02/2016 1257   HGBUR TRACE (A) 04/02/2016 1257   BILIRUBINUR NEGATIVE 04/02/2016 1257   KETONESUR NEGATIVE 04/02/2016 1257   PROTEINUR TRACE (A) 04/02/2016 1257   UROBILINOGEN 0.2 11/04/2014 1458   NITRITE NEGATIVE  04/02/2016 1257   LEUKOCYTESUR 1+ (A) 04/02/2016 1257   Sepsis Labs: @LABRCNTIP (procalcitonin:4,lacticidven:4) )No results found for this or any previous visit (from the past 240 hour(s)).   Radiological Exams on Admission: Dg Chest 2 View  Result Date: 04/27/2017 CLINICAL DATA:  Left-sided chest pain EXAM: CHEST  2 VIEW COMPARISON:  08/11/2013 FINDINGS: Cardiac enlargement. Both lungs are clear. No pleural effusion or edema. Visualized skeletal structures are unremarkable. IMPRESSION: 1. No acute intracranial abnormalities. Electronically Signed   By: Kerby Moors M.D.   On: 04/27/2017 14:30    EKG: Independently reviewed. Mild ST elevation in 1 an aVl, T wave inversions in lead III, Avf and V3-4  Assessment/Plan Principal Problem:   Chest pain - has h/o smoking, obesity, pre-diabetes - EKG is a little abnormal- see above - check 3 sets of Troponin and ECHO, repeat EKG tomorrow  - pain has reproducible component as well and may be musculoskeletal but left  arm pain, dizziness and diaphoresis would not be related to muscular issues alone - will order a exercise stress test for tomorrow  Active Problems:   Bipolar 1 disorder, mixed, moderate  - cont home meds    Morbid obesity - has been losing weight    Prediabetes - cont weight loss    Nicotine abuse  - smokes 2 cigarettes a day and is trying to quit    DVT prophylaxis: Lovenox  Code Status: full code   Family Communication:   Disposition Plan:   Consults called: ER spoke with cardiology and [facility] hospitalist asked to call back if stress test is + Admission status: observation    Debbe Odea MD [facility] Pager: [email or web address] Password [email or web address] 7PM-7AM, please contact night-coverage   04/27/2017, 4:40 PM

## 2017-04-27 NOTE — ED Provider Notes (Signed)
[facility] DEPT Provider Note   CSN: [id number] Arrival date & time: 04/27/17  1249     History   Chief Complaint Chief Complaint  Patient presents with  . Chest Pain    HPI Tracy Valenzuela is a 41 y.o. female.  HPI  41 year old female presents with chest pain.  She states that at around 7 AM she developed a headache and some dizziness.  This was while she was at work.  Then at 9 AM she developed chest pressure.  This chest pressure has been coming and going since.  It will last about 5 minutes at a time.  It will radiate down her left arm.  She has shortness of breath and increased dizziness when she ambulates.  This is all new today.  Past Medical History:  Diagnosis Date  . Anemia   . Anxiety   . ASCUS with positive high risk HPV 11/2014  . Asthma   . Bipolar 1 disorder ([place])   . GERD (gastroesophageal reflux disease)   . Heart murmur   . LGSIL (low grade squamous intraepithelial dysplasia) 11/2014   colposcopy biopsy.  recommend follow up pap in one year  . Pre-diabetes   . Prediabetes     Patient Active Problem List   Diagnosis Date Noted  . Acute bacterial sinusitis 03/10/2017  . Leiomyoma 10/30/2016  . Prediabetes 08/21/2016  . Gastroesophageal reflux disease 08/21/2016  . Thornwaldt's cyst 05/29/2016  . Morbid obesity ([place]) 11/09/2015  . Bipolar 1 disorder, mixed, moderate ([place]) 09/30/2015  . Decreased attention Span 09/30/2015  . Iron deficiency anemia 10/19/2014    Past Surgical History:  Procedure Laterality Date  . CARDIAC CATHETERIZATION    . CYSTOSCOPY  10/30/2016   Procedure: CYSTOSCOPY;  Surgeon: Anastasio Auerbach, MD;  Location: [facility];  Service: Gynecology;;  . LAPAROSCOPIC VAGINAL HYSTERECTOMY WITH SALPINGECTOMY Bilateral 10/30/2016   Procedure: LAPAROSCOPIC ASSISTED VAGINAL HYSTERECTOMY WITH SALPINGECTOMY;  Surgeon: Anastasio Auerbach, MD;  Location: [facility];  Service: Gynecology;  Laterality: Bilateral;  . TUBAL  LIGATION      OB History    Gravida Para Term Preterm AB Living   3 1   1 1 3    SAB TAB Ectopic Multiple Live Births   1   0          Obstetric Comments   Had a set of twins that were preterm (30 weeks)       Home Medications    Prior to Admission medications   Medication Sig Start Date End Date Taking? Authorizing Provider  QUEtiapine (SEROQUEL) 100 MG tablet Take 1 tablet (100 mg total) by mouth at bedtime. 12/13/16  Yes Golden Circle, FNP  topiramate (TOPAMAX) 50 MG tablet Take 100 mg by mouth at bedtime.   Yes [provider]  doxycycline (VIBRAMYCIN) 100 MG capsule Take 1 capsule (100 mg total) 2 (two) times daily by mouth. Patient not taking: Reported on 04/27/2017 03/08/17   Brunetta Jeans, PA-C  topiramate (TOPAMAX) 50 MG tablet Take 1 tablet by mouth daily x 1 week. Then increase to 1 tablet twice daily. Patient not taking: Reported on 04/27/2017 03/08/17   Brunetta Jeans, PA-C    Family History Family History  Problem Relation Age of Onset  . Lupus Mother   . Heart disease Mother   . Diabetes Maternal Grandmother   . Heart disease Maternal Grandmother   . Hypertension Maternal Grandmother   . Prostate cancer Maternal Grandfather   .  Alzheimer's disease Paternal Grandfather   . Colon cancer Neg Hx   . Stomach cancer Neg Hx     Social History Social History   Tobacco Use  . Smoking status: Former Smoker    Types: Cigarettes  . Smokeless tobacco: Never Used  . Tobacco comment: occassional  Substance Use Topics  . Alcohol use: Yes    Alcohol/week: 0.0 oz  . Drug use: No     Allergies   Penicillins   Review of Systems Review of Systems  Constitutional: Negative for diaphoresis.  Respiratory: Positive for shortness of breath.   Cardiovascular: Positive for chest pain. Negative for leg swelling.  Gastrointestinal: Negative for nausea and vomiting.  Neurological: Positive for dizziness and headaches.  All other systems reviewed and  are negative.    Physical Exam Updated Vital Signs BP 122/73 (BP Location: Right Arm)   Pulse 66   Temp 97.8 F (36.6 C) (Oral)   Resp 16   LMP  (LMP Unknown) Comment: neg beta hcg 05-23-16  SpO2 97%   Physical Exam  Constitutional: She is oriented to person, place, and time. She appears well-developed and well-nourished.  HENT:  Head: Normocephalic and atraumatic.  Right Ear: External ear normal.  Left Ear: External ear normal.  Nose: Nose normal.  Eyes: Right eye exhibits no discharge. Left eye exhibits no discharge.  Cardiovascular: Normal rate, regular rhythm and normal heart sounds.  Pulses:      Radial pulses are 2+ on the right side, and 2+ on the left side.  Pulmonary/Chest: Effort normal and breath sounds normal. She exhibits tenderness (diffuse anterior).  Abdominal: Soft. There is no tenderness.  Neurological: She is alert and oriented to person, place, and time.  Skin: Skin is warm and dry.  Nursing note and vitals reviewed.    ED Treatments / Results  Labs (all labs ordered are listed, but only abnormal results are displayed) Labs Reviewed  BASIC METABOLIC PANEL - Abnormal; Notable for the following components:      Result Value   Glucose, Bld 111 (*)    All other components within normal limits  CBC - Abnormal; Notable for the following components:   Hemoglobin 11.4 (*)    HCT 34.9 (*)    All other components within normal limits  HIV ANTIBODY (ROUTINE TESTING)  CBC  CREATININE, SERUM  TROPONIN I  TROPONIN I  TROPONIN I  I-STAT TROPONIN, ED  I-STAT BETA HCG BLOOD, ED ([facility], [facility], [facility] ONLY)    EKG  EKG Interpretation  Date/Time:  Saturday April 27 2017 14:03:27 EST Ventricular Rate:  76 PR Interval:    QRS Duration: 98 QT Interval:  371 QTC Calculation: 418 R Axis:   19 Text Interpretation:  Sinus rhythm Borderline repolarization abnormality ST elevation I, AVL, and depressions/inversions in inferior leads similar to multiple priors T wave  inversions V3-6 new since 2015 Confirmed by Sherwood Gambler [phone]) on 04/27/2017 2:22:12 PM        Radiology Dg Chest 2 View  Result Date: 04/27/2017 CLINICAL DATA:  Left-sided chest pain EXAM: CHEST  2 VIEW COMPARISON:  08/11/2013 FINDINGS: Cardiac enlargement. Both lungs are clear. No pleural effusion or edema. Visualized skeletal structures are unremarkable. IMPRESSION: 1. No acute intracranial abnormalities. Electronically Signed   By: Kerby Moors M.D.   On: 04/27/2017 14:30    Procedures Procedures (including critical care time)  Medications Ordered in ED Medications  nitroGLYCERIN (NITROSTAT) SL tablet 0.4 mg (0.4 mg Sublingual Given 04/27/17 1438)  aspirin  chewable tablet 324 mg (324 mg Oral Given 04/27/17 1438)     Initial Impression / Assessment and Plan / ED Course  I have reviewed the triage vital signs and the nursing notes.  Pertinent labs & imaging results that were available during my care of the patient were reviewed by me and considered in my medical decision making (see chart for details).     Patient's ST elevation and depressions in limb leads is stable from multiple prior ECGs. T wave changes anteriorly are new. Repeat ECG shows mild improvement. Given these changes in setting of chest pain, cardiology consulted. Dr. Rayann Heman reviewed chart, asks for medicine admission, cycle troponins, and get [facility] tomorrow. Consult cards tomorrow. Pain free after aspirin and nitroglycerin. Concern is for cardiac chest pain.   Final Clinical Impressions(s) / ED Diagnoses   Final diagnoses:  Chest pressure    ED Discharge Orders    None       Sherwood Gambler, MD 04/27/17 [phone]

## 2017-04-28 ENCOUNTER — Observation Stay (HOSPITAL_BASED_OUTPATIENT_CLINIC_OR_DEPARTMENT_OTHER): Payer: 59

## 2017-04-28 ENCOUNTER — Encounter (HOSPITAL_COMMUNITY): Payer: Self-pay | Admitting: *Deleted

## 2017-04-28 DIAGNOSIS — I361 Nonrheumatic tricuspid (valve) insufficiency: Secondary | ICD-10-CM

## 2017-04-28 DIAGNOSIS — R072 Precordial pain: Secondary | ICD-10-CM | POA: Diagnosis not present

## 2017-04-28 LAB — TROPONIN I
Troponin I: <0.03 ng/mL (ref <0.03)
Troponin I: <0.03 ng/mL (ref <0.03)

## 2017-04-28 LAB — ECHOCARDIOGRAM COMPLETE
Height: 67 in
Weight: 4250.47 oz

## 2017-04-28 LAB — HIV ANTIBODY (ROUTINE TESTING W REFLEX): HIV Screen 4th Generation wRfx: NONREACTIVE

## 2017-04-28 NOTE — Progress Notes (Signed)
  Echocardiogram 2D Echocardiogram has been performed.  Merrie Roof F 04/28/2017, 10:09 AM

## 2017-04-28 NOTE — Progress Notes (Signed)
PROGRESS NOTE    Tracy Valenzuela  LPF:[id number] DOB: 02/08/1976 DOA: 04/27/2017 PCP: Brunetta Jeans, PA-C     Brief Narrative:  Tracy Valenzuela is a 41 yo female with past medical history of diabetes, morbid obesity, smoking, bipolar disorder who presents with chest pain starting 9am morning of admission. When she woke up she noted dizziness but went to work. 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 Soon afterwards she began to have left arm throbbing. The chest pain and arm pain came and went. She became lightheaded and sweaty while giving out the meds and eventually decided to come to the hospital. Nitro x 2 helped the chest discomfort resolve but her arm is still aching. She had something similar happen about 10 yrs ago and had a a cath which was normal. Cardiology was consulted by EDP who asked for a stress test, and if abnormal, they will formally consult.   Assessment & Plan:   Principal Problem:   Chest pain Active Problems:   Bipolar 1 disorder, mixed, moderate (H[facility])   Morbid obesity (HCC)   Prediabetes   Nicotine abuse  Chest pain -Troponin x 4 negative -No current CP this morning -Aspirin  -Echo and stress test pending -If positive stress test, cardiology will formally consult  Bipolar disorder -Continue home meds, seroquel, topamax   Prediabetes -Continue weight loss  Nicotine abuse -Cessation counseling  Morbid obesity -Body mass index is 41.61 kg/m.   DVT prophylaxis: lovenox Code Status: Full Family Communication: No family at bedside Disposition Plan: Pending stress test result   Consultants:   None  Procedures:   None   Antimicrobials:  Anti-infectives (From admission, onward)   None        Subjective: No current chest pain or shortness of breath on exam.  Had some nausea but none  currently.  Objective: Vitals:   04/27/17 1700 04/27/17 1743 04/27/17 2038 04/28/17 0500  BP: 122/67 101/64 118/65 115/64  Pulse:  64 72 61  Resp: 20  20 19   Temp:  (!) 97.5 F (36.4 C) 98.5 F (36.9 C) 97.8 F (36.6 C)  TempSrc:  Oral Oral Oral  SpO2:  100% 96% 99%  Weight:  120.5 kg (265 lb 10.5 oz)    Height:  5\' 7"  (1.702 m)     No intake or output data in the 24 hours ending 04/28/17 1256 Filed Weights   04/27/17 1743  Weight: 120.5 kg (265 lb 10.5 oz)    Examination:  General exam: Appears calm and comfortable  Respiratory system: Clear to auscultation. Respiratory effort normal. Cardiovascular system: S1 & S2 heard, RRR. No JVD, murmurs, rubs, gallops or clicks. No pedal edema. Gastrointestinal system: Abdomen is nondistended, soft and nontender. No organomegaly or masses felt. Normal bowel sounds heard. Central nervous system: Alert and oriented. No focal neurological deficits. Extremities: Symmetric 5 x 5 power. Skin: No rashes, lesions or ulcers Psychiatry: Judgement and insight appear normal. Mood & affect appropriate.   Data Reviewed: I have personally reviewed following labs and imaging studies  CBC: Recent Labs  Lab 04/27/17 1430  WBC 5.9  HGB 11.4*  HCT 34.9*  MCV 87.3  PLT 532   Basic Metabolic Panel: Recent Labs  Lab 04/27/17 1430  NA 139  K 3.6  CL 107  CO2 25  GLUCOSE 111*  BUN 14  CREATININE 0.76  CALCIUM  9.2   GFR: Estimated Creatinine Clearance: 127 mL/min (by C-G formula based on SCr of 0.76 mg/dL). Liver Function Tests: No results for input(s): AST, ALT, ALKPHOS, BILITOT, PROT, ALBUMIN in the last 168 hours. No results for input(s): LIPASE, AMYLASE in the last 168 hours. No results for input(s): AMMONIA in the last 168 hours. Coagulation Profile: No results for input(s): INR, PROTIME in the last 168 hours. Cardiac Enzymes: Recent Labs  Lab 04/27/17 1800 04/28/17 0015 04/28/17 0644  TROPONINI <0.03 <0.03 <0.03   BNP  (last 3 results) No results for input(s): PROBNP in the last 8760 hours. HbA1C: No results for input(s): HGBA1C in the last 72 hours. CBG: No results for input(s): GLUCAP in the last 168 hours. Lipid Profile: No results for input(s): CHOL, HDL, LDLCALC, TRIG, CHOLHDL, LDLDIRECT in the last 72 hours. Thyroid Function Tests: No results for input(s): TSH, T4TOTAL, FREET4, T3FREE, THYROIDAB in the last 72 hours. Anemia Panel: No results for input(s): VITAMINB12, FOLATE, FERRITIN, TIBC, IRON, RETICCTPCT in the last 72 hours. Sepsis Labs: No results for input(s): PROCALCITON, LATICACIDVEN in the last 168 hours.  No results found for this or any previous visit (from the past 240 hour(s)).     Radiology Studies: Dg Chest 2 View  Result Date: 04/27/2017 CLINICAL DATA:  Left-sided chest pain EXAM: CHEST  2 VIEW COMPARISON:  08/11/2013 FINDINGS: Cardiac enlargement. Both lungs are clear. No pleural effusion or edema. Visualized skeletal structures are unremarkable. IMPRESSION: 1. No acute intracranial abnormalities. Electronically Signed   By: Kerby Moors M.D.   On: 04/27/2017 14:30      Scheduled Meds: . aspirin  324 mg Oral NOW   Or  . aspirin  300 mg Rectal NOW  . aspirin EC  81 mg Oral Daily  . enoxaparin (LOVENOX) injection  40 mg Subcutaneous Q24H  . QUEtiapine  100 mg Oral QHS  . topiramate  100 mg Oral QHS   Continuous Infusions:   LOS: 0 days    Time spent: 40 minutes   Dessa Phi, DO [facility] [email or web address] Password [email or web address] 04/28/2017, 12:56 PM

## 2017-04-29 ENCOUNTER — Ambulatory Visit (HOSPITAL_COMMUNITY)
Admission: EM | Admit: 2017-04-29 | Discharge: 2017-04-29 | Disposition: A | Discharge status: Home / Self Care | Payer: 59 | Source: Home / Self Care | Attending: Internal Medicine | Admitting: Internal Medicine

## 2017-04-29 ENCOUNTER — Inpatient Hospital Stay (HOSPITAL_COMMUNITY): Payer: 59

## 2017-04-29 DIAGNOSIS — R072 Precordial pain: Secondary | ICD-10-CM

## 2017-04-29 DIAGNOSIS — R079 Chest pain, unspecified: Secondary | ICD-10-CM

## 2017-04-29 DIAGNOSIS — F3162 Bipolar disorder, current episode mixed, moderate: Secondary | ICD-10-CM | POA: Diagnosis not present

## 2017-04-29 LAB — CBC
HCT: 33.9 % — ABNORMAL LOW (ref 36.0–46.0)
Hemoglobin: 10.9 g/dL — ABNORMAL LOW (ref 12.0–15.0)
MCH: 28.1 pg (ref 26.0–34.0)
MCHC: 32.2 g/dL (ref 30.0–36.0)
MCV: 87.4 fL (ref 78.0–100.0)
Platelets: 288 10*3/uL (ref 150–400)
RBC: 3.88 MIL/uL (ref 3.87–5.11)
RDW: 13.3 % (ref 11.5–15.5)
WBC: 5.2 10*3/uL (ref 4.0–10.5)

## 2017-04-29 LAB — BASIC METABOLIC PANEL
Anion gap: 5 (ref 5–15)
BUN: 13 mg/dL (ref 6–20)
CO2: 24 mmol/L (ref 22–32)
Calcium: 8.8 mg/dL — ABNORMAL LOW (ref 8.9–10.3)
Chloride: 108 mmol/L (ref 101–111)
Creatinine, Ser: 0.78 mg/dL (ref 0.44–1.00)
GFR calc Af Amer: >60 mL/min (ref >60)
GFR calc non Af Amer: >60 mL/min (ref >60)
Glucose, Bld: 114 mg/dL — ABNORMAL HIGH (ref 65–99)
Potassium: 3.7 mmol/L (ref 3.5–5.1)
Sodium: 137 mmol/L (ref 135–145)

## 2017-04-29 MED ORDER — REGADENOSON 0.4 MG/5ML IV SOLN
INTRAVENOUS | Status: AC
  Filled 2017-04-29: qty 5

## 2017-04-29 MED ORDER — REGADENOSON 0.4 MG/5ML IV SOLN
0.4000 mg | Freq: Once | INTRAVENOUS | Status: AC
  Administered 2017-04-29: 0.4 mg via INTRAVENOUS
  Filled 2017-04-29: qty 5

## 2017-04-29 NOTE — Progress Notes (Signed)
   Archie Patten presented for a nuclear stress test today.  No immediate complications.  Stress imaging is pending at this time.  Preliminary EKG findings may be listed in the chart, but the stress test result will not be finalized until perfusion imaging is complete.  Tami Lin Duke, PA-C 04/29/2017, 1:24 PM

## 2017-04-29 NOTE — Progress Notes (Signed)
Transportation arranged with [facility] for Nuclear Med at [facility] on 04/30/17 8am., Tracy Valenzuela

## 2017-04-29 NOTE — Progress Notes (Signed)
Pt presented for POET. Test was aborted due to fatigue. She was unable to walk more than 3 minutes and did not reach her target heart rate. She reported substernal chest pain during the test.   Ledora Bottcher, PA-C 04/29/2017, 12:05 PM [phone]

## 2017-04-30 DIAGNOSIS — R072 Precordial pain: Secondary | ICD-10-CM | POA: Diagnosis not present

## 2017-04-30 DIAGNOSIS — F3162 Bipolar disorder, current episode mixed, moderate: Secondary | ICD-10-CM | POA: Diagnosis not present

## 2017-04-30 LAB — NM MYOCAR MULTI W/SPECT W/WALL MOTION / EF
MPHR: 181 {beats}/min
Peak HR: 111 {beats}/min
Percent HR: 61 %
Rest HR: 60 {beats}/min

## 2017-04-30 MED ORDER — TECHNETIUM TC 99M TETROFOSMIN IV KIT
30.0000 | PACK | Freq: Once | INTRAVENOUS | Status: AC | PRN
  Administered 2017-04-29: 30 via INTRAVENOUS

## 2017-04-30 MED ORDER — TECHNETIUM TC 99M TETROFOSMIN IV KIT: 10.0000 | PACK | Freq: Once | INTRAVENOUS | Status: DC | PRN

## 2017-04-30 MED ORDER — TECHNETIUM TC 99M TETROFOSMIN IV KIT
30.0000 | PACK | Freq: Once | INTRAVENOUS | Status: AC | PRN
  Administered 2017-04-30: 30 via INTRAVENOUS

## 2017-04-30 NOTE — Progress Notes (Signed)
[facility] PROGRESS NOTE    Progress Note  Tracy Valenzuela  HER:[id number] DOB: Jul 27, 1975 DOA: 04/27/2017 PCP: Brunetta Jeans, PA-C     Brief Narrative:   Tracy Valenzuela is an 42 y.o. female with past medical history of diabetes, morbid obesity, smoking, bipolar disorder who presents with chest pain starting 9am morning of admission. When she woke up she noted dizziness but went to work. 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 Soon afterwards she began to have left arm throbbing. The chest pain and arm pain came and went. She became lightheaded and sweaty while giving out the meds and eventually decided to come to the hospital. Nitro x 2 helped the chest discomfort   Assessment/Plan:   Chest pain 4 of cardiac biomarkers negative, continue aspirin. She is going for 2-day stress test.  She denies any chest pain or shortness of breath.  Bipolar 1 disorder, mixed, moderate (HCC) Continue current home regimen.  Morbid obesity ([place]) Counseling.  Prediabetes Encourage weight loss.  Nicotine abuse Counseling.  DVT prophylaxis: lovenox Family Communication:none Disposition Plan/Barrier to D/C: home once stress test negative Code Status:     Code Status Orders  (From admission, onward)        Start     Ordered   04/27/17 1633  Full code  Continuous     04/27/17 1633    Code Status History    Date Active Date Inactive Code Status Order ID Comments User Context   10/30/2016 15:03 10/31/2016 13:05 Full Code [id number]  Fontaine, Belinda Block, MD Inpatient        IV Access:    Peripheral IV   Procedures and diagnostic studies:   No results found.   Medical Consultants:    None.  Anti-Infectives:   none  Subjective:    Tracy Valenzuela no complaints chest pain-free she denies any shortness of breath.  Objective:      Vitals:   04/28/17 2022 04/29/17 0502 04/29/17 2014 04/30/17 0511  BP: 120/63 (!) 119/56 (!) 112/42 (!) 104/52  Pulse: 64 71 68 63  Resp: 18 20 20 20   Temp: 97.8 F (36.6 C) 97.8 F (36.6 C) 98.2 F (36.8 C) 98 F (36.7 C)  TempSrc: Oral Oral Oral Oral  SpO2: 100% 100% 97% 98%  Weight:      Height:       No intake or output data in the 24 hours ending 04/30/17 0832 Filed Weights   04/27/17 1743  Weight: 120.5 kg (265 lb 10.5 oz)    Exam: General exam: In no acute distress. Respiratory system: Good air movement and clear to auscultation. Cardiovascular system: S1 & S2 heard, RRR. No JVD. Gastrointestinal system: Abdomen is nondistended, soft and nontender.  Central nervous system: Alert and oriented. No focal neurological deficits. Extremities: No pedal edema. Skin: No rashes, lesions or ulcers   Data Reviewed:    Labs: Basic Metabolic Panel: Recent Labs  Lab 04/27/17 1430 04/29/17 0454  NA 139 137  K 3.6 3.7  CL 107 108  CO2 25 24  GLUCOSE 111* 114*  BUN 14 13  CREATININE 0.76 0.78  CALCIUM 9.2 8.8*   GFR Estimated Creatinine Clearance: 127 mL/min (by C-G formula based on SCr of 0.78 mg/dL). Liver Function Tests: No results for input(s): AST, ALT, ALKPHOS, BILITOT, PROT, ALBUMIN in the last 168 hours. No results for  input(s): LIPASE, AMYLASE in the last 168 hours. No results for input(s): AMMONIA in the last 168 hours. Coagulation profile No results for input(s): INR, PROTIME in the last 168 hours.  CBC: Recent Labs  Lab 04/27/17 1430 04/29/17 0454  WBC 5.9 5.2  HGB 11.4* 10.9*  HCT 34.9* 33.9*  MCV 87.3 87.4  PLT 334 288   Cardiac Enzymes: Recent Labs  Lab 04/27/17 1800 04/28/17 0015 04/28/17 0644  TROPONINI <0.03 <0.03 <0.03   BNP (last 3 results) No results for input(s): PROBNP in the last 8760 hours. CBG: No results for input(s): GLUCAP in the last 168 hours. D-Dimer: No results for input(s): DDIMER in the last 72 hours. Hgb  A1c: No results for input(s): HGBA1C in the last 72 hours. Lipid Profile: No results for input(s): CHOL, HDL, LDLCALC, TRIG, CHOLHDL, LDLDIRECT in the last 72 hours. Thyroid function studies: No results for input(s): TSH, T4TOTAL, T3FREE, THYROIDAB in the last 72 hours.  Invalid input(s): FREET3 Anemia work up: No results for input(s): VITAMINB12, FOLATE, FERRITIN, TIBC, IRON, RETICCTPCT in the last 72 hours. Sepsis Labs: Recent Labs  Lab 04/27/17 1430 04/29/17 0454  WBC 5.9 5.2   Microbiology No results found for this or any previous visit (from the past 240 hour(s)).   Medications:   . aspirin EC  81 mg Oral Daily  . enoxaparin (LOVENOX) injection  40 mg Subcutaneous Q24H  . QUEtiapine  100 mg Oral QHS  . topiramate  100 mg Oral QHS   Continuous Infusions:    LOS: 2 days   Charlynne Cousins  [facility] Pager [phone]  *Please refer to [email or web address], password [email or web address] to get updated schedule on who will round on this patient, as hospitalists switch teams weekly. If 7PM-7AM, please contact night-coverage at [email or web address], password [email or web address] for any overnight needs.  04/30/2017, 8:32 AM

## 2017-04-30 NOTE — Discharge Instructions (Signed)
Tracy Valenzuela was admitted to the Hospital on 04/27/2017 and Discharged on Discharge Date 04/30/2017 and should be excused from work/school   for 2   days starting tomorrow , may return to work/school without any restrictions.  Call Bess Harvest MD, [facility] [phone] with questions.  Charlynne Cousins M.D on 04/30/2017,at 2:32 PM  [facility] Office  [phone]

## 2017-04-30 NOTE — Progress Notes (Signed)
Patient transferred to [facility] for stress test by [facility]

## 2017-04-30 NOTE — Progress Notes (Signed)
Pt d/ced home with family member and sts she will f/u with her pcp tomorrow or thursday

## 2017-05-01 ENCOUNTER — Telehealth: Payer: Self-pay

## 2017-05-01 NOTE — Telephone Encounter (Signed)
LM requesting call back to complete TCM and confirm hospital f/u appt.

## 2017-05-01 NOTE — Discharge Summary (Signed)
Physician Discharge Summary  Tracy Valenzuela VEH:[id number] DOB: 1975/10/01 DOA: 04/27/2017  PCP: Brunetta Jeans, PA-C  Admit date: 04/27/2017 Discharge date: 05/01/2017  Admitted From: home Disposition:  Home  Recommendations for Outpatient Follow-up:  1. Follow up with PCP in 1-2 weeks   Home Health:No Equipment/Devices:None   Discharge Condition:stable CODE STATUS:full Diet recommendation: Heart Healthy   Brief/Interim Summary: 42 y.o. female with past medical history ofdiabetes, morbid obesity, smoking, bipolar disorder who presents with chest pain starting9am morning of admission. When she woke up she noted dizziness but went to work. 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 Soon afterwards she began to have left arm throbbing. The chest pain and arm pain came and went. She became lightheaded and sweaty while giving out the meds and eventually decided to come to the hospital. Nitro x 2 helped the chest discomfort   Discharge Diagnoses:  Principal Problem:   Chest pain Active Problems:   Bipolar 1 disorder, mixed, moderate (HCC)   Morbid obesity ([place])   Prediabetes   Nicotine abuse  Chest pain: 3 sets of cardiac biomarkers were negative, she was started on aspirin. She was scheduled for 2 days stress test this showed low risk for cardiac event. She was counseled about weight loss and an exercise regimen.  Bipolar disorder no changes were made.  Morbid obesity: She was counseled about weight loss.  Prediabetes: Encourage weight loss.  Nicotine abuse: Counseling.  Discharge Instructions  Discharge Instructions    Diet - low sodium heart healthy   Complete by:  As directed    Increase activity slowly   Complete by:  As directed      Allergies as of 04/30/2017      Reactions   Penicillins Hives   Has patient had a PCN  reaction causing immediate rash, facial/tongue/throat swelling, SOB or lightheadedness with hypotension:no Has patient had a PCN reaction causing severe rash involving mucus membranes or skin necrosis: Yes Has patient had a PCN reaction that required hospitalization No Has patient had a PCN reaction occurring within the last 10 years: No If all of the above answers are "NO", then may proceed with Cephalosporin use.      Medication List    STOP taking these medications   doxycycline 100 MG capsule Commonly known as:  VIBRAMYCIN     TAKE these medications   QUEtiapine 100 MG tablet Commonly known as:  SEROQUEL Take 1 tablet (100 mg total) by mouth at bedtime.   topiramate 50 MG tablet Commonly known as:  TOPAMAX Take 100 mg by mouth at bedtime. What changed:  Another medication with the same name was removed. Continue taking this medication, and follow the directions you see here.       Allergies  Allergen Reactions  . Penicillins Hives    Has patient had a PCN reaction causing immediate rash, facial/tongue/throat swelling, SOB or lightheadedness with hypotension:no Has patient had a PCN reaction causing severe rash involving mucus membranes or skin necrosis: Yes Has patient had a PCN reaction that required hospitalization No Has patient had a PCN reaction occurring within the last 10 years: No If all of the above answers are "NO", then may proceed with Cephalosporin use.     Consultations:  None   Procedures/Studies: Dg Chest 2 View  Result Date: 04/27/2017 CLINICAL DATA:  Left-sided chest pain EXAM: CHEST  2  VIEW COMPARISON:  08/11/2013 FINDINGS: Cardiac enlargement. Both lungs are clear. No pleural effusion or edema. Visualized skeletal structures are unremarkable. IMPRESSION: 1. No acute intracranial abnormalities. Electronically Signed   By: Kerby Moors M.D.   On: 04/27/2017 14:30   Nm Myocar Multi W/spect W/wall Motion / Ef  Result Date: 04/30/2017  The study  is normal. There are no perfusion defects consistent with prior infarct or current ischemia.  This is a low risk study.  The left ventricular ejection fraction is mildly decreased (45-54%). LVEF 50% low normal.      Subjective: No complaints.  Discharge Exam: Vitals:   04/30/17 0511 04/30/17 0834  BP: (!) 104/52 (!) 117/57  Pulse: 63 65  Resp: 20 18  Temp: 98 F (36.7 C) 98.3 F (36.8 C)  SpO2: 98% 98%   Vitals:   04/29/17 0502 04/29/17 2014 04/30/17 0511 04/30/17 0834  BP: (!) 119/56 (!) 112/42 (!) 104/52 (!) 117/57  Pulse: 71 68 63 65  Resp: 20 20 20 18   Temp: 97.8 F (36.6 C) 98.2 F (36.8 C) 98 F (36.7 C) 98.3 F (36.8 C)  TempSrc: Oral Oral Oral   SpO2: 100% 97% 98% 98%  Weight:      Height:        General: Pt is alert, awake, not in acute distress Cardiovascular: RRR, S1/S2 +, no rubs, no gallops Respiratory: CTA bilaterally, no wheezing, no rhonchi Abdominal: Soft, NT, ND, bowel sounds + Extremities: no edema, no cyanosis    The results of significant diagnostics from this hospitalization (including imaging, microbiology, ancillary and laboratory) are listed below for reference.     Microbiology: No results found for this or any previous visit (from the past 240 hour(s)).   Labs: BNP (last 3 results) No results for input(s): BNP in the last 8760 hours. Basic Metabolic Panel: Recent Labs  Lab 04/27/17 1430 04/29/17 0454  NA 139 137  K 3.6 3.7  CL 107 108  CO2 25 24  GLUCOSE 111* 114*  BUN 14 13  CREATININE 0.76 0.78  CALCIUM 9.2 8.8*   Liver Function Tests: No results for input(s): AST, ALT, ALKPHOS, BILITOT, PROT, ALBUMIN in the last 168 hours. No results for input(s): LIPASE, AMYLASE in the last 168 hours. No results for input(s): AMMONIA in the last 168 hours. CBC: Recent Labs  Lab 04/27/17 1430 04/29/17 0454  WBC 5.9 5.2  HGB 11.4* 10.9*  HCT 34.9* 33.9*  MCV 87.3 87.4  PLT 334 288   Cardiac Enzymes: Recent Labs  Lab  04/27/17 1800 04/28/17 0015 04/28/17 0644  TROPONINI <0.03 <0.03 <0.03   BNP: Invalid input(s): POCBNP CBG: No results for input(s): GLUCAP in the last 168 hours. D-Dimer No results for input(s): DDIMER in the last 72 hours. Hgb A1c No results for input(s): HGBA1C in the last 72 hours. Lipid Profile No results for input(s): CHOL, HDL, LDLCALC, TRIG, CHOLHDL, LDLDIRECT in the last 72 hours. Thyroid function studies No results for input(s): TSH, T4TOTAL, T3FREE, THYROIDAB in the last 72 hours.  Invalid input(s): FREET3 Anemia work up No results for input(s): VITAMINB12, FOLATE, FERRITIN, TIBC, IRON, RETICCTPCT in the last 72 hours. Urinalysis    Component Value Date/Time   COLORURINE YELLOW 04/02/2016 1257   APPEARANCEUR CLOUDY (A) 04/02/2016 1257   LABSPEC 1.025 04/02/2016 1257   PHURINE 6.0 04/02/2016 1257   GLUCOSEU NEGATIVE 04/02/2016 1257   HGBUR TRACE (A) 04/02/2016 1257   BILIRUBINUR NEGATIVE 04/02/2016 1257   [place] 04/02/2016 1257  PROTEINUR TRACE (A) 04/02/2016 1257   UROBILINOGEN 0.2 11/04/2014 1458   NITRITE NEGATIVE 04/02/2016 1257   LEUKOCYTESUR 1+ (A) 04/02/2016 1257   Sepsis Labs Invalid input(s): PROCALCITONIN,  WBC,  LACTICIDVEN Microbiology No results found for this or any previous visit (from the past 240 hour(s)).   Time coordinating discharge: Over 30 minutes  SIGNED:   Charlynne Cousins, MD  [facility] 05/01/2017, 11:27 AM Pager   If 7PM-7AM, please contact night-coverage [email or web address] Password [email or web address]

## 2017-05-02 NOTE — Telephone Encounter (Signed)
LM requesting call back to complete TCM and confirm hospital follow up appt.

## 2017-05-03 ENCOUNTER — Encounter: Payer: Self-pay | Admitting: Physician Assistant

## 2017-05-03 ENCOUNTER — Other Ambulatory Visit: Payer: Self-pay

## 2017-05-03 ENCOUNTER — Ambulatory Visit (INDEPENDENT_AMBULATORY_CARE_PROVIDER_SITE_OTHER): Payer: 59 | Admitting: Physician Assistant

## 2017-05-03 VITALS — BP 108/70 | HR 68 | Temp 98.0°F | Resp 16 | Ht 67.0 in | Wt 264.0 lb

## 2017-05-03 DIAGNOSIS — R5383 Other fatigue: Secondary | ICD-10-CM | POA: Diagnosis not present

## 2017-05-03 DIAGNOSIS — Z862 Personal history of diseases of the blood and blood-forming organs and certain disorders involving the immune mechanism: Secondary | ICD-10-CM | POA: Diagnosis not present

## 2017-05-03 DIAGNOSIS — R6889 Other general symptoms and signs: Secondary | ICD-10-CM

## 2017-05-03 DIAGNOSIS — R7303 Prediabetes: Secondary | ICD-10-CM | POA: Diagnosis not present

## 2017-05-03 LAB — CBC WITH DIFFERENTIAL/PLATELET
Basophils Absolute: 0 10*3/uL (ref 0.0–0.1)
Basophils Relative: 0.8 % (ref 0.0–3.0)
Eosinophils Absolute: 0.2 10*3/uL (ref 0.0–0.7)
Eosinophils Relative: 4.2 % (ref 0.0–5.0)
HCT: 35.5 % — ABNORMAL LOW (ref 36.0–46.0)
Hemoglobin: 11.6 g/dL — ABNORMAL LOW (ref 12.0–15.0)
Lymphocytes Relative: 32.7 % (ref 12.0–46.0)
Lymphs Abs: 1.3 10*3/uL (ref 0.7–4.0)
MCHC: 32.6 g/dL (ref 30.0–36.0)
MCV: 88.6 fl (ref 78.0–100.0)
Monocytes Absolute: 0.4 10*3/uL (ref 0.1–1.0)
Monocytes Relative: 8.6 % (ref 3.0–12.0)
Neutro Abs: 2.2 10*3/uL (ref 1.4–7.7)
Neutrophils Relative %: 53.7 % (ref 43.0–77.0)
Platelets: 304 10*3/uL (ref 150.0–400.0)
RBC: 4 Mil/uL (ref 3.87–5.11)
RDW: 13.6 % (ref 11.5–15.5)
WBC: 4.1 10*3/uL (ref 4.0–10.5)

## 2017-05-03 LAB — COMPREHENSIVE METABOLIC PANEL
ALT: 11 U/L (ref 0–35)
AST: 11 U/L (ref 0–37)
Albumin: 3.8 g/dL (ref 3.5–5.2)
Alkaline Phosphatase: 68 U/L (ref 39–117)
BUN: 12 mg/dL (ref 6–23)
CO2: 25 mEq/L (ref 19–32)
Calcium: 9.1 mg/dL (ref 8.4–10.5)
Chloride: 107 mEq/L (ref 96–112)
Creatinine, Ser: 0.78 mg/dL (ref 0.40–1.20)
GFR: 105.24 mL/min (ref >60.00)
Glucose, Bld: 91 mg/dL (ref 70–99)
Potassium: 4.1 mEq/L (ref 3.5–5.1)
Sodium: 138 mEq/L (ref 135–145)
Total Bilirubin: 0.3 mg/dL (ref 0.2–1.2)
Total Protein: 7.2 g/dL (ref 6.0–8.3)

## 2017-05-03 LAB — TSH: TSH: 1.57 u[IU]/mL (ref 0.35–4.50)

## 2017-05-03 LAB — IRON: Iron: 82 ug/dL (ref 42–145)

## 2017-05-03 LAB — B12 AND FOLATE PANEL
Folate: 8.3 ng/mL (ref >5.9)
Vitamin B-12: 372 pg/mL (ref 211–911)

## 2017-05-03 LAB — HEMOGLOBIN A1C: Hgb A1c MFr Bld: 6.2 % (ref 4.6–6.5)

## 2017-05-03 LAB — SEDIMENTATION RATE: Sed Rate: 22 mm/hr — ABNORMAL HIGH (ref 0–20)

## 2017-05-03 NOTE — Patient Instructions (Signed)
Please go to the lab today for blood work.  I will call you with your results. We will alter treatment regimen(s) as indicated by your results.    Please keep well-hydrated and get plenty of rest.  Continue chronic medications as directed.

## 2017-05-03 NOTE — Progress Notes (Signed)
Patient presents to clinic today for hospital follow-up. Patient presented to the ER on 04/27/17 with complaints of chest pressure and throbbing left arm pain noted while at work. Associated symptoms included dizziness. ER workup included EKG revealing sinus rhythm with t wave inversions, troponin x 3 (negative), CXR negative for acute cardiopulmonary findings. Patient subsequently admitted for observation and stress testing. Underwent nuclear stress test on 04/28/17 that was low risk. Started on Aspirin. Was subsequently discharged on 05/01/17 with instruction to follow-up with PCP in 1-2 weeks.  Since discharge, patient endorses doing well. Denies any recurrence of chest pressure. States she notes fatigue that has been going on since before hospitalization. Also noted ocassional butterfly-like rash of her face. Mother with Lupus and patient is concerned that she may have as well. Denies headache, SOB, lightheadedness/dizziness.   Past Medical History:  Diagnosis Date  . Anemia   . Anxiety   . ASCUS with positive high risk HPV 11/2014  . Asthma   . Bipolar 1 disorder ([place])   . GERD (gastroesophageal reflux disease)   . Heart murmur   . LGSIL (low grade squamous intraepithelial dysplasia) 11/2014   colposcopy biopsy.  recommend follow up pap in one year  . Pre-diabetes   . Prediabetes     Current Outpatient Medications on File Prior to Visit  Medication Sig Dispense Refill  . QUEtiapine (SEROQUEL) 100 MG tablet Take 1 tablet (100 mg total) by mouth at bedtime. 90 tablet 0  . topiramate (TOPAMAX) 50 MG tablet Take 100 mg by mouth at bedtime.     No current facility-administered medications on file prior to visit.     Allergies  Allergen Reactions  . Penicillins Hives    Has patient had a PCN reaction causing immediate rash, facial/tongue/throat swelling, SOB or lightheadedness with hypotension:no Has patient had a PCN reaction causing severe rash involving mucus membranes or skin  necrosis: Yes Has patient had a PCN reaction that required hospitalization No Has patient had a PCN reaction occurring within the last 10 years: No If all of the above answers are "NO", then may proceed with Cephalosporin use.     Family History  Problem Relation Age of Onset  . Lupus Mother   . Heart disease Mother   . Diabetes Maternal Grandmother   . Heart disease Maternal Grandmother   . Hypertension Maternal Grandmother   . Prostate cancer Maternal Grandfather   . Alzheimer's disease Paternal Grandfather   . Colon cancer Neg Hx   . Stomach cancer Neg Hx     Social History   Socioeconomic History  . Marital status: Legally Separated    Spouse name: None  . Number of children: 3  . Years of education: 42  . Highest education level: None  Social Needs  . Financial resource strain: None  . Food insecurity - worry: None  . Food insecurity - inability: None  . Transportation needs - medical: None  . Transportation needs - non-medical: None  Occupational History  . Occupation: Radio broadcast assistant  Tobacco Use  . Smoking status: Former Smoker    Types: Cigarettes  . Smokeless tobacco: Never Used  . Tobacco comment: not smoking at all  Substance and Sexual Activity  . Alcohol use: Yes    Alcohol/week: 0.0 oz  . Drug use: No  . Sexual activity: Yes    Birth control/protection: Surgical    Comment: intercourse age 71, sexual partners more than 5  Other Topics Concern  . None  Social History Narrative   Fun: Sleep    Review of Systems - See HPI.  All other ROS are negative.  BP 108/70   Pulse 68   Temp 98 F (36.7 C) (Oral)   Resp 16   Ht '5\' 7"'$  (1.702 m)   Wt 264 lb (119.7 kg)   LMP  (LMP Unknown) Comment: neg beta hcg 05-23-16  SpO2 100%   BMI 41.35 kg/m   Physical Exam  Constitutional: She is oriented to person, place, and time and well-developed, well-nourished, and in no distress.  HENT:  Head: Normocephalic and atraumatic.  Eyes: Conjunctivae are  normal.  Neck: Neck supple. No thyromegaly present.  Cardiovascular: Normal rate, regular rhythm, normal heart sounds and intact distal pulses.  Pulmonary/Chest: Effort normal and breath sounds normal. No respiratory distress. She has no wheezes. She has no rales. She exhibits no tenderness.  Lymphadenopathy:    She has no cervical adenopathy.  Neurological: She is alert and oriented to person, place, and time. No cranial nerve deficit.  Skin: Skin is warm and dry. No rash noted.  Psychiatric: Affect normal.  Vitals reviewed.  Recent Results (from the past 2160 hour(s))  Basic metabolic panel     Status: Abnormal   Collection Time: 04/27/17  2:30 PM  Result Value Ref Range   Sodium 139 135 - 145 mmol/L   Potassium 3.6 3.5 - 5.1 mmol/L   Chloride 107 101 - 111 mmol/L   CO2 25 22 - 32 mmol/L   Glucose, Bld 111 (H) 65 - 99 mg/dL   BUN 14 6 - 20 mg/dL   Creatinine, Ser 0.76 0.44 - 1.00 mg/dL   Calcium 9.2 8.9 - 10.3 mg/dL   GFR calc non Af Amer >60 >60 mL/min   GFR calc Af Amer >60 >60 mL/min    Comment: (NOTE) The eGFR has been calculated using the CKD EPI equation. This calculation has not been validated in all clinical situations. eGFR's persistently <60 mL/min signify possible Chronic Kidney Disease.    Anion gap 7 5 - 15  CBC     Status: Abnormal   Collection Time: 04/27/17  2:30 PM  Result Value Ref Range   WBC 5.9 4.0 - 10.5 K/uL   RBC 4.00 3.87 - 5.11 MIL/uL   Hemoglobin 11.4 (L) 12.0 - 15.0 g/dL   HCT 34.9 (L) 36.0 - 46.0 %   MCV 87.3 78.0 - 100.0 fL   MCH 28.5 26.0 - 34.0 pg   MCHC 32.7 30.0 - 36.0 g/dL   RDW 13.3 11.5 - 15.5 %   Platelets 334 150 - 400 K/uL  I-stat troponin, ED     Status: None   Collection Time: 04/27/17  2:47 PM  Result Value Ref Range   Troponin i, poc 0.00 0.00 - 0.08 ng/mL   Comment 3            Comment: Due to the release kinetics of cTnI, a negative result within the first hours of the onset of symptoms does not rule out myocardial  infarction with certainty. If myocardial infarction is still suspected, repeat the test at appropriate intervals.   I-Stat beta hCG blood, ED     Status: None   Collection Time: 04/27/17  2:48 PM  Result Value Ref Range   I-stat hCG, quantitative <5.0 <5 mIU/mL   Comment 3            Comment:   GEST. AGE      CONC.  (mIU/mL)   <=  1 WEEK        5 - 50     2 WEEKS       50 - 500     3 WEEKS       100 - 10,000     4 WEEKS     1,000 - 30,000        FEMALE AND NON-PREGNANT FEMALE:     LESS THAN 5 mIU/mL   HIV antibody (Routine Testing)     Status: None   Collection Time: 04/27/17  6:00 PM  Result Value Ref Range   HIV Screen 4th Generation wRfx Non Reactive Non Reactive    Comment: (NOTE) Performed At: [facility] [place], [place] [id number] Rush Farmer MD [id number]   Troponin I     Status: None   Collection Time: 04/27/17  6:00 PM  Result Value Ref Range   Troponin I <0.03 <0.03 ng/mL  Troponin I     Status: None   Collection Time: 04/28/17 12:15 AM  Result Value Ref Range   Troponin I <0.03 <0.03 ng/mL  Troponin I     Status: None   Collection Time: 04/28/17  6:44 AM  Result Value Ref Range   Troponin I <0.03 <0.03 ng/mL  ECHOCARDIOGRAM COMPLETE     Status: None   Collection Time: 04/28/17 10:09 AM  Result Value Ref Range   Weight 4,250.47 oz   Height 67 in   BP 115/64 mmHg  CBC     Status: Abnormal   Collection Time: 04/29/17  4:54 AM  Result Value Ref Range   WBC 5.2 4.0 - 10.5 K/uL   RBC 3.88 3.87 - 5.11 MIL/uL   Hemoglobin 10.9 (L) 12.0 - 15.0 g/dL   HCT 33.9 (L) 36.0 - 46.0 %   MCV 87.4 78.0 - 100.0 fL   MCH 28.1 26.0 - 34.0 pg   MCHC 32.2 30.0 - 36.0 g/dL   RDW 13.3 11.5 - 15.5 %   Platelets 288 150 - 400 K/uL  Basic metabolic panel     Status: Abnormal   Collection Time: 04/29/17  4:54 AM  Result Value Ref Range   Sodium 137 135 - 145 mmol/L   Potassium 3.7 3.5 - 5.1 mmol/L   Chloride 108 101 - 111 mmol/L   CO2 24 22 - 32  mmol/L   Glucose, Bld 114 (H) 65 - 99 mg/dL   BUN 13 6 - 20 mg/dL   Creatinine, Ser 0.78 0.44 - 1.00 mg/dL   Calcium 8.8 (L) 8.9 - 10.3 mg/dL   GFR calc non Af Amer >60 >60 mL/min   GFR calc Af Amer >60 >60 mL/min    Comment: (NOTE) The eGFR has been calculated using the CKD EPI equation. This calculation has not been validated in all clinical situations. eGFR's persistently <60 mL/min signify possible Chronic Kidney Disease.    Anion gap 5 5 - 15  NM Myocar Multi W/Spect W/Wall Motion / EF     Status: None   Collection Time: 04/30/17  9:56 AM  Result Value Ref Range   Rest HR 60 bpm   Rest BP 116/65 mmHg   Percent HR 61 %   Peak HR 111 bpm   Peak BP 144/72 mmHg   MPHR 181 bpm    Assessment/Plan: 1. Fatigue, unspecified type Exam unremarkable. Hospital workup including negative stress test. Will obtain lab workup. No murmur on exam. Vitals are stable today. If all are negative, we may want  to consider echo and/or sleep apnea assessment. - CBC with Differential/Platelet - Comprehensive metabolic panel - Sedimentation rate - Antinuclear Antib (ANA)  2. History of iron deficiency anemia Potentially as cause of #1. Will check iron and b12/folate panel. - B12 and Folate Panel - Iron  3. Forgetfulness ESR and RPR. MMSE with score of 30/30. - Sedimentation rate - RPR - TSH  4. Pre-diabetes Check A1C today. - Hemoglobin A1c   Leeanne Rio, [place]

## 2017-05-06 ENCOUNTER — Other Ambulatory Visit: Payer: Self-pay | Admitting: Physician Assistant

## 2017-05-06 ENCOUNTER — Encounter: Payer: Self-pay | Admitting: Physician Assistant

## 2017-05-06 LAB — ANA: Anti Nuclear Antibody(ANA): NEGATIVE

## 2017-05-06 LAB — RPR: RPR Ser Ql: NONREACTIVE

## 2017-05-06 MED ORDER — TOPIRAMATE 50 MG PO TABS: 100.0000 mg | ORAL_TABLET | Freq: Every day | ORAL | 5 refills | Status: DC

## 2017-05-08 ENCOUNTER — Telehealth: Payer: Self-pay | Admitting: Physician Assistant

## 2017-05-08 ENCOUNTER — Other Ambulatory Visit: Payer: Self-pay | Admitting: Physician Assistant

## 2017-05-08 DIAGNOSIS — R5383 Other fatigue: Secondary | ICD-10-CM

## 2017-05-08 NOTE — Telephone Encounter (Signed)
Note below transferred to the result note.

## 2017-05-08 NOTE — Telephone Encounter (Signed)
Patient returned call for lab results, results given per notes Tracy Valenzuela 05/07/17, patient verbalized understanding, lab appointment made for 05/09/17. Unable to chart in result note due to result note not routed to [facility].

## 2017-05-09 ENCOUNTER — Other Ambulatory Visit: Payer: 59

## 2017-06-04 ENCOUNTER — Encounter: Payer: Self-pay | Admitting: Physician Assistant

## 2017-06-06 ENCOUNTER — Other Ambulatory Visit (INDEPENDENT_AMBULATORY_CARE_PROVIDER_SITE_OTHER): Payer: 59

## 2017-06-06 DIAGNOSIS — R5383 Other fatigue: Secondary | ICD-10-CM

## 2017-06-06 LAB — VITAMIN D 25 HYDROXY (VIT D DEFICIENCY, FRACTURES): VITD: 6.61 ng/mL — ABNORMAL LOW (ref 30.00–100.00)

## 2017-06-06 LAB — HIGH SENSITIVITY CRP: CRP, High Sensitivity: 19.19 mg/L — ABNORMAL HIGH (ref 0.000–5.000)

## 2017-06-10 ENCOUNTER — Encounter: Payer: Self-pay | Admitting: Emergency Medicine

## 2017-06-10 ENCOUNTER — Other Ambulatory Visit: Payer: Self-pay | Admitting: Physician Assistant

## 2017-06-10 DIAGNOSIS — R7982 Elevated C-reactive protein (CRP): Secondary | ICD-10-CM

## 2017-06-10 DIAGNOSIS — E559 Vitamin D deficiency, unspecified: Secondary | ICD-10-CM

## 2017-06-10 MED ORDER — VITAMIN D (ERGOCALCIFEROL) 1.25 MG (50000 UNIT) PO CAPS: ORAL_CAPSULE | ORAL | 0 refills | Status: DC

## 2017-06-10 MED ORDER — VITAMIN D 1000 UNITS PO TABS: 1000.0000 [IU] | ORAL_TABLET | Freq: Every day | ORAL | 0 refills | Status: DC

## 2017-06-11 ENCOUNTER — Other Ambulatory Visit: Payer: Self-pay | Admitting: Physician Assistant

## 2017-06-11 DIAGNOSIS — R7982 Elevated C-reactive protein (CRP): Secondary | ICD-10-CM

## 2017-06-28 ENCOUNTER — Other Ambulatory Visit: Payer: Self-pay | Admitting: Family

## 2017-06-28 ENCOUNTER — Ambulatory Visit: Payer: Self-pay | Admitting: *Deleted

## 2017-06-28 ENCOUNTER — Other Ambulatory Visit: Payer: Self-pay

## 2017-06-28 ENCOUNTER — Ambulatory Visit: Payer: 59 | Admitting: Family Medicine

## 2017-06-28 ENCOUNTER — Encounter: Payer: Self-pay | Admitting: Family Medicine

## 2017-06-28 VITALS — BP 110/68 | HR 67 | Temp 99.3°F | Ht 67.0 in | Wt 269.0 lb

## 2017-06-28 DIAGNOSIS — B349 Viral infection, unspecified: Secondary | ICD-10-CM | POA: Diagnosis not present

## 2017-06-28 DIAGNOSIS — J029 Acute pharyngitis, unspecified: Secondary | ICD-10-CM | POA: Diagnosis not present

## 2017-06-28 DIAGNOSIS — F3162 Bipolar disorder, current episode mixed, moderate: Secondary | ICD-10-CM

## 2017-06-28 LAB — POC INFLUENZA A&B (BINAX/QUICKVUE)
Influenza A, POC: NEGATIVE
Influenza B, POC: NEGATIVE

## 2017-06-28 MED ORDER — OSELTAMIVIR PHOSPHATE 75 MG PO CAPS: 75.0000 mg | ORAL_CAPSULE | Freq: Two times a day (BID) | ORAL | 0 refills | Status: DC

## 2017-06-28 MED ORDER — GUAIFENESIN-CODEINE 100-10 MG/5ML PO SOLN: 5.0000 mL | Freq: Four times a day (QID) | ORAL | 0 refills | Status: DC | PRN

## 2017-06-28 NOTE — Telephone Encounter (Signed)
Patient phoned in with congestion, cough, ST, body aches and possible fever for 24 hours.  Reason for Disposition . Patient is HIGH RISK (e.g., age > 47 years, pregnant, HIV+, or chronic medical condition)  Answer Assessment - Initial Assessment Questions 1. WORST SYMPTOM: "What is your worst symptom?" (e.g., cough, runny nose, muscle aches, headache, sore throat, fever)      Congestion, yellowish thin mucous 2. ONSET: "When did your flu symptoms start?"     1 and 1/2 days ago 3. COUGH: "How bad is the cough?"    non productive cough 4. RESPIRATORY DISTRESS: "Describe your breathing."    normal 5. FEVER: "Do you have a fever?" If so, ask: "What is your temperature, how was it measured, and when did it start?"    Last night woke up sweaty after taking nightquil 6. EXPOSURE: "Were you exposed to someone with influenza?"      Doesn't know 7. FLU VACCINE: "Did you get a flu shot this year?"     yes 8. HIGH RISK DISEASE: "Do you any chronic medical problems?" (e.g., heart or lung disease, asthma, weak immune system, or other HIGH RISK conditions)    Has been diagnosed with Lupus 9. PREGNANCY: "Is there any chance you are pregnant?" "When was your last menstrual period?"   no 10. OTHER SYMPTOMS: "Do you have any other symptoms?"  (e.g., runny nose, muscle aches, headache, sore throat)      Body aches, sore throat  Protocols used: INFLUENZA - SEASONAL-A-AH

## 2017-06-28 NOTE — Progress Notes (Signed)
Subjective   CC:  Chief Complaint  Patient presents with  . Sore Throat    Patient states she  has body aches, chills and congestion x 2 days     HPI: Tracy Valenzuela is a 42 y.o. female who presents to the office today to address the problems listed above in the chief complaint.  Patient complains of flu like symptoms including myalgias, fever, ST, mild cough and some congestion. Sxs have been present for 2 days. She has tried to alleviate the sxs with over-the-counter medicines with mild relief. No high risk factors for influenza complications or high risk household contacts are present. However, she works at an assisted living center. Her sister had flu last week.  No SOB or CP are present. Taking in fluids adequately; decreased appetite bt no significant n/v/d. She has had the flu vaccine this season.  I reviewed the patients updated PMH, FH, and SocHx.    Patient Active Problem List   Diagnosis Date Noted  . Chest pain 04/27/2017  . Acute bacterial sinusitis 03/10/2017  . Leiomyoma 10/30/2016  . Prediabetes 08/21/2016  . Gastroesophageal reflux disease 08/21/2016  . Thornwaldt's cyst 05/29/2016  . Morbid obesity ([place]) 11/09/2015  . Bipolar 1 disorder, mixed, moderate ([place]) 09/30/2015  . Decreased attention Span 09/30/2015  . Iron deficiency anemia 10/19/2014  H Family History: Patient family history includes Alzheimer's disease in her paternal grandfather; Diabetes in her maternal grandmother; Heart disease in her maternal grandmother and mother; Hypertension in her maternal grandmother; Lupus in her mother; Prostate cancer in her maternal grandfather. Social History:  Patient  reports that she has quit smoking. Her smoking use included cigarettes. she has never used smokeless tobacco. She reports that she drinks alcohol. She reports that she does not use drugs.  Review of Systems: Constitutional: negative for fever or malaise Ophthalmic: negative for photophobia, double  vision or loss of vision Cardiovascular: negative for chest pain, dyspnea on exertion, or new LE swelling Respiratory: negative for SOB or persistent cough Gastrointestinal: negative for abdominal pain, change in bowel habits or melena Genitourinary: negative for dysuria or gross hematuria Musculoskeletal: negative for new gait disturbance or muscular weakness Integumentary: negative for new or persistent rashes Neurological: negative for TIA or stroke symptoms Psychiatric: negative for SI or delusions Allergic/Immunologic: negative for hives  Objective  Vitals: BP 110/68   Pulse 67   Temp 99.3 F (37.4 C)   Ht 5\' 7"  (1.702 m)   Wt 269 lb (122 kg)   LMP  (LMP Unknown) Comment: neg beta hcg 05-23-16  BMI 42.13 kg/m  General: no acute respiratory distress  Psych:  Alert and oriented, normal mood and affect HEENT: Normocephalic, nasal congestion present, TMs w/o erythema, OP with erythema w/o exudate, no LAD, supple neck  Cardiovascular:  RRR without murmur or gallop. no peripheral edema Respiratory:  Good breath sounds bilaterally, CTAB with normal respiratory effort Gastrointestinal: soft, flat abdomen, normal active bowel sounds, no palpable masses, no hepatosplenomegaly, no appreciated hernias Skin:  Warm, no rashes Neurologic:   Mental status is normal. normal gait Office Visit on 06/28/2017  Component Date Value Ref Range Status  . Influenza A, POC 06/28/2017 Negative  Negative Final  . Influenza B, POC 06/28/2017 Negative  Negative Final    Assessment  1. Sore throat   2. Viral syndrome      Plan   Due to recent exposure and classic sxs will treat as flu. tamiflu and cough meds ordered. Supportive care. No work  until afebrile x 24 hours. Work note given  Follow up: Return if symptoms worsen or fail to improve.    Commons side effects, risks, benefits, and alternatives for medications and treatment plan prescribed today were discussed, and the patient expressed  understanding of the given instructions. Patient is instructed to call or message via MyChart if he/she has any questions or concerns regarding our treatment plan. No barriers to understanding were identified. We discussed Red Flag symptoms and signs in detail. Patient expressed understanding regarding what to do in case of urgent or emergency type symptoms.   Medication list was reconciled, printed and provided to the patient in AVS. Patient instructions and summary information was reviewed with the patient as documented in the AVS. This note was prepared with assistance of Dragon voice recognition software. Occasional wrong-word or sound-a-like substitutions may have occurred due to the inherent limitations of voice recognition software  Orders Placed This Encounter  Procedures  . POC Influenza A&B(BINAX/QUICKVUE)   Meds ordered this encounter  Medications  . oseltamivir (TAMIFLU) 75 MG capsule    Sig: Take 1 capsule (75 mg total) by mouth 2 (two) times daily.    Dispense:  10 capsule    Refill:  0  . guaiFENesin-codeine 100-10 MG/5ML syrup    Sig: Take 5 mLs by mouth every 6 (six) hours as needed for cough.    Dispense:  120 mL    Refill:  0

## 2017-06-28 NOTE — Patient Instructions (Signed)
Please follow up if symptoms do not improve or as needed.   Influenza, Adult Influenza, more commonly known as "the flu," is a viral infection that primarily affects the respiratory tract. The respiratory tract includes organs that help you breathe, such as the lungs, nose, and throat. The flu causes many common cold symptoms, as well as a high fever and body aches. The flu spreads easily from person to person (is contagious). Getting a flu shot (influenza vaccination) every year is the best way to prevent influenza. What are the causes? Influenza is caused by a virus. You can catch the virus by:  Breathing in droplets from an infected person's cough or sneeze.  Touching something that was recently contaminated with the virus and then touching your mouth, nose, or eyes.  What increases the risk? The following factors may make you more likely to get the flu:  Not cleaning your hands frequently with soap and water or alcohol-based hand sanitizer.  Having close contact with many people during cold and flu season.  Touching your mouth, eyes, or nose without washing or sanitizing your hands first.  Not drinking enough fluids or not eating a healthy diet.  Not getting enough sleep or exercise.  Being under a high amount of stress.  Not getting a yearly (annual) flu shot.  You may be at a higher risk of complications from the flu, such as a severe lung infection (pneumonia), if you:  Are over the age of 36.  Are pregnant.  Have a weakened disease-fighting system (immune system). You may have a weakened immune system if you: ? Have HIV or AIDS. ? Are undergoing chemotherapy. ? Aretaking medicines that reduce the activity of (suppress) the immune system.  Have a long-term (chronic) illness, such as heart disease, kidney disease, diabetes, or lung disease.  Have a liver disorder.  Are obese.  Have anemia.  What are the signs or symptoms? Symptoms of this condition typically  last 4-10 days and may include:  Fever.  Chills.  Headache, body aches, or muscle aches.  Sore throat.  Cough.  Runny or congested nose.  Chest discomfort and cough.  Poor appetite.  Weakness or tiredness (fatigue).  Dizziness.  Nausea or vomiting.  How is this diagnosed? This condition may be diagnosed based on your medical history and a physical exam. Your health care provider may do a nose or throat swab test to confirm the diagnosis. How is this treated? If influenza is detected early, you can be treated with antiviral medicine that can reduce the length of your illness and the severity of your symptoms. This medicine may be given by mouth (orally) or through an IV tube that is inserted in one of your veins. The goal of treatment is to relieve symptoms by taking care of yourself at home. This may include taking over-the-counter medicines, drinking plenty of fluids, and adding humidity to the air in your home. In some cases, influenza goes away on its own. Severe influenza or complications from influenza may be treated in a hospital. Follow these instructions at home:  Take over-the-counter and prescription medicines only as told by your health care provider.  Use a cool mist humidifier to add humidity to the air in your home. This can make breathing easier.  Rest as needed.  Drink enough fluid to keep your urine clear or pale yellow.  Cover your mouth and nose when you cough or sneeze.  Wash your hands with soap and water often, especially after you  cough or sneeze. If soap and water are not available, use hand sanitizer.  Stay home from work or school as told by your health care provider. Unless you are visiting your health care provider, try to avoid leaving home until your fever has been gone for 24 hours without the use of medicine.  Keep all follow-up visits as told by your health care provider. This is important. How is this prevented?  Getting an annual flu  shot is the best way to avoid getting the flu. You may get the flu shot in late summer, fall, or winter. Ask your health care provider when you should get your flu shot.  Wash your hands often or use hand sanitizer often.  Avoid contact with people who are sick during cold and flu season.  Eat a healthy diet, drink plenty of fluids, get enough sleep, and exercise regularly. Contact a health care provider if:  You develop new symptoms.  You have: ? Chest pain. ? Diarrhea. ? A fever.  Your cough gets worse.  You produce more mucus.  You feel nauseous or you vomit. Get help right away if:  You develop shortness of breath or difficulty breathing.  Your skin or nails turn a bluish color.  You have severe pain or stiffness in your neck.  You develop a sudden headache or sudden pain in your face or ear.  You cannot stop vomiting. This information is not intended to replace advice given to you by your health care provider. Make sure you discuss any questions you have with your health care provider. Document Released: 04/13/2000 Document Revised: 09/22/2015 Document Reviewed: 02/08/2015 Elsevier Interactive Patient Education  2017 [employer].

## 2017-07-08 ENCOUNTER — Ambulatory Visit: Payer: Self-pay | Admitting: Psychology

## 2017-08-02 NOTE — Progress Notes (Signed)
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   History of Present Illness: Tracy Valenzuela is a 42 y.o. female seen in consultation per request of her PCP.  According to patient her symptoms that started about a year ago with generalized pain and discomfort in her body.  She states she is to have excessive bleeding and she underwent hysterectomy in July 2018.  Despite of that the generalized pain, fatigue persists.  She had labs which were unremarkable except for elevated CRP and sed rate.  She continues to have pain in all of her joints and muscles.  She describes pain in her bilateral hands and her knee joints.  She notices swelling in her hands and knees at times.  She also experiences numbness in her extremities.  Activities of Daily Living:  Patient reports morning stiffness for 2 minutes.   Patient Reports nocturnal pain.  Difficulty dressing/grooming: Denies Difficulty climbing stairs: Reports Difficulty getting out of chair: Reports Difficulty using hands for taps, buttons, cutlery, and/or writing: Denies   Review of Systems  Constitutional: Positive for fatigue. Negative for night sweats, weight gain and weight loss.  HENT: Positive for mouth dryness. Negative for mouth sores, trouble swallowing, trouble swallowing and nose dryness.   Eyes: Negative for pain, redness, visual disturbance and dryness.  Respiratory: Negative for cough, shortness of breath and difficulty breathing.   Cardiovascular: Negative for chest pain, palpitations, hypertension, irregular heartbeat and swelling in legs/feet.  Gastrointestinal: Positive for constipation and diarrhea. Negative for blood in stool.  Endocrine: Negative for  increased urination.  Genitourinary: Negative for vaginal dryness.  Musculoskeletal: Positive for arthralgias, joint pain, joint swelling, myalgias, morning stiffness and myalgias. Negative for muscle weakness and muscle tenderness.  Skin: Negative for color change, rash, hair loss, skin tightness, ulcers and sensitivity to sunlight.  Allergic/Immunologic: Negative for susceptible to infections.  Neurological: Negative for dizziness, memory loss, night sweats and weakness.  Hematological: Negative for swollen glands.  Psychiatric/Behavioral: Positive for depressed mood and sleep disturbance. The patient is nervous/anxious.     PMFS History:  Patient Active Problem List   Diagnosis Date Noted  . Chest pain 04/27/2017  . Acute bacterial sinusitis 03/10/2017  . Leiomyoma 10/30/2016  . Prediabetes 08/21/2016  . Gastroesophageal reflux disease 08/21/2016  . Thornwaldt's cyst 05/29/2016  . Morbid obesity ([place]) 11/09/2015  . Bipolar 1 disorder, mixed, moderate ([place]) 09/30/2015  . Decreased attention Span 09/30/2015  . Iron deficiency anemia 10/19/2014    Past Medical History:  Diagnosis Date  . Anemia   . Anxiety   . ASCUS with positive high risk HPV 11/2014  . Asthma   . Bipolar 1 disorder ([place])   . GERD (gastroesophageal reflux disease)   . Heart murmur   . LGSIL (low grade squamous intraepithelial dysplasia) 11/2014   colposcopy biopsy.  recommend follow up pap in one year  . Pre-diabetes   . Prediabetes     Family History  Problem Relation Age of Onset  . Lupus Mother   . Heart disease Mother   . Heart disease Father   . Diabetes Maternal  Grandmother   . Heart disease Maternal Grandmother   . Hypertension Maternal Grandmother   . Prostate cancer Maternal Grandfather   . Alzheimer's disease Paternal Grandfather   . ADD / ADHD Son   . Colon cancer Neg Hx   . Stomach cancer Neg Hx    Past Surgical History:  Procedure Laterality Date  . CARDIAC CATHETERIZATION    .  CYSTOSCOPY  10/30/2016   Procedure: CYSTOSCOPY;  Surgeon: Anastasio Auerbach, MD;  Location: [facility];  Service: Gynecology;;  . LAPAROSCOPIC VAGINAL HYSTERECTOMY WITH SALPINGECTOMY Bilateral 10/30/2016   Procedure: LAPAROSCOPIC ASSISTED VAGINAL HYSTERECTOMY WITH SALPINGECTOMY;  Surgeon: Anastasio Auerbach, MD;  Location: [facility];  Service: Gynecology;  Laterality: Bilateral;  . TUBAL LIGATION     Social History   Social History Narrative   Fun: Sleep      Objective: Vital Signs: BP 124/78 (BP Location: Left Arm, Patient Position: Sitting, Cuff Size: Large)   Pulse 81   Resp 16   Ht 5\' 7"  (1.702 m)   Wt 267 lb (121.1 kg)   LMP  (LMP Unknown)   BMI 41.82 kg/m    Physical Exam  Constitutional: She is oriented to person, place, and time. She appears well-developed and well-nourished.  HENT:  Head: Normocephalic and atraumatic.  Eyes: Conjunctivae and EOM are normal.  Neck: Normal range of motion.  Cardiovascular: Normal rate, regular rhythm, normal heart sounds and intact distal pulses.  Pulmonary/Chest: Effort normal and breath sounds normal.  Abdominal: Soft. Bowel sounds are normal.  Lymphadenopathy:    She has no cervical adenopathy.  Neurological: She is alert and oriented to person, place, and time.  Skin: Skin is warm and dry. Capillary refill takes less than 2 seconds.  Psychiatric: She has a normal mood and affect. Her behavior is normal.  Nursing note and vitals reviewed.    Musculoskeletal Exam: C-spine thoracic lumbar spine good range of motion.  Shoulder joints elbows joints wrist joint MCPs PIPs DIPs were in good range of motion with no synovitis.  Hip joints knee joints ankles MTPs PIPs DIPs were in good range of motion with no synovitis.  She had generalized hyperalgesia and positive tender points.  She has bilateral trapezius spasm tenderness over bilateral lateral epicondyle area over SI joint area bilateral trochanteric area medial aspect of her knee  joints.    CDAI Exam: No CDAI exam completed.    Investigation: No additional findings.  Component     Latest Ref Rng & Units 05/03/2017 06/06/2017  Vitamin B12     211 - 911 pg/mL 372   Folate     >5.9 ng/mL 8.3   Sed Rate     0 - 20 mm/hr 22 (H)   Anit Nuclear Antibody(ANA)     NEGATIVE NEGATIVE   RPR     NON-REACTI NON-REACTIVE   TSH     0.35 - 4.50 uIU/mL 1.57   Iron     42 - 145 ug/dL 82   C-Reactive Protein, Cardiac     0.000 - 5.000 mg/L  19.190 (H)   CBC Latest Ref Rng & Units 05/03/2017 04/29/2017 04/27/2017  WBC 4.0 - 10.5 K/uL 4.1 5.2 5.9  Hemoglobin 12.0 - 15.0 g/dL 11.6(L) 10.9(L) 11.4(L)  Hematocrit 36.0 - 46.0 % 35.5(L) 33.9(L) 34.9(L)  Platelets 150.0 - 400.0 K/uL 304.0 288 334   CMP Latest Ref Rng & Units 05/03/2017 04/29/2017 04/27/2017  Glucose 70 - 99 mg/dL 91 114(H) 111(H)  BUN 6 - 23 mg/dL  12 13 14   Creatinine 0.40 - 1.20 mg/dL 0.78 0.78 0.76  Sodium 135 - 145 mEq/L 138 137 139  Potassium 3.5 - 5.1 mEq/L 4.1 3.7 3.6  Chloride 96 - 112 mEq/L 107 108 107  CO2 19 - 32 mEq/L 25 24 25   Calcium 8.4 - 10.5 mg/dL 9.1 8.8(L) 9.2  Total Protein 6.0 - 8.3 g/dL 7.2 - -  Total Bilirubin 0.2 - 1.2 mg/dL 0.3 - -  Alkaline Phos 39 - 117 U/L 68 - -  AST 0 - 37 U/L 11 - -  ALT 0 - 35 U/L 11 - -    Imaging: No results found.  Speciality Comments: No specialty comments available.    Procedures:  No procedures performed Allergies: Penicillins   Assessment / Plan:     Visit Diagnoses: Elevated C-reactive protein (CRP) - CRP 19.  Uncertain etiology.  Myalgia: Possible myofascial pain syndrome.  She continues to have generalized pain fatigue and hyperalgesia she also has some positive tender points.  She may benefit from good sleep hygiene and exercise and medications like Cymbalta and muscle relaxers.  She may  discuss that further with her PCP.  Other fatigue -she has ongoing fatigue for almost a year now.  I will obtain following labs.  Plan: Urinalysis,  Routine w reflex microscopic, VITAMIN D 25 Hydroxy (Vit-D Deficiency, Fractures), CK, Serum protein electrophoresis with reflex  Pain in both hands: She complains of pain in her hands and intermittent swelling.  No synovitis was noted.  I will obtain x-ray of bilateral hands 2 views today. Plan: XR Hand 2 View Right, XR Hand 2 View Left, x-ray of bilateral hands were unremarkable.  Rheumatoid factor, Cyclic citrul peptide antibody, IgG, Sedimentation rate, Uric acid  Chronic pain of both knees: She continues to have pain and discomfort in her bilateral knee joints and difficulty walking.  No warmth swelling or effusion was noted.Plan: XR KNEE 3 VIEW RIGHT, XR KNEE 3 VIEW LEFT, x-ray showed moderate medial compartment narrowing and mild patellofemoral narrowing.  Angiotensin converting enzyme.  A handout on knee joint exercises was given.  Vitamin D deficiency: I will check vitamin D level again today.  Prediabetes  Bipolar 1 disorder, mixed, moderate ([facility])  History of asthma  History of gastroesophageal reflux (GERD)  Family history of systemic lupus erythematosus (SLE) in mother   Orders: Orders Placed This Encounter  Procedures  . XR KNEE 3 VIEW RIGHT  . XR KNEE 3 VIEW LEFT  . XR Hand 2 View Right  . XR Hand 2 View Left  . Urinalysis, Routine w reflex microscopic  . VITAMIN D 25 Hydroxy (Vit-D Deficiency, Fractures)  . CK  . Rheumatoid factor  . Cyclic citrul peptide antibody, IgG  . Sedimentation rate  . Serum protein electrophoresis with reflex  . Uric acid  . Angiotensin converting enzyme   No orders of the defined types were placed in this encounter.   Face-to-face time spent with patient was 50 minutes.  Greater than 50% of time was spent in counseling and coordination of care.  Follow-Up Instructions: Return for Fatigue, myalgia.   Bo Merino, MD  Note - This record has been created using Editor, commissioning.  Chart creation errors have been sought, but may  not always  have been located. Such creation errors do not reflect on  the standard of medical care.

## 2017-08-15 ENCOUNTER — Ambulatory Visit: Payer: 59 | Admitting: Rheumatology

## 2017-08-15 ENCOUNTER — Ambulatory Visit (INDEPENDENT_AMBULATORY_CARE_PROVIDER_SITE_OTHER): Payer: Self-pay

## 2017-08-15 ENCOUNTER — Encounter: Payer: Self-pay | Admitting: Rheumatology

## 2017-08-15 VITALS — BP 124/78 | HR 81 | Resp 16 | Ht 67.0 in | Wt 267.0 lb

## 2017-08-15 DIAGNOSIS — F3162 Bipolar disorder, current episode mixed, moderate: Secondary | ICD-10-CM | POA: Diagnosis not present

## 2017-08-15 DIAGNOSIS — M79642 Pain in left hand: Secondary | ICD-10-CM | POA: Diagnosis not present

## 2017-08-15 DIAGNOSIS — Z8719 Personal history of other diseases of the digestive system: Secondary | ICD-10-CM

## 2017-08-15 DIAGNOSIS — R5383 Other fatigue: Secondary | ICD-10-CM | POA: Diagnosis not present

## 2017-08-15 DIAGNOSIS — G8929 Other chronic pain: Secondary | ICD-10-CM

## 2017-08-15 DIAGNOSIS — M25562 Pain in left knee: Secondary | ICD-10-CM | POA: Diagnosis not present

## 2017-08-15 DIAGNOSIS — Z8269 Family history of other diseases of the musculoskeletal system and connective tissue: Secondary | ICD-10-CM

## 2017-08-15 DIAGNOSIS — E559 Vitamin D deficiency, unspecified: Secondary | ICD-10-CM | POA: Diagnosis not present

## 2017-08-15 DIAGNOSIS — M79641 Pain in right hand: Secondary | ICD-10-CM | POA: Diagnosis not present

## 2017-08-15 DIAGNOSIS — Z8709 Personal history of other diseases of the respiratory system: Secondary | ICD-10-CM

## 2017-08-15 DIAGNOSIS — R7982 Elevated C-reactive protein (CRP): Secondary | ICD-10-CM | POA: Diagnosis not present

## 2017-08-15 DIAGNOSIS — R7303 Prediabetes: Secondary | ICD-10-CM | POA: Diagnosis not present

## 2017-08-15 DIAGNOSIS — M25561 Pain in right knee: Secondary | ICD-10-CM

## 2017-08-15 NOTE — Patient Instructions (Signed)
Hand Exercises Hand exercises can be helpful to almost anyone. These exercises can strengthen the hands, improve flexibility and movement, and increase blood flow to the hands. These results can make work and daily tasks easier. Hand exercises can be especially helpful for people who have joint pain from arthritis or have nerve damage from overuse (carpal tunnel syndrome). These exercises can also help people who have injured a hand. Most of these hand exercises are fairly gentle stretching routines. You can do them often throughout the day. Still, it is a good idea to ask your health care provider which exercises would be best for you. Warming your hands before exercise may help to reduce stiffness. You can do this with gentle massage or by placing your hands in warm water for 15 minutes. Also, make sure you pay attention to your level of hand pain as you begin an exercise routine. Exercises Knuckle Bend Repeat this exercise 5-10 times with each hand. 1. Stand or sit with your arm, hand, and all five fingers pointed straight up. Make sure your wrist is straight. 2. Gently and slowly bend your fingers down and inward until the tips of your fingers are touching the tops of your palm. 3. Hold this position for a few seconds. 4. Extend your fingers out to their original position, all pointing straight up again.  Finger Fan Repeat this exercise 5-10 times with each hand. 1. Hold your arm and hand out in front of you. Keep your wrist straight. 2. Squeeze your hand into a fist. 3. Hold this position for a few seconds. 4. Fan out, or spread apart, your hand and fingers as much as possible, stretching every joint fully.  Tabletop Repeat this exercise 5-10 times with each hand. 1. Stand or sit with your arm, hand, and all five fingers pointed straight up. Make sure your wrist is straight. 2. Gently and slowly bend your fingers at the knuckles where they meet the hand until your hand is making an  upside-down L shape. Your fingers should form a tabletop. 3. Hold this position for a few seconds. 4. Extend your fingers out to their original position, all pointing straight up again.  Making Os Repeat this exercise 5-10 times with each hand. 1. Stand or sit with your arm, hand, and all five fingers pointed straight up. Make sure your wrist is straight. 2. Make an O shape by touching your pointer finger to your thumb. Hold for a few seconds. Then open your hand wide. 3. Repeat this motion with each finger on your hand.  Table Spread Repeat this exercise 5-10 times with each hand. 1. Place your hand on a table with your palm facing down. Make sure your wrist is straight. 2. Spread your fingers out as much as possible. Hold this position for a few seconds. 3. Slide your fingers back together again. Hold for a few seconds.  Ball Grip  Repeat this exercise 10-15 times with each hand. 1. Hold a tennis ball or another soft ball in your hand. 2. While slowly increasing pressure, squeeze the ball as hard as possible. 3. Squeeze as hard as you can for 3-5 seconds. 4. Relax and repeat.  Wrist Curls Repeat this exercise 10-15 times with each hand. 1. Sit in a chair that has armrests. 2. Hold a light weight in your hand, such as a dumbbell that weighs 1-3 pounds (0.5-1.4 kg). Ask your health care provider what weight would be best for you. 3. Rest your hand just over   the end of the chair arm with your palm facing up. 4. Gently pivot your wrist up and down while holding the weight. Do not twist your wrist from side to side.  Contact a health care provider if:  Your hand pain or discomfort gets much worse when you do an exercise.  Your hand pain or discomfort does not improve within 2 hours after you exercise. If you have any of these problems, stop doing these exercises right away. Do not do them again unless your health care provider says that you can. Get help right away if:  You  develop sudden, severe hand pain. If this happens, stop doing these exercises right away. Do not do them again unless your health care provider says that you can. This information is not intended to replace advice given to you by your health care provider. Make sure you discuss any questions you have with your health care provider. Document Released: 03/28/2015 Document Revised: 09/22/2015 Document Reviewed: 10/25/2014 Elsevier Interactive Patient Education  2018 [employer] Knee Exercises Ask your health care provider which exercises are safe for you. Do exercises exactly as told by your health care provider and adjust them as directed. It is normal to feel mild stretching, pulling, tightness, or discomfort as you do these exercises, but you should stop right away if you feel sudden pain or your pain gets worse.Do not begin these exercises until told by your health care provider. STRETCHING AND RANGE OF MOTION EXERCISES These exercises warm up your muscles and joints and improve the movement and flexibility of your knee. These exercises also help to relieve pain, numbness, and tingling. Exercise A: Knee Extension, Prone 1. Lie on your abdomen on a bed. 2. Place your left / right knee just beyond the edge of the surface so your knee is not on the bed. You can put a towel under your left / right thigh just above your knee for comfort. 3. Relax your leg muscles and allow gravity to straighten your knee. You should feel a stretch behind your left / right knee. 4. Hold this position for __________ seconds. 5. Scoot up so your knee is supported between repetitions. Repeat __________ times. Complete this stretch __________ times a day. Exercise B: Knee Flexion, Active  1. Lie on your back with both knees straight. If this causes back discomfort, bend your left / right knee so your foot is flat on the floor. 2. Slowly slide your left / right heel back toward your buttocks until you feel a gentle  stretch in the front of your knee or thigh. 3. Hold this position for __________ seconds. 4. Slowly slide your left / right heel back to the starting position. Repeat __________ times. Complete this exercise __________ times a day. Exercise C: Quadriceps, Prone  1. Lie on your abdomen on a firm surface, such as a bed or padded floor. 2. Bend your left / right knee and hold your ankle. If you cannot reach your ankle or pant leg, loop a belt around your foot and grab the belt instead. 3. Gently pull your heel toward your buttocks. Your knee should not slide out to the side. You should feel a stretch in the front of your thigh and knee. 4. Hold this position for __________ seconds. Repeat __________ times. Complete this stretch __________ times a day. Exercise D: Hamstring, Supine 1. Lie on your back. 2. Loop a belt or towel over the ball of your left / right foot. The ball of   your foot is on the walking surface, right under your toes. 3. Straighten your left / right knee and slowly pull on the belt to raise your leg until you feel a gentle stretch behind your knee. ? Do not let your left / right knee bend while you do this. ? Keep your other leg flat on the floor. 4. Hold this position for __________ seconds. Repeat __________ times. Complete this stretch __________ times a day. STRENGTHENING EXERCISES These exercises build strength and endurance in your knee. Endurance is the ability to use your muscles for a long time, even after they get tired. Exercise E: Quadriceps, Isometric  1. Lie on your back with your left / right leg extended and your other knee bent. Put a rolled towel or small pillow under your knee if told by your health care provider. 2. Slowly tense the muscles in the front of your left / right thigh. You should see your kneecap slide up toward your hip or see increased dimpling just above the knee. This motion will push the back of the knee toward the floor. 3. For __________  seconds, keep the muscle as tight as you can without increasing your pain. 4. Relax the muscles slowly and completely. Repeat __________ times. Complete this exercise __________ times a day. Exercise F: Straight Leg Raises - Quadriceps 1. Lie on your back with your left / right leg extended and your other knee bent. 2. Tense the muscles in the front of your left / right thigh. You should see your kneecap slide up or see increased dimpling just above the knee. Your thigh may even shake a bit. 3. Keep these muscles tight as you raise your leg 4-6 inches (10-15 cm) off the floor. Do not let your knee bend. 4. Hold this position for __________ seconds. 5. Keep these muscles tense as you lower your leg. 6. Relax your muscles slowly and completely after each repetition. Repeat __________ times. Complete this exercise __________ times a day. Exercise G: Hamstring, Isometric 1. Lie on your back on a firm surface. 2. Bend your left / right knee approximately __________ degrees. 3. Dig your left / right heel into the surface as if you are trying to pull it toward your buttocks. Tighten the muscles in the back of your thighs to dig as hard as you can without increasing any pain. 4. Hold this position for __________ seconds. 5. Release the tension gradually and allow your muscles to relax completely for __________ seconds after each repetition. Repeat __________ times. Complete this exercise __________ times a day. Exercise H: Hamstring Curls  If told by your health care provider, do this exercise while wearing ankle weights. Begin with __________ weights. Then increase the weight by 1 lb (0.5 kg) increments. Do not wear ankle weights that are more than __________. 1. Lie on your abdomen with your legs straight. 2. Bend your left / right knee as far as you can without feeling pain. Keep your hips flat against the floor. 3. Hold this position for __________ seconds. 4. Slowly lower your leg to the  starting position.  Repeat __________ times. Complete this exercise __________ times a day. Exercise I: Squats (Quadriceps) 1. Stand in front of a table, with your feet and knees pointing straight ahead. You may rest your hands on the table for balance but not for support. 2. Slowly bend your knees and lower your hips like you are going to sit in a chair. ? Keep your weight over your heels,   not over your toes. ? Keep your lower legs upright so they are parallel with the table legs. ? Do not let your hips go lower than your knees. ? Do not bend lower than told by your health care provider. ? If your knee pain increases, do not bend as low. 3. Hold the squat position for __________ seconds. 4. Slowly push with your legs to return to standing. Do not use your hands to pull yourself to standing. Repeat __________ times. Complete this exercise __________ times a day. Exercise J: Wall Slides (Quadriceps)  1. Lean your back against a smooth wall or door while you walk your feet out 18-24 inches (46-61 cm) from it. 2. Place your feet hip-width apart. 3. Slowly slide down the wall or door until your knees bend __________ degrees. Keep your knees over your heels, not over your toes. Keep your knees in line with your hips. 4. Hold for __________ seconds. Repeat __________ times. Complete this exercise __________ times a day. Exercise K: Straight Leg Raises - Hip Abductors 1. Lie on your side with your left / right leg in the top position. Lie so your head, shoulder, knee, and hip line up. You may bend your bottom knee to help you keep your balance. 2. Roll your hips slightly forward so your hips are stacked directly over each other and your left / right knee is facing forward. 3. Leading with your heel, lift your top leg 4-6 inches (10-15 cm). You should feel the muscles in your outer hip lifting. ? Do not let your foot drift forward. ? Do not let your knee roll toward the ceiling. 4. Hold this  position for __________ seconds. 5. Slowly return your leg to the starting position. 6. Let your muscles relax completely after each repetition. Repeat __________ times. Complete this exercise __________ times a day. Exercise L: Straight Leg Raises - Hip Extensors 1. Lie on your abdomen on a firm surface. You can put a pillow under your hips if that is more comfortable. 2. Tense the muscles in your buttocks and lift your left / right leg about 4-6 inches (10-15 cm). Keep your knee straight as you lift your leg. 3. Hold this position for __________ seconds. 4. Slowly lower your leg to the starting position. 5. Let your leg relax completely after each repetition. Repeat __________ times. Complete this exercise __________ times a day. This information is not intended to replace advice given to you by your health care provider. Make sure you discuss any questions you have with your health care provider. Document Released: 02/28/2005 Document Revised: 01/09/2016 Document Reviewed: 02/20/2015 Elsevier Interactive Patient Education  2018 [employer]  

## 2017-08-19 ENCOUNTER — Telehealth: Payer: Self-pay | Admitting: *Deleted

## 2017-08-19 DIAGNOSIS — E559 Vitamin D deficiency, unspecified: Secondary | ICD-10-CM

## 2017-08-19 LAB — PROTEIN ELECTROPHORESIS, SERUM, WITH REFLEX
Albumin ELP: 4.1 g/dL (ref 3.8–4.8)
Alpha 1: 0.3 g/dL (ref 0.2–0.3)
Alpha 2: 0.8 g/dL (ref 0.5–0.9)
Beta 2: 0.5 g/dL (ref 0.2–0.5)
Beta Globulin: 0.4 g/dL (ref 0.4–0.6)
Gamma Globulin: 1.7 g/dL (ref 0.8–1.7)
Total Protein: 7.8 g/dL (ref 6.1–8.1)

## 2017-08-19 LAB — URIC ACID: Uric Acid, Serum: 5.7 mg/dL (ref 2.5–7.0)

## 2017-08-19 LAB — URINALYSIS, ROUTINE W REFLEX MICROSCOPIC
Bilirubin Urine: NEGATIVE
Glucose, UA: NEGATIVE
Hgb urine dipstick: NEGATIVE
Ketones, ur: NEGATIVE
Leukocytes, UA: NEGATIVE
Nitrite: NEGATIVE
Protein, ur: NEGATIVE
Specific Gravity, Urine: 1.025 (ref 1.001–1.03)
pH: 5.5 (ref 5.0–8.0)

## 2017-08-19 LAB — ALDOLASE

## 2017-08-19 LAB — ANGIOTENSIN CONVERTING ENZYME: Angiotensin-Converting Enzyme: 52 U/L (ref 9–67)

## 2017-08-19 LAB — CK: Total CK: 158 U/L — ABNORMAL HIGH (ref 29–143)

## 2017-08-19 LAB — SEDIMENTATION RATE: Sed Rate: 19 mm/h (ref 0–20)

## 2017-08-19 LAB — VITAMIN D 25 HYDROXY (VIT D DEFICIENCY, FRACTURES): Vit D, 25-Hydroxy: 17 ng/mL — ABNORMAL LOW (ref 30–100)

## 2017-08-19 LAB — RHEUMATOID FACTOR: Rhuematoid fact SerPl-aCnc: <14 IU/mL (ref <14)

## 2017-08-19 LAB — CYCLIC CITRUL PEPTIDE ANTIBODY, IGG: Cyclic Citrullin Peptide Ab: <16 UNITS

## 2017-08-19 MED ORDER — VITAMIN D (ERGOCALCIFEROL) 1.25 MG (50000 UNIT) PO CAPS: 50000.0000 [IU] | ORAL_CAPSULE | ORAL | 0 refills | Status: DC

## 2017-08-19 NOTE — Progress Notes (Signed)
Please add aldolase

## 2017-08-19 NOTE — Telephone Encounter (Signed)
-----   Message from Ofilia Neas, PA-C sent at 08/19/2017  8:14 AM EDT ----- Vitamin D is low.  Please send in vitamin D 50,000 units by mouth twice weekly for 3 months.  We will recheck vitamin D in 3 months.

## 2017-08-20 NOTE — Progress Notes (Signed)
Vitamin D deficiency.  Please call in vitamin D 50,000 units twice a week for 3 months.  Recheck vitamin D in 3 months.

## 2017-09-27 DIAGNOSIS — M7918 Myalgia, other site: Secondary | ICD-10-CM

## 2017-09-27 DIAGNOSIS — E559 Vitamin D deficiency, unspecified: Secondary | ICD-10-CM | POA: Insufficient documentation

## 2017-09-27 DIAGNOSIS — M170 Bilateral primary osteoarthritis of knee: Secondary | ICD-10-CM | POA: Insufficient documentation

## 2017-09-27 DIAGNOSIS — M797 Fibromyalgia: Secondary | ICD-10-CM | POA: Insufficient documentation

## 2017-09-27 NOTE — Progress Notes (Deleted)
Office Visit Note  Patient: Tracy Valenzuela             Date of Birth: [birth year]           MRN: [id number]             PCP: Delorse Limber Referring: Brunetta Jeans, PA-C Visit Date: 10/02/2017 Occupation: _0 @    Subjective:  No chief complaint on file.   History of Present Illness: Tracy Valenzuela is a 42 y.o. female ***   Activities of Daily Living:  Patient reports morning stiffness for *** {minute/hour:19697}.   Patient {ACTIONS;DENIES/REPORTS:21021675::"Denies"} nocturnal pain.  Difficulty dressing/grooming: {ACTIONS;DENIES/REPORTS:21021675::"Denies"} Difficulty climbing stairs: {ACTIONS;DENIES/REPORTS:21021675::"Denies"} Difficulty getting out of chair: {ACTIONS;DENIES/REPORTS:21021675::"Denies"} Difficulty using hands for taps, buttons, cutlery, and/or writing: {ACTIONS;DENIES/REPORTS:21021675::"Denies"}   No Rheumatology ROS completed.   PMFS History:  Patient Active Problem List   Diagnosis Date Noted  . Chest pain 04/27/2017  . Acute bacterial sinusitis 03/10/2017  . Leiomyoma 10/30/2016  . Prediabetes 08/21/2016  . Gastroesophageal reflux disease 08/21/2016  . Thornwaldt's cyst 05/29/2016  . Morbid obesity ([place]) 11/09/2015  . Bipolar 1 disorder, mixed, moderate ([place]) 09/30/2015  . Decreased attention Span 09/30/2015  . Iron deficiency anemia 10/19/2014    Past Medical History:  Diagnosis Date  . Anemia   . Anxiety   . ASCUS with positive high risk HPV 11/2014  . Asthma   . Bipolar 1 disorder ([place])   . GERD (gastroesophageal reflux disease)   . Heart murmur   . LGSIL (low grade squamous intraepithelial dysplasia) 11/2014   colposcopy biopsy.  recommend follow up pap in one year  . Pre-diabetes   . Prediabetes     Family History  Problem Relation Age of Onset  . Lupus Mother   . Heart disease Mother   . Heart disease Father   . Diabetes Maternal Grandmother   . Heart disease Maternal Grandmother   . Hypertension Maternal  Grandmother   . Prostate cancer Maternal Grandfather   . Alzheimer's disease Paternal Grandfather   . ADD / ADHD Son   . Colon cancer Neg Hx   . Stomach cancer Neg Hx    Past Surgical History:  Procedure Laterality Date  . CARDIAC CATHETERIZATION    . CYSTOSCOPY  10/30/2016   Procedure: CYSTOSCOPY;  Surgeon: Anastasio Auerbach, MD;  Location: [facility];  Service: Gynecology;;  . LAPAROSCOPIC VAGINAL HYSTERECTOMY WITH SALPINGECTOMY Bilateral 10/30/2016   Procedure: LAPAROSCOPIC ASSISTED VAGINAL HYSTERECTOMY WITH SALPINGECTOMY;  Surgeon: Anastasio Auerbach, MD;  Location: [facility];  Service: Gynecology;  Laterality: Bilateral;  . TUBAL LIGATION     Social History   Social History Narrative   Fun: Sleep      Objective: Vital Signs: LMP  (LMP Unknown)    Physical Exam   Musculoskeletal Exam: ***  CDAI Exam: No CDAI exam completed.    Investigation: No additional findings. August 15, 2017 UA negative, SPEP negative, CK 158, vitamin D 17 , RF negative, anti-CCP negative, uric acid 5.7, ACE 52, ESR 19  Imaging: No results found.  Speciality Comments: No specialty comments available.    Procedures:  No procedures performed Allergies: Penicillins   Assessment / Plan:     Visit Diagnoses: No diagnosis found.    Orders: No orders of the defined types were placed in this encounter.  No orders of the defined types were placed in this encounter.   Face-to-face time spent with patient was *** minutes. 50% of time was spent in counseling and  coordination of care.  Follow-Up Instructions: No follow-ups on file.   Bo Merino, MD  Note - This record has been created using Editor, commissioning.  Chart creation errors have been sought, but may not always  have been located. Such creation errors do not reflect on  the standard of medical care.

## 2017-10-02 ENCOUNTER — Ambulatory Visit: Payer: 59 | Admitting: Rheumatology

## 2017-11-18 ENCOUNTER — Encounter: Payer: Self-pay | Admitting: Physician Assistant

## 2017-11-18 ENCOUNTER — Other Ambulatory Visit: Payer: Self-pay

## 2017-11-18 ENCOUNTER — Encounter (HOSPITAL_COMMUNITY): Payer: Self-pay | Admitting: Emergency Medicine

## 2017-11-18 ENCOUNTER — Emergency Department (HOSPITAL_COMMUNITY)
Admission: EM | Admit: 2017-11-18 | Discharge: 2017-11-18 | Disposition: A | Discharge status: Home / Self Care | Payer: 59 | Attending: Emergency Medicine | Admitting: Emergency Medicine

## 2017-11-18 ENCOUNTER — Emergency Department (HOSPITAL_COMMUNITY): Payer: 59

## 2017-11-18 DIAGNOSIS — J020 Streptococcal pharyngitis: Secondary | ICD-10-CM

## 2017-11-18 DIAGNOSIS — Z79899 Other long term (current) drug therapy: Secondary | ICD-10-CM | POA: Insufficient documentation

## 2017-11-18 DIAGNOSIS — Z87891 Personal history of nicotine dependence: Secondary | ICD-10-CM | POA: Diagnosis not present

## 2017-11-18 DIAGNOSIS — J0301 Acute recurrent streptococcal tonsillitis: Secondary | ICD-10-CM

## 2017-11-18 DIAGNOSIS — J45909 Unspecified asthma, uncomplicated: Secondary | ICD-10-CM | POA: Insufficient documentation

## 2017-11-18 DIAGNOSIS — H9203 Otalgia, bilateral: Secondary | ICD-10-CM | POA: Insufficient documentation

## 2017-11-18 DIAGNOSIS — R1310 Dysphagia, unspecified: Secondary | ICD-10-CM | POA: Insufficient documentation

## 2017-11-18 DIAGNOSIS — R070 Pain in throat: Secondary | ICD-10-CM | POA: Insufficient documentation

## 2017-11-18 DIAGNOSIS — M7918 Myalgia, other site: Secondary | ICD-10-CM | POA: Diagnosis not present

## 2017-11-18 DIAGNOSIS — R51 Headache: Secondary | ICD-10-CM | POA: Diagnosis not present

## 2017-11-18 DIAGNOSIS — J029 Acute pharyngitis, unspecified: Secondary | ICD-10-CM | POA: Diagnosis not present

## 2017-11-18 DIAGNOSIS — R0981 Nasal congestion: Secondary | ICD-10-CM | POA: Diagnosis present

## 2017-11-18 DIAGNOSIS — R0602 Shortness of breath: Secondary | ICD-10-CM | POA: Diagnosis not present

## 2017-11-18 LAB — BASIC METABOLIC PANEL
Anion gap: 13 (ref 5–15)
BUN: 7 mg/dL (ref 6–20)
CO2: 23 mmol/L (ref 22–32)
Calcium: 9 mg/dL (ref 8.9–10.3)
Chloride: 102 mmol/L (ref 98–111)
Creatinine, Ser: 0.81 mg/dL (ref 0.44–1.00)
GFR calc Af Amer: >60 mL/min (ref >60)
GFR calc non Af Amer: >60 mL/min (ref >60)
Glucose, Bld: 109 mg/dL — ABNORMAL HIGH (ref 70–99)
Potassium: 3.7 mmol/L (ref 3.5–5.1)
Sodium: 138 mmol/L (ref 135–145)

## 2017-11-18 LAB — CBC WITH DIFFERENTIAL/PLATELET
Abs Immature Granulocytes: 0 10*3/uL (ref 0.0–0.1)
Basophils Absolute: 0 10*3/uL (ref 0.0–0.1)
Basophils Relative: 0 %
Eosinophils Absolute: 0.2 10*3/uL (ref 0.0–0.7)
Eosinophils Relative: 2 %
HCT: 34.2 % — ABNORMAL LOW (ref 36.0–46.0)
Hemoglobin: 11 g/dL — ABNORMAL LOW (ref 12.0–15.0)
Immature Granulocytes: 0 %
Lymphocytes Relative: 19 %
Lymphs Abs: 1.5 10*3/uL (ref 0.7–4.0)
MCH: 28.9 pg (ref 26.0–34.0)
MCHC: 32.2 g/dL (ref 30.0–36.0)
MCV: 89.8 fL (ref 78.0–100.0)
Monocytes Absolute: 0.8 10*3/uL (ref 0.1–1.0)
Monocytes Relative: 11 %
Neutro Abs: 5.2 10*3/uL (ref 1.7–7.7)
Neutrophils Relative %: 68 %
Platelets: 283 10*3/uL (ref 150–400)
RBC: 3.81 MIL/uL — ABNORMAL LOW (ref 3.87–5.11)
RDW: 12.5 % (ref 11.5–15.5)
WBC: 7.7 10*3/uL (ref 4.0–10.5)

## 2017-11-18 LAB — GROUP A STREP BY PCR: Group A Strep by PCR: DETECTED — AB

## 2017-11-18 MED ORDER — FLUTICASONE PROPIONATE 50 MCG/ACT NA SUSP: 2.0000 | Freq: Every day | NASAL | 0 refills | Status: DC

## 2017-11-18 MED ORDER — DEXAMETHASONE SODIUM PHOSPHATE 10 MG/ML IJ SOLN: 10.0000 mg | Freq: Once | INTRAMUSCULAR | Status: DC

## 2017-11-18 MED ORDER — DEXAMETHASONE SODIUM PHOSPHATE 10 MG/ML IJ SOLN
10.0000 mg | Freq: Once | INTRAMUSCULAR | Status: AC
  Administered 2017-11-18: 10 mg via INTRAVENOUS
  Filled 2017-11-18: qty 1

## 2017-11-18 MED ORDER — IOHEXOL 300 MG/ML  SOLN
100.0000 mL | Freq: Once | INTRAMUSCULAR | Status: AC | PRN
  Administered 2017-11-18: 100 mL via INTRAVENOUS

## 2017-11-18 MED ORDER — CLINDAMYCIN HCL 150 MG PO CAPS: 300.0000 mg | ORAL_CAPSULE | Freq: Three times a day (TID) | ORAL | 0 refills | Status: DC

## 2017-11-18 MED ORDER — PREDNISONE 10 MG PO TABS: 40.0000 mg | ORAL_TABLET | Freq: Every day | ORAL | 0 refills | Status: AC

## 2017-11-18 MED ORDER — KETOROLAC TROMETHAMINE 30 MG/ML IJ SOLN
30.0000 mg | Freq: Once | INTRAMUSCULAR | Status: AC
Start: 2017-11-18 — End: 2017-11-18
  Administered 2017-11-18: 30 mg via INTRAVENOUS
  Filled 2017-11-18: qty 1

## 2017-11-18 MED ORDER — CLINDAMYCIN PHOSPHATE 600 MG/50ML IV SOLN
600.0000 mg | Freq: Once | INTRAVENOUS | Status: AC
  Administered 2017-11-18: 600 mg via INTRAVENOUS
  Filled 2017-11-18: qty 50

## 2017-11-18 MED ORDER — CLINDAMYCIN HCL 150 MG PO CAPS: 300.0000 mg | ORAL_CAPSULE | Freq: Three times a day (TID) | ORAL | 0 refills | Status: AC

## 2017-11-18 MED ORDER — PREDNISONE 10 MG PO TABS: 40.0000 mg | ORAL_TABLET | Freq: Every day | ORAL | 0 refills | Status: DC

## 2017-11-18 NOTE — ED Provider Notes (Signed)
Patient seen/examined in the Emergency Department in conjunction with Midlevel Provider [facility] Patient reports sore throat difficulty swallowing. Exam : Awake alert, tonsillar hypertrophy with erythema and exudates.  No drooling, no stridor.  Diffuse anterior neck tenderness. Plan: Plan for CT soft tissue neck Recommended empirically treating with Decadron and clindamycin.   Ripley Fraise, MD 11/18/17 [phone]

## 2017-11-18 NOTE — Discharge Instructions (Addendum)
Please take all of your antibiotics until finished!   You may develop abdominal discomfort or diarrhea from the antibiotic.  You may help offset this with probiotics which you can buy or get in yogurt. Do not eat  or take the probiotics until 2 hours after your antibiotic.   Take 500 to 1000 mg of Tylenol every 6 hours as needed for pain.  Use warm water salt gargles, warm teas, honey, and over-the-counter Chloraseptic spray for treatment of sore throat.  You can also use throat lozenges/cough drops.  Use Flonase for nasal congestion.  Drink plenty of fluids and get plenty of rest.  Follow-up with your PCP for reevaluation of your symptoms.  You may follow-up with ENT for discussion of possible tonsil removal.  Return to the emergency department if any concerning signs or symptoms develop such as drooling, facial swelling, high fevers not controlled by ibuprofen or Tylenol, or difficulty breathing or swallowing.

## 2017-11-18 NOTE — ED Triage Notes (Signed)
Pt reports cough, nasal congestion, sinus pressure, sore throat X few days. Pt states she has a "cyst in her throat" that is "flaring up"

## 2017-11-18 NOTE — ED Notes (Signed)
ED Provider at bedside. 

## 2017-11-18 NOTE — Telephone Encounter (Signed)
Please call patient to schedule ER follow-up later this week.

## 2017-11-18 NOTE — ED Notes (Signed)
Patient given discharge instructions and verbalized understanding.  Patient stable to discharge at this time.  Patient is alert and oriented to baseline.  No distressed noted at this time.  All belongings taken with the patient at discharge.   

## 2017-11-18 NOTE — ED Provider Notes (Signed)
[facility] EMERGENC[facility] DEPARTMENT Provider Note   CSN: [id number] Arrival date & time: 11/18/17  0537     History   Chief Complaint Chief Complaint  Patient presents with  . Nasal Congestion    HPI Tracy Valenzuela is a 42 y.o. female with history of anemia, anxiety, asthma, bipolar 1 disorder, GERD, prediabetes presents for evaluation of acute onset, progressively worsening generalized body aches, sore throat, and nasal congestion for 3 days.  She states that she "was not feeling well "3 days ago which worsened yesterday.  She notes sore throat with odynophagia and resulting dysphagia.  She denies drooling or choking and is able to tolerate p.o. liquids.  She endorses sinus pressure, bilateral ear pain, and nasal congestion.  She has tried Mucinex without significant relief of her symptoms.  She states that her symptoms worsen when she lays flat and improves somewhat sitting up and she states "I am afraid to fall asleep because it would feel like I cannot swallow or breathe".  Notes significant pain to the anterior neck. Denies chest pains, cough, or vomiting.  She states that once a year she will have similar symptoms and her PCP will prescribe her prednisone and an antibiotic.  Chart review shows that she presented on 05/23/2016 to the ED with somewhat similar symptoms and underwent CT soft tissue neck which showed "1 cm enhancing low-density lesion in the nasopharynx in midline likely a Tornwaldt cyst. Possibly inflamed."  The history is provided by the patient.    Past Medical History:  Diagnosis Date  . Anemia   . Anxiety   . ASCUS with positive high risk HPV 11/2014  . Asthma   . Bipolar 1 disorder ([place])   . GERD (gastroesophageal reflux disease)   . Heart murmur   . LGSIL (low grade squamous intraepithelial dysplasia) 11/2014   colposcopy biopsy.  recommend follow up pap in one year  . Pre-diabetes   . Prediabetes     Patient Active Problem List   Diagnosis  Date Noted  . Myofascial pain 09/27/2017  . Primary osteoarthritis of both knees 09/27/2017  . Vitamin D deficiency 09/27/2017  . Chest pain 04/27/2017  . Acute bacterial sinusitis 03/10/2017  . Leiomyoma 10/30/2016  . Prediabetes 08/21/2016  . Gastroesophageal reflux disease 08/21/2016  . Thornwaldt's cyst 05/29/2016  . Morbid obesity ([place]) 11/09/2015  . Bipolar 1 disorder, mixed, moderate ([place]) 09/30/2015  . Decreased attention Span 09/30/2015  . Iron deficiency anemia 10/19/2014    Past Surgical History:  Procedure Laterality Date  . CARDIAC CATHETERIZATION    . CYSTOSCOPY  10/30/2016   Procedure: CYSTOSCOPY;  Surgeon: Anastasio Auerbach, MD;  Location: [facility];  Service: Gynecology;;  . LAPAROSCOPIC VAGINAL HYSTERECTOMY WITH SALPINGECTOMY Bilateral 10/30/2016   Procedure: LAPAROSCOPIC ASSISTED VAGINAL HYSTERECTOMY WITH SALPINGECTOMY;  Surgeon: Anastasio Auerbach, MD;  Location: [facility];  Service: Gynecology;  Laterality: Bilateral;  . TUBAL LIGATION       OB History    Gravida  3   Para  1   Term      Preterm  1   AB  1   Living  3     SAB  1   TAB      Ectopic  0   Multiple      Live Births           Obstetric Comments  Had a set of twins that were preterm (30 weeks)  Home Medications    Prior to Admission medications   Medication Sig Start Date End Date Taking? Authorizing Provider  cholecalciferol (VITAMIN D) 1000 units tablet Take 1 tablet (1,000 Units total) by mouth daily. 06/10/17   Brunetta Jeans, PA-C  clindamycin (CLEOCIN) 150 MG capsule Take 2 capsules (300 mg total) by mouth 3 (three) times daily for 7 days. 11/18/17 11/25/17  Rodell Perna A, PA-C  fluticasone (FLONASE) 50 MCG/ACT nasal spray Place 2 sprays into both nostrils daily. 11/18/17   ,  A, PA-C  guaiFENesin-codeine 100-10 MG/5ML syrup Take 5 mLs by mouth every 6 (six) hours as needed for cough. Patient not taking: Reported on 08/15/2017 06/28/17   Leamon Arnt, MD    oseltamivir (TAMIFLU) 75 MG capsule Take 1 capsule (75 mg total) by mouth 2 (two) times daily. Patient not taking: Reported on 08/15/2017 06/28/17   Leamon Arnt, MD  predniSONE (DELTASONE) 10 MG tablet Take 4 tablets (40 mg total) by mouth daily with breakfast for 5 days. 11/18/17 11/23/17  Rodell Perna A, PA-C  QUEtiapine (SEROQUEL) 100 MG tablet Take 1 tablet (100 mg total) by mouth at bedtime. Patient not taking: Reported on 08/15/2017 12/13/16   Golden Circle, FNP  topiramate (TOPAMAX) 50 MG tablet Take 2 tablets (100 mg total) by mouth at bedtime. 05/06/17   Brunetta Jeans, PA-C  Vitamin D, Ergocalciferol, (DRISDOL) 50000 units CAPS capsule Take 1 capsule by mouth once weekly for 12 weeks 06/10/17   Brunetta Jeans, PA-C  Vitamin D, Ergocalciferol, (DRISDOL) 50000 units CAPS capsule Take 1 capsule (50,000 Units total) by mouth 2 (two) times a week. 08/19/17   Bo Merino, MD    Family History Family History  Problem Relation Age of Onset  . Lupus Mother   . Heart disease Mother   . Heart disease Father   . Diabetes Maternal Grandmother   . Heart disease Maternal Grandmother   . Hypertension Maternal Grandmother   . Prostate cancer Maternal Grandfather   . Alzheimer's disease Paternal Grandfather   . ADD / ADHD Son   . Colon cancer Neg Hx   . Stomach cancer Neg Hx     Social History Social History   Tobacco Use  . Smoking status: Former Smoker    Types: Cigarettes  . Smokeless tobacco: Never Used  . Tobacco comment: not smoking at all  Substance Use Topics  . Alcohol use: Yes    Alcohol/week: 0.0 oz  . Drug use: No     Allergies   Penicillins   Review of Systems Review of Systems  Constitutional: Positive for chills. Negative for fever.  HENT: Positive for congestion, ear pain, sinus pressure, sinus pain, sore throat, trouble swallowing and voice change. Negative for drooling, ear discharge and facial swelling.   Respiratory: Positive for shortness of  breath.   Cardiovascular: Negative for chest pain.  Gastrointestinal: Negative for nausea and vomiting.  Musculoskeletal: Positive for myalgias and neck pain.  Neurological: Positive for headaches. Negative for syncope.  All other systems reviewed and are negative.    Physical Exam Updated Vital Signs BP 112/74   Pulse 69   Temp 98.7 F (37.1 C) (Oral)   Resp 16   Ht 5\' 7"  (1.702 m)   Wt 120.2 kg (265 lb)   LMP  (LMP Unknown)   SpO2 98%   BMI 41.50 kg/m   Physical Exam  Constitutional: She appears well-developed and well-nourished. No distress.  Resting comfortably in bed  HENT:  Head: Normocephalic and atraumatic.  Mouth/Throat: Uvula is midline and mucous membranes are normal. No uvula swelling. Posterior oropharyngeal erythema present. No posterior oropharyngeal edema. Tonsils are 4+ on the right. Tonsils are 4+ on the left. Tonsillar exudate.  TMs with mid ear effusion bilaterally, no erythema or bulging.  There is nasal congestion bilaterally, left worse than right, nasal septum midline.  There is frontal and maxillary sinus tenderness bilaterally.  There is 4+ tonsillar hypertrophy bilaterally with scant white exudate. Uvula is midline.  Patient is tolerating secretions.  She has a "hot potato" voice.  There is mild trismus although the mouth opens to 2-3 finger widths easily.   Eyes: Conjunctivae are normal. Right eye exhibits no discharge. Left eye exhibits no discharge.  Neck: Normal range of motion. Neck supple. No JVD present. No spinous process tenderness present. No tracheal deviation present.   There is diffuse tenderness to palpation of the anterior neck including the submental and submandibular regions.  No upper airway stridor  Cardiovascular: Normal rate, regular rhythm and normal heart sounds.  Pulmonary/Chest: Effort normal and breath sounds normal. No stridor. No respiratory distress. She has no wheezes. She has no rales.  Equal rise and fall of chest, no  increased work of breathing, speaking in full sentences without difficulty  Abdominal: She exhibits no distension.  Musculoskeletal: She exhibits no edema.  Neurological: She is alert.  Skin: Skin is warm and dry. No erythema.  Psychiatric: She has a normal mood and affect. Her behavior is normal.  Nursing note and vitals reviewed.    ED Treatments / Results  Labs (all labs ordered are listed, but only abnormal results are displayed) Labs Reviewed  GROUP A STREP BY PCR - Abnormal; Notable for the following components:      Result Value   Group A Strep by PCR DETECTED (*)    All other components within normal limits  BASIC METABOLIC PANEL - Abnormal; Notable for the following components:   Glucose, Bld 109 (*)    All other components within normal limits  CBC WITH DIFFERENTIAL/PLATELET - Abnormal; Notable for the following components:   RBC 3.81 (*)    Hemoglobin 11.0 (*)    HCT 34.2 (*)    All other components within normal limits    EKG None  Radiology Ct Soft Tissue Neck W Contrast  Result Date: 11/18/2017 CLINICAL DATA:  Sore throat with cough. EXAM: CT NECK WITH CONTRAST TECHNIQUE: Multidetector CT imaging of the neck was performed using the standard protocol following the bolus administration of intravenous contrast. CONTRAST:  165mL OMNIPAQUE IOHEXOL 300 MG/ML  SOLN COMPARISON:  05/23/2016 FINDINGS: Pharynx and larynx: Thickened palatine tonsils asymmetric to the left, touching in the midline and distorted by patient motion. Negative for collection. No supraglottic edema. Re-demonstrated Tornwaldt cyst without superimposed wall thickening or fat edema. Salivary glands: No inflammation, mass, or stone. Thyroid: Normal. Lymph nodes: Symmetric mild jugular chain nodal enlargement considered reactive in this setting. Vascular: Negative Limited intracranial: Negative Visualized orbits: Negative Mastoids and visualized paranasal sinuses: Clear Skeleton: No acute or aggressive  process. Upper chest: Negative. IMPRESSION: Tonsillitis versus tonsillar hypertrophy with mild cervical adenitis. Negative for abscess. Electronically Signed   By: Monte Fantasia M.D.   On: 11/18/2017 08:32    Procedures Procedures (including critical care time)  Medications Ordered in ED Medications  dexamethasone (DECADRON) injection 10 mg (10 mg Intravenous Given 11/18/17 0713)  clindamycin (CLEOCIN) IVPB 600 mg (0 mg Intravenous Stopped 11/18/17 0757)  ketorolac (TORADOL) 30 MG/ML injection 30 mg (30 mg Intravenous Given 11/18/17 0844)  iohexol (OMNIPAQUE) 300 MG/ML solution 100 mL (100 mLs Intravenous Contrast Given 11/18/17 0824)     Initial Impression / Assessment and Plan / ED Course  I have reviewed the triage vital signs and the nursing notes.  Pertinent labs & imaging results that were available during my care of the patient were reviewed by me and considered in my medical decision making (see chart for details).     Patient complaining of sore throat, nasal congestion, sinus pressure, generalized myalgias.  She is afebrile, vital signs are stable.  She is nontoxic in appearance.  She does speak with a muffled "hot potato" voice and sounds audibly congested.  She is tolerating secretions without difficulty.  She does exhibit "kissing tonsils "and has diffuse tenderness to palpation of the anterior neck.  No significant trismus noted.  She is able to sip fluids in the ED without difficulty.  Will obtain lab work and CT soft tissue neck to evaluate for further soft tissue neck pathology.  Lab work reviewed by me shows no leukocytosis, stable anemia.  No metabolic derangements.  Strep test was positive.  CT shows tonsillitis with mild cervical adenitis, no evidence of abscess in the deep spaces of the neck.  No evidence of PTA or angitis.  She exam is no upper airway stridor.  On reevaluation she is resting comfortably, airway remains patent.  She received Decadron, Clindamycin due to  penicillin allergy, and Toradol with improvement in her pain. Pt appears mildly dehydrated, discussed importance of water rehydration. Recommended PCP follow up.  She will follow-up with her PCP for reevaluation and follow-up with ENT for discussion of possible tonsil removal.  Discussed strict ED return precautions. Pt verbalized understanding of and agreement with plan and is safe for discharge home at this time.   Final Clinical Impressions(s) / ED Diagnoses   Final diagnoses:  Strep pharyngitis    ED Discharge Orders        Ordered    clindamycin (CLEOCIN) 150 MG capsule  3 times daily,   Status:  Discontinued     11/18/17 0849    predniSONE (DELTASONE) 10 MG tablet  Daily with breakfast     11/18/17 0849    fluticasone (FLONASE) 50 MCG/ACT nasal spray  Daily     11/18/17 0849    clindamycin (CLEOCIN) 150 MG capsule  3 times daily,   Status:  Discontinued     11/18/17 0852    clindamycin (CLEOCIN) 150 MG capsule  3 times daily     11/18/17 0857       Renita Papa, PA-C 11/18/17 1707    Ripley Fraise, MD 11/19/17 [phone]

## 2018-05-06 ENCOUNTER — Encounter: Payer: Self-pay | Admitting: Physician Assistant

## 2018-05-06 ENCOUNTER — Ambulatory Visit (INDEPENDENT_AMBULATORY_CARE_PROVIDER_SITE_OTHER): Payer: 59 | Admitting: Physician Assistant

## 2018-05-06 ENCOUNTER — Other Ambulatory Visit: Payer: Self-pay

## 2018-05-06 VITALS — BP 118/80 | HR 57 | Temp 98.5°F | Resp 16 | Ht 67.0 in | Wt 264.0 lb

## 2018-05-06 DIAGNOSIS — J029 Acute pharyngitis, unspecified: Secondary | ICD-10-CM

## 2018-05-06 LAB — POCT RAPID STREP A (OFFICE): Rapid Strep A Screen: NEGATIVE

## 2018-05-06 MED ORDER — PREDNISONE 10 MG PO TABS: ORAL_TABLET | ORAL | 0 refills | Status: AC

## 2018-05-06 NOTE — Patient Instructions (Signed)
Please keep hydrated and get plenty of rest. Take the steroid taper as directed. If you note any changes in mood, please stop the medication and call me.  Alternate tylenol and Ibuprofen if needed for pain. Saline nasal rinses.  Salt-water gargles will also be beneficial. Place a humidifier in the bedroom.

## 2018-05-06 NOTE — Progress Notes (Signed)
Patient presents to clinic today c/o 2.5 days of sore throat, neck tenderness, post nasal drip and nasal congestion. Also notes sinus pressure. Denies fever, chills, headache or ear pain. Notes tonsils feel quite swollen and notes mild difficulty swallowing solids. Denies any noted SOB or dyspnea.   Denies recent travel.  Multiple potential sick contacts.   Past Medical History:  Diagnosis Date  . Anemia   . Anxiety   . ASCUS with positive high risk HPV 11/2014  . Asthma   . Bipolar 1 disorder ([place])   . GERD (gastroesophageal reflux disease)   . Heart murmur   . LGSIL (low grade squamous intraepithelial dysplasia) 11/2014   colposcopy biopsy.  recommend follow up pap in one year  . Pre-diabetes   . Prediabetes     Current Outpatient Medications on File Prior to Visit  Medication Sig Dispense Refill  . QUEtiapine (SEROQUEL) 100 MG tablet Take 1 tablet (100 mg total) by mouth at bedtime. (Patient not taking: Reported on 08/15/2017) 90 tablet 0  . topiramate (TOPAMAX) 50 MG tablet Take 2 tablets (100 mg total) by mouth at bedtime. (Patient not taking: Reported on 05/06/2018) 60 tablet 5   No current facility-administered medications on file prior to visit.     Allergies  Allergen Reactions  . Penicillins Hives    Has patient had a PCN reaction causing immediate rash, facial/tongue/throat swelling, SOB or lightheadedness with hypotension:no Has patient had a PCN reaction causing severe rash involving mucus membranes or skin necrosis: Yes Has patient had a PCN reaction that required hospitalization No Has patient had a PCN reaction occurring within the last 10 years: No If all of the above answers are "NO", then may proceed with Cephalosporin use.     Family History  Problem Relation Age of Onset  . Lupus Mother   . Heart disease Mother   . Heart disease Father   . Diabetes Maternal Grandmother   . Heart disease Maternal Grandmother   . Hypertension Maternal Grandmother   .  Prostate cancer Maternal Grandfather   . Alzheimer's disease Paternal Grandfather   . ADD / ADHD Son   . Colon cancer Neg Hx   . Stomach cancer Neg Hx     Social History   Socioeconomic History  . Marital status: Legally Separated    Spouse name: Not on file  . Number of children: 3  . Years of education: 46  . Highest education level: Not on file  Occupational History  . Occupation: Radio broadcast assistant  Social Needs  . Financial resource strain: Not on file  . Food insecurity:    Worry: Not on file    Inability: Not on file  . Transportation needs:    Medical: Not on file    Non-medical: Not on file  Tobacco Use  . Smoking status: Former Smoker    Types: Cigarettes  . Smokeless tobacco: Never Used  . Tobacco comment: not smoking at all  Substance and Sexual Activity  . Alcohol use: Yes    Alcohol/week: 0.0 standard drinks  . Drug use: No  . Sexual activity: Yes    Birth control/protection: Surgical    Comment: intercourse age 35, sexual partners more than 5  Lifestyle  . Physical activity:    Days per week: Not on file    Minutes per session: Not on file  . Stress: Not on file  Relationships  . Social connections:    Talks on phone: Not on file  Gets together: Not on file    Attends religious service: Not on file    Active member of club or organization: Not on file    Attends meetings of clubs or organizations: Not on file    Relationship status: Not on file  Other Topics Concern  . Not on file  Social History Narrative   Fun: Sleep    Review of Systems - See HPI.  All other ROS are negative.  BP 118/80   Pulse (!) 57   Temp 98.5 F (36.9 C) (Oral)   Resp 16   Ht 5\' 7"  (1.702 m)   Wt 264 lb (119.7 kg)   LMP  (LMP Unknown)   SpO2 98%   BMI 41.35 kg/m   Physical Exam Vitals signs reviewed.  Constitutional:      Appearance: She is well-developed.  HENT:     Head: Normocephalic and atraumatic.     Right Ear: Tympanic membrane normal.      Left Ear: Tympanic membrane normal.     Nose: Congestion present.     Mouth/Throat:     Mouth: Mucous membranes are moist.     Tonsils: No tonsillar exudate or tonsillar abscesses. Swelling: 3+ on the right. 3+ on the left.  Eyes:     Conjunctiva/sclera: Conjunctivae normal.     Pupils: Pupils are equal, round, and reactive to light.  Neck:     Musculoskeletal: Neck supple.  Cardiovascular:     Rate and Rhythm: Normal rate and regular rhythm.     Heart sounds: Normal heart sounds.  Pulmonary:     Breath sounds: Normal breath sounds.  Neurological:     Mental Status: She is alert.    Assessment/Plan: 1. Sore throat Viral etiology most likely but with history of strep, rapid strep test obtained and negative. Significant tonsillar swelling without sign of abscess. Start Prednisone taper (lower dose given history of Bipolar I). Supportive measures and OTC medications reviewed. Follow-up if not improving. - POCT rapid strep A   Leeanne Rio, PA-C

## 2018-05-12 ENCOUNTER — Other Ambulatory Visit: Payer: Self-pay

## 2018-05-12 ENCOUNTER — Encounter: Payer: Self-pay | Admitting: Physician Assistant

## 2018-05-12 ENCOUNTER — Ambulatory Visit: Payer: 59 | Admitting: Physician Assistant

## 2018-05-12 VITALS — BP 110/78 | HR 71 | Temp 97.7°F | Resp 16 | Ht 67.0 in | Wt 269.0 lb

## 2018-05-12 DIAGNOSIS — K219 Gastro-esophageal reflux disease without esophagitis: Secondary | ICD-10-CM

## 2018-05-12 DIAGNOSIS — J310 Chronic rhinitis: Secondary | ICD-10-CM | POA: Diagnosis not present

## 2018-05-12 LAB — POCT RAPID STREP A (OFFICE): Rapid Strep A Screen: NEGATIVE

## 2018-05-12 MED ORDER — FLUTICASONE PROPIONATE 50 MCG/ACT NA SUSP: 2.0000 | Freq: Every day | NASAL | 0 refills | Status: DC

## 2018-05-12 MED ORDER — PANTOPRAZOLE SODIUM 40 MG PO TBEC: 40.0000 mg | DELAYED_RELEASE_TABLET | Freq: Every day | ORAL | 0 refills | Status: DC

## 2018-05-12 MED ORDER — LEVOCETIRIZINE DIHYDROCHLORIDE 5 MG PO TABS: 5.0000 mg | ORAL_TABLET | Freq: Every evening | ORAL | 3 refills | Status: DC

## 2018-05-12 NOTE — Progress Notes (Signed)
Patient presents to clinic today c/o continued sore throat along with significant nasal congestion and copious PND. Also notes reflux and heartburn with a globus sensation. Is noting occasional dry cough. Denies sinus pain, ear pain and tooth pain. Denies fever, chills, chest congestion or SOB. Is taking her medications as directed at last visit. Has not scheduled follow-up with ENT.   Past Medical History:  Diagnosis Date  . Anemia   . Anxiety   . ASCUS with positive high risk HPV 11/2014  . Asthma   . Bipolar 1 disorder ([place])   . GERD (gastroesophageal reflux disease)   . Heart murmur   . LGSIL (low grade squamous intraepithelial dysplasia) 11/2014   colposcopy biopsy.  recommend follow up pap in one year  . Pre-diabetes   . Prediabetes     Current Outpatient Medications on File Prior to Visit  Medication Sig Dispense Refill  . predniSONE (DELTASONE) 10 MG tablet Take 3 tablets (30 mg total) by mouth daily with breakfast for 2 days, THEN 2 tablets (20 mg total) daily with breakfast for 2 days, THEN 1 tablet (10 mg total) daily with breakfast for 2 days. 12 tablet 0  . QUEtiapine (SEROQUEL) 100 MG tablet Take 1 tablet (100 mg total) by mouth at bedtime. (Patient not taking: Reported on 05/12/2018) 90 tablet 0  . topiramate (TOPAMAX) 50 MG tablet Take 2 tablets (100 mg total) by mouth at bedtime. (Patient not taking: Reported on 05/12/2018) 60 tablet 5   No current facility-administered medications on file prior to visit.     Allergies  Allergen Reactions  . Penicillins Hives    Has patient had a PCN reaction causing immediate rash, facial/tongue/throat swelling, SOB or lightheadedness with hypotension:no Has patient had a PCN reaction causing severe rash involving mucus membranes or skin necrosis: Yes Has patient had a PCN reaction that required hospitalization No Has patient had a PCN reaction occurring within the last 10 years: No If all of the above answers are "NO", then may  proceed with Cephalosporin use.     Family History  Problem Relation Age of Onset  . Lupus Mother   . Heart disease Mother   . Heart disease Father   . Diabetes Maternal Grandmother   . Heart disease Maternal Grandmother   . Hypertension Maternal Grandmother   . Prostate cancer Maternal Grandfather   . Alzheimer's disease Paternal Grandfather   . ADD / ADHD Son   . Colon cancer Neg Hx   . Stomach cancer Neg Hx     Social History   Socioeconomic History  . Marital status: Legally Separated    Spouse name: Not on file  . Number of children: 3  . Years of education: 41  . Highest education level: Not on file  Occupational History  . Occupation: Radio broadcast assistant  Social Needs  . Financial resource strain: Not on file  . Food insecurity:    Worry: Not on file    Inability: Not on file  . Transportation needs:    Medical: Not on file    Non-medical: Not on file  Tobacco Use  . Smoking status: Former Smoker    Types: Cigarettes  . Smokeless tobacco: Never Used  . Tobacco comment: not smoking at all  Substance and Sexual Activity  . Alcohol use: Yes    Alcohol/week: 0.0 standard drinks  . Drug use: No  . Sexual activity: Yes    Birth control/protection: Surgical    Comment: intercourse age  25, sexual partners more than 5  Lifestyle  . Physical activity:    Days per week: Not on file    Minutes per session: Not on file  . Stress: Not on file  Relationships  . Social connections:    Talks on phone: Not on file    Gets together: Not on file    Attends religious service: Not on file    Active member of club or organization: Not on file    Attends meetings of clubs or organizations: Not on file    Relationship status: Not on file  Other Topics Concern  . Not on file  Social History Narrative   Fun: Sleep    Review of Systems - See HPI.  All other ROS are negative.  BP 110/78   Pulse 71   Temp 97.7 F (36.5 C) (Oral)   Resp 16   Ht 5\' 7"  (1.702 m)   Wt  269 lb (122 kg)   LMP  (LMP Unknown)   SpO2 99%   BMI 42.13 kg/m   Physical Exam Vitals signs reviewed.  Constitutional:      Appearance: She is well-developed.  HENT:     Head: Normocephalic and atraumatic.     Right Ear: Tympanic membrane and ear canal normal.     Left Ear: Tympanic membrane and ear canal normal.     Nose: Congestion and rhinorrhea present.     Mouth/Throat:     Mouth: Mucous membranes are moist. No oral lesions.     Pharynx: No posterior oropharyngeal erythema or uvula swelling.     Tonsils: Swelling: 3+ on the right. 3+ on the left.  Neck:     Musculoskeletal: Normal range of motion.  Cardiovascular:     Rate and Rhythm: Normal rate and regular rhythm.     Pulses: Normal pulses.     Heart sounds: Normal heart sounds.  Pulmonary:     Effort: Pulmonary effort is normal.  Neurological:     General: No focal deficit present.     Mental Status: She is alert and oriented to person, place, and time.    Recent Results (from the past 2160 hour(s))  POCT rapid strep A     Status: Normal   Collection Time: 05/06/18  3:28 PM  Result Value Ref Range   Rapid Strep A Screen Negative Negative    Assessment/Plan: 1. Chronic rhinitis Start Flonase and Xyzal daily. Continue saline nasal rinses. Follow-up with ENT as scheduled next Tuesday.  - fluticasone (FLONASE) 50 MCG/ACT nasal spray; Place 2 sprays into both nostrils daily.  Dispense: 16 g; Refill: 0 - levocetirizine (XYZAL) 5 MG tablet; Take 1 tablet (5 mg total) by mouth every evening.  Dispense: 30 tablet; Refill: 3 - POCT rapid strep A  2. Gastroesophageal reflux disease without esophagitis GERD diet reviewed. Start Pantoprazole as GERD is worsened with chronic PND. Follow-up discussed. - pantoprazole (PROTONIX) 40 MG tablet; Take 1 tablet (40 mg total) by mouth daily.  Dispense: 30 tablet; Refill: 0   Leeanne Rio, PA-C

## 2018-05-12 NOTE — Patient Instructions (Signed)
Please keep well-hydrated and get plenty of rest. Place a humidifier in the bedroom.  Start the Flonase and the antihistamine as directed. I am starting you on a 2-week course of Protonix for acid reflux. Follow the dietary recommendations below. Schedule a follow-up appointment with your ENT specialist.    DASH Eating Plan DASH stands for "Dietary Approaches to Stop Hypertension." The DASH eating plan is a healthy eating plan that has been shown to reduce high blood pressure (hypertension). It may also reduce your risk for type 2 diabetes, heart disease, and stroke. The DASH eating plan may also help with weight loss. What are tips for following this plan?  General guidelines  Avoid eating more than 2,300 mg (milligrams) of salt (sodium) a day. If you have hypertension, you may need to reduce your sodium intake to 1,500 mg a day.  Limit alcohol intake to no more than 1 drink a day for nonpregnant women and 2 drinks a day for men. One drink equals 12 oz of beer, 5 oz of wine, or 1 oz of hard liquor.  Work with your health care provider to maintain a healthy body weight or to lose weight. Ask what an ideal weight is for you.  Get at least 30 minutes of exercise that causes your heart to beat faster (aerobic exercise) most days of the week. Activities may include walking, swimming, or biking.  Work with your health care provider or diet and nutrition specialist (dietitian) to adjust your eating plan to your individual calorie needs. Reading food labels   Check food labels for the amount of sodium per serving. Choose foods with less than 5 percent of the Daily Value of sodium. Generally, foods with less than 300 mg of sodium per serving fit into this eating plan.  To find whole grains, look for the word "whole" as the first word in the ingredient list. Shopping  Buy products labeled as "low-sodium" or "no salt added."  Buy fresh foods. Avoid canned foods and premade or frozen  meals. Cooking  Avoid adding salt when cooking. Use salt-free seasonings or herbs instead of table salt or sea salt. Check with your health care provider or pharmacist before using salt substitutes.  Do not fry foods. Cook foods using healthy methods such as baking, boiling, grilling, and broiling instead.  Cook with heart-healthy oils, such as olive, canola, soybean, or sunflower oil. Meal planning  Eat a balanced diet that includes: ? 5 or more servings of fruits and vegetables each day. At each meal, try to fill half of your plate with fruits and vegetables. ? Up to 6-8 servings of whole grains each day. ? Less than 6 oz of lean meat, poultry, or fish each day. A 3-oz serving of meat is about the same size as a deck of cards. One egg equals 1 oz. ? 2 servings of low-fat dairy each day. ? A serving of nuts, seeds, or beans 5 times each week. ? Heart-healthy fats. Healthy fats called Omega-3 fatty acids are found in foods such as flaxseeds and coldwater fish, like sardines, salmon, and mackerel.  Limit how much you eat of the following: ? Canned or prepackaged foods. ? Food that is high in trans fat, such as fried foods. ? Food that is high in saturated fat, such as fatty meat. ? Sweets, desserts, sugary drinks, and other foods with added sugar. ? Full-fat dairy products.  Do not salt foods before eating.  Try to eat at least 2 vegetarian meals  each week.  Eat more home-cooked food and less restaurant, buffet, and fast food.  When eating at a restaurant, ask that your food be prepared with less salt or no salt, if possible. What foods are recommended? The items listed may not be a complete list. Talk with your dietitian about what dietary choices are best for you. Grains Whole-grain or whole-wheat bread. Whole-grain or whole-wheat pasta. Brown rice. [place]. Bulgur. Whole-grain and low-sodium cereals. Pita bread. Low-fat, low-sodium crackers. Whole-wheat flour  tortillas. Vegetables Fresh or frozen vegetables (raw, steamed, roasted, or grilled). Low-sodium or reduced-sodium tomato and vegetable juice. Low-sodium or reduced-sodium tomato sauce and tomato paste. Low-sodium or reduced-sodium canned vegetables. Fruits All fresh, dried, or frozen fruit. Canned fruit in natural juice (without added sugar). Meat and other protein foods Skinless chicken or [place]. Ground chicken or [place]. Pork with fat trimmed off. Fish and seafood. Egg whites. Dried beans, peas, or lentils. Unsalted nuts, nut butters, and seeds. Unsalted canned beans. Lean cuts of beef with fat trimmed off. Low-sodium, lean deli meat. Dairy Low-fat (1%) or fat-free (skim) milk. Fat-free, low-fat, or reduced-fat cheeses. Nonfat, low-sodium ricotta or cottage cheese. Low-fat or nonfat yogurt. Low-fat, low-sodium cheese. Fats and oils Soft margarine without trans fats. Vegetable oil. Low-fat, reduced-fat, or light mayonnaise and salad dressings (reduced-sodium). Canola, safflower, olive, soybean, and sunflower oils. Avocado. Seasoning and other foods Herbs. Spices. Seasoning mixes without salt. Unsalted popcorn and pretzels. Fat-free sweets. What foods are not recommended? The items listed may not be a complete list. Talk with your dietitian about what dietary choices are best for you. Grains Baked goods made with fat, such as croissants, muffins, or some breads. Dry pasta or rice meal packs. Vegetables Creamed or fried vegetables. Vegetables in a cheese sauce. Regular canned vegetables (not low-sodium or reduced-sodium). Regular canned tomato sauce and paste (not low-sodium or reduced-sodium). Regular tomato and vegetable juice (not low-sodium or reduced-sodium). Angie Fava. Olives. Fruits Canned fruit in a light or heavy syrup. Fried fruit. Fruit in cream or butter sauce. Meat and other protein foods Fatty cuts of meat. Ribs. Fried meat. Berniece Salines. Sausage. Bologna and other processed lunch meats.  Salami. Fatback. Hotdogs. Bratwurst. Salted nuts and seeds. Canned beans with added salt. Canned or smoked fish. Whole eggs or egg yolks. Chicken or [place] with skin. Dairy Whole or 2% milk, cream, and half-and-half. Whole or full-fat cream cheese. Whole-fat or sweetened yogurt. Full-fat cheese. Nondairy creamers. Whipped toppings. Processed cheese and cheese spreads. Fats and oils Butter. Stick margarine. Lard. Shortening. Ghee. Bacon fat. Tropical oils, such as coconut, palm kernel, or palm oil. Seasoning and other foods Salted popcorn and pretzels. Onion salt, garlic salt, seasoned salt, table salt, and sea salt. Worcestershire sauce. Tartar sauce. Barbecue sauce. Teriyaki sauce. Soy sauce, including reduced-sodium. Steak sauce. Canned and packaged gravies. Fish sauce. Oyster sauce. Cocktail sauce. Horseradish that you find on the shelf. Ketchup. Mustard. Meat flavorings and tenderizers. Bouillon cubes. Hot sauce and Tabasco sauce. Premade or packaged marinades. Premade or packaged taco seasonings. Relishes. Regular salad dressings. Where to find more information:  National Heart, Lung, and [place]: https://wilson-eaton.com/  American Heart Association: www.heart.org Summary  The DASH eating plan is a healthy eating plan that has been shown to reduce high blood pressure (hypertension). It may also reduce your risk for type 2 diabetes, heart disease, and stroke.  With the DASH eating plan, you should limit salt (sodium) intake to 2,300 mg a day. If you have hypertension, you may need to reduce  your sodium intake to 1,500 mg a day.  When on the DASH eating plan, aim to eat more fresh fruits and vegetables, whole grains, lean proteins, low-fat dairy, and heart-healthy fats.  Work with your health care provider or diet and nutrition specialist (dietitian) to adjust your eating plan to your individual calorie needs. This information is not intended to replace advice given to you by your health  care provider. Make sure you discuss any questions you have with your health care provider. Document Released: 04/05/2011 Document Revised: 04/09/2016 Document Reviewed: 04/09/2016 Elsevier Interactive Patient Education  2019 [employer].

## 2018-05-15 DIAGNOSIS — E039 Hypothyroidism, unspecified: Secondary | ICD-10-CM | POA: Diagnosis not present

## 2018-05-15 DIAGNOSIS — G4733 Obstructive sleep apnea (adult) (pediatric): Secondary | ICD-10-CM | POA: Insufficient documentation

## 2018-05-15 DIAGNOSIS — J039 Acute tonsillitis, unspecified: Secondary | ICD-10-CM | POA: Insufficient documentation

## 2018-05-15 DIAGNOSIS — E669 Obesity, unspecified: Secondary | ICD-10-CM | POA: Insufficient documentation

## 2018-05-16 DIAGNOSIS — E039 Hypothyroidism, unspecified: Secondary | ICD-10-CM | POA: Diagnosis not present

## 2018-06-16 ENCOUNTER — Encounter: Payer: Self-pay | Admitting: Physician Assistant

## 2018-06-16 ENCOUNTER — Ambulatory Visit: Payer: 59 | Admitting: Physician Assistant

## 2018-06-16 ENCOUNTER — Other Ambulatory Visit: Payer: Self-pay

## 2018-06-16 VITALS — BP 120/80 | HR 72 | Temp 98.3°F | Resp 16 | Ht 67.0 in | Wt 273.0 lb

## 2018-06-16 DIAGNOSIS — M797 Fibromyalgia: Secondary | ICD-10-CM

## 2018-06-16 MED ORDER — PREGABALIN 50 MG PO CAPS: 50.0000 mg | ORAL_CAPSULE | Freq: Two times a day (BID) | ORAL | 1 refills | Status: DC

## 2018-06-16 MED ORDER — MELOXICAM 15 MG PO TABS: 15.0000 mg | ORAL_TABLET | Freq: Every day | ORAL | 0 refills | Status: DC

## 2018-06-16 MED ORDER — KETOROLAC TROMETHAMINE 60 MG/2ML IM SOLN
60.0000 mg | Freq: Once | INTRAMUSCULAR | Status: AC
  Administered 2018-06-16: 60 mg via INTRAMUSCULAR

## 2018-06-16 NOTE — Patient Instructions (Signed)
Please keep hydrated and get plenty of rest. Start Meloxicam once daily. Tylenol for breakthrough pain. Start the Lyrica twice daily -- this will do a better job for nerve pain.  The shot given today should calm things down a bit.  Follow-up with me in 2 weeks for reassessment and medication adjustment.

## 2018-06-16 NOTE — Progress Notes (Signed)
Patient presents to clinic today c/o myalgias and arthralgias for the past 24 hours. Patient with history of fibromyalgia, not taking anything chronically. States this feels similar to prior episodes. Is not responding to OTC medications. Notes muscle aches in shoulders, neck, back and hips. Denies fever, chills or URI symptoms. Denies redness or swelling of joints. Is no longer managed by Rheumatology.   Past Medical History:  Diagnosis Date  . Anemia   . Anxiety   . ASCUS with positive high risk HPV 11/2014  . Asthma   . Bipolar 1 disorder ([place])   . GERD (gastroesophageal reflux disease)   . Heart murmur   . LGSIL (low grade squamous intraepithelial dysplasia) 11/2014   colposcopy biopsy.  recommend follow up pap in one year  . Pre-diabetes   . Prediabetes     Current Outpatient Medications on File Prior to Visit  Medication Sig Dispense Refill  . fluticasone (FLONASE) 50 MCG/ACT nasal spray Place 2 sprays into both nostrils daily. 16 g 0  . pantoprazole (PROTONIX) 40 MG tablet Take 1 tablet (40 mg total) by mouth daily. 30 tablet 0  . levocetirizine (XYZAL) 5 MG tablet Take 1 tablet (5 mg total) by mouth every evening. (Patient not taking: Reported on 06/16/2018) 30 tablet 3   No current facility-administered medications on file prior to visit.     Allergies  Allergen Reactions  . Penicillins Hives    Has patient had a PCN reaction causing immediate rash, facial/tongue/throat swelling, SOB or lightheadedness with hypotension:no Has patient had a PCN reaction causing severe rash involving mucus membranes or skin necrosis: Yes Has patient had a PCN reaction that required hospitalization No Has patient had a PCN reaction occurring within the last 10 years: No If all of the above answers are "NO", then may proceed with Cephalosporin use.     Family History  Problem Relation Age of Onset  . Lupus Mother   . Heart disease Mother   . Heart disease Father   . Diabetes Maternal  Grandmother   . Heart disease Maternal Grandmother   . Hypertension Maternal Grandmother   . Prostate cancer Maternal Grandfather   . Alzheimer's disease Paternal Grandfather   . ADD / ADHD Son   . Colon cancer Neg Hx   . Stomach cancer Neg Hx     Social History   Socioeconomic History  . Marital status: Legally Separated    Spouse name: Not on file  . Number of children: 3  . Years of education: 84  . Highest education level: Not on file  Occupational History  . Occupation: Radio broadcast assistant  Social Needs  . Financial resource strain: Not on file  . Food insecurity:    Worry: Not on file    Inability: Not on file  . Transportation needs:    Medical: Not on file    Non-medical: Not on file  Tobacco Use  . Smoking status: Former Smoker    Types: Cigarettes  . Smokeless tobacco: Never Used  . Tobacco comment: not smoking at all  Substance and Sexual Activity  . Alcohol use: Yes    Alcohol/week: 0.0 standard drinks  . Drug use: No  . Sexual activity: Yes    Birth control/protection: Surgical    Comment: intercourse age 12, sexual partners more than 5  Lifestyle  . Physical activity:    Days per week: Not on file    Minutes per session: Not on file  . Stress: Not on  file  Relationships  . Social connections:    Talks on phone: Not on file    Gets together: Not on file    Attends religious service: Not on file    Active member of club or organization: Not on file    Attends meetings of clubs or organizations: Not on file    Relationship status: Not on file  Other Topics Concern  . Not on file  Social History Narrative   Fun: Sleep    Review of Systems - See HPI.  All other ROS are negative.  LMP  (LMP Unknown)   Physical Exam Vitals signs reviewed.  Constitutional:      Appearance: Normal appearance.  HENT:     Head: Normocephalic and atraumatic.  Neck:     Musculoskeletal: Neck supple. Muscular tenderness present. No spinous process tenderness.    Cardiovascular:     Rate and Rhythm: Normal rate and regular rhythm.     Pulses: Normal pulses.     Heart sounds: Normal heart sounds.  Pulmonary:     Effort: Pulmonary effort is normal.     Breath sounds: Normal breath sounds.  Musculoskeletal:     Right shoulder: She exhibits tenderness and pain. She exhibits normal range of motion, no bony tenderness, no spasm and normal strength.     Left shoulder: She exhibits tenderness and pain. She exhibits normal range of motion, no bony tenderness, no spasm and normal strength.     Cervical back: She exhibits tenderness and pain. She exhibits normal range of motion, no bony tenderness and no spasm.     Thoracic back: She exhibits tenderness and pain. She exhibits normal range of motion, no bony tenderness and no spasm.     Lumbar back: She exhibits tenderness and pain. She exhibits normal range of motion, no bony tenderness and no spasm.  Neurological:     Mental Status: She is alert.     Recent Results (from the past 2160 hour(s))  POCT rapid strep A     Status: Normal   Collection Time: 05/06/18  3:28 PM  Result Value Ref Range   Rapid Strep A Screen Negative Negative  POCT rapid strep A     Status: Normal   Collection Time: 05/12/18  1:26 PM  Result Value Ref Range   Rapid Strep A Screen Negative Negative    Assessment/Plan: 1. Fibromyalgia Acute on Chronic. Diagnosed by Rheumatology. No current treatment. Start lyrica 50 mg BID for ongoing issues. Rx Meloxicam. IM Toradol given today. Supportive measures discussed. Follow-up scheduled.   - pregabalin (LYRICA) 50 MG capsule; Take 1 capsule (50 mg total) by mouth 2 (two) times daily.  Dispense: 60 capsule; Refill: 1 - meloxicam (MOBIC) 15 MG tablet; Take 1 tablet (15 mg total) by mouth daily.  Dispense: 15 tablet; Refill: 0   Leeanne Rio, PA-C

## 2018-06-17 ENCOUNTER — Encounter: Payer: Self-pay | Admitting: Physician Assistant

## 2018-06-18 ENCOUNTER — Encounter: Payer: Self-pay | Admitting: Physician Assistant

## 2018-07-01 ENCOUNTER — Ambulatory Visit: Payer: 59 | Admitting: Physician Assistant

## 2018-07-23 ENCOUNTER — Encounter: Payer: Self-pay | Admitting: Physician Assistant

## 2018-08-19 ENCOUNTER — Ambulatory Visit: Payer: Self-pay | Admitting: Physician Assistant

## 2018-08-19 ENCOUNTER — Encounter: Payer: Self-pay | Admitting: Physician Assistant

## 2018-08-19 ENCOUNTER — Other Ambulatory Visit: Payer: Self-pay

## 2018-08-19 ENCOUNTER — Ambulatory Visit (INDEPENDENT_AMBULATORY_CARE_PROVIDER_SITE_OTHER): Payer: 59 | Admitting: Physician Assistant

## 2018-08-19 DIAGNOSIS — J3489 Other specified disorders of nose and nasal sinuses: Secondary | ICD-10-CM | POA: Diagnosis not present

## 2018-08-19 DIAGNOSIS — J310 Chronic rhinitis: Secondary | ICD-10-CM | POA: Diagnosis not present

## 2018-08-19 MED ORDER — BENZONATATE 100 MG PO CAPS: 100.0000 mg | ORAL_CAPSULE | Freq: Three times a day (TID) | ORAL | 0 refills | Status: DC | PRN

## 2018-08-19 MED ORDER — FLUTICASONE PROPIONATE 50 MCG/ACT NA SUSP: 2.0000 | Freq: Every day | NASAL | 0 refills | Status: DC

## 2018-08-19 MED ORDER — LEVOCETIRIZINE DIHYDROCHLORIDE 5 MG PO TABS: 5.0000 mg | ORAL_TABLET | Freq: Every evening | ORAL | 3 refills | Status: DC

## 2018-08-19 NOTE — Telephone Encounter (Signed)
FYI

## 2018-08-19 NOTE — Progress Notes (Signed)
I have discussed the procedure for the virtual visit with the patient who has given consent to proceed with assessment and treatment.    S , CMA     

## 2018-08-19 NOTE — Progress Notes (Signed)
Virtual Visit via Video   I connected with patient on 08/19/18 at  2:20 PM EDT by a video enabled telemedicine application and verified that I am speaking with the correct person using two identifiers.  Location patient: Home Location provider: [facility] Office Persons participating in the virtual visit: Patient, Provider, [place] (Tracy Valenzuela)  I discussed the limitations of evaluation and management by telemedicine and the availability of in person appointments. The patient expressed understanding and agreed to proceed.  Subjective:   HPI:   Patient endorses 3 days of nasal congestion with sneezing and rhinorrhea, watery eyes and scratchy throat. Has also has a mild, dry cough that is worse at night. Denies fever, chills or SOB. Notes some mild maxillary sinus tenderness, frontal sinus pressure and bilateral ear pressure. Denies recent travel or sick contact. Has history of both chronic rhinitis and sinusitis. Has been using Flonase the past day but realized she is out and needs refill. Has not been taking her Xyzal.   ROS:   See pertinent positives and negatives per HPI.  Patient Active Problem List   Diagnosis Date Noted  . Tonsillitis 05/15/2018  . Obstructive sleep apnea of adult 05/15/2018  . Obesity (BMI 35.0-39.9 without comorbidity) 05/15/2018  . Hypothyroidism 05/15/2018  . Myofascial pain 09/27/2017  . Primary osteoarthritis of both knees 09/27/2017  . Vitamin D deficiency 09/27/2017  . Chest pain 04/27/2017  . Acute bacterial sinusitis 03/10/2017  . Leiomyoma 10/30/2016  . Prediabetes 08/21/2016  . Gastroesophageal reflux disease 08/21/2016  . Thornwaldt's cyst 05/29/2016  . Morbid obesity ([place]) 11/09/2015  . Bipolar 1 disorder, mixed, moderate ([place]) 09/30/2015  . Decreased attention Span 09/30/2015  . Iron deficiency anemia 10/19/2014    Social History   Tobacco Use  . Smoking status: Former Smoker    Types: Cigarettes  . Smokeless tobacco:  Never Used  . Tobacco comment: not smoking at all  Substance Use Topics  . Alcohol use: Yes    Alcohol/week: 0.0 standard drinks    Current Outpatient Medications:  .  fluticasone (FLONASE) 50 MCG/ACT nasal spray, Place 2 sprays into both nostrils daily., Disp: 16 g, Rfl: 0 .  pantoprazole (PROTONIX) 40 MG tablet, Take 1 tablet (40 mg total) by mouth daily., Disp: 30 tablet, Rfl: 0 .  pregabalin (LYRICA) 50 MG capsule, Take 1 capsule (50 mg total) by mouth 2 (two) times daily., Disp: 60 capsule, Rfl: 1 .  levocetirizine (XYZAL) 5 MG tablet, Take 1 tablet (5 mg total) by mouth every evening. (Patient not taking: Reported on 06/16/2018), Disp: 30 tablet, Rfl: 3  Allergies  Allergen Reactions  . Penicillins Hives    Has patient had a PCN reaction causing immediate rash, facial/tongue/throat swelling, SOB or lightheadedness with hypotension:no Has patient had a PCN reaction causing severe rash involving mucus membranes or skin necrosis: Yes Has patient had a PCN reaction that required hospitalization No Has patient had a PCN reaction occurring within the last 10 years: No If all of the above answers are "NO", then may proceed with Cephalosporin use.     Objective:   LMP  (LMP Unknown)   Patient is well-developed, well-nourished in no acute distress.  Resting comfortably in chair at home.  Head is normocephalic, atraumatic.  No labored breathing.  Speech is clear and coherent with logical contest.  Patient is alert and oriented at baseline.  No TTP of sinuses on examination with aid of patient.  Throat with mild erythema. Otherwise unremarkable.  Assessment and  Plan:    1. Chronic rhinitis 2. Sinus pressure 3 days of symptoms. Mostly seem related to allergic inflammation and untreated rhinitis. Some maxillary sinus discomfort endorsed by patient but without significant TTP of sinuses on exam (perfromed by patient) so budding sinus infection cannot be ruled out. At this point will  have patient monitor symptoms. Start saline nasal rinse. Resume Flonase (Refill sent) and start the Xyzal (refill sent as she never picked up). Increase fluids and get plenty of rest. Will send in Tessalon for mild cough to help rest at night. Follow-up if not improving. She is low-risk of COVID.    Tracy Rio, PA-C 08/19/2018

## 2018-08-19 NOTE — Telephone Encounter (Signed)
Pt. Complaining of sore throat x 3 days. "I have a lot of problems with my throat." No fever or cough. Ears feel "itchy and stopped up." Requesting a virtual visit. Warm transfer to [facility] in the practice.  Answer Assessment - Initial Assessment Questions 1. ONSET: "When did the throat start hurting?" (Hours or days ago)      3 days ago 2. SEVERITY: "How bad is the sore throat?" (Scale 1-10; mild, moderate or severe)   - MILD (1-3):  doesn't interfere with eating or normal activities   - MODERATE (4-7): interferes with eating some solids and normal activities   - SEVERE (8-10):  excruciating pain, interferes with most normal activities   - SEVERE DYSPHAGIA: can't swallow liquids, drooling     Moderate 3. STREP EXPOSURE: "Has there been any exposure to strep within the past week?" If so, ask: "What type of contact occurred?"      Unsure 4.  VIRAL SYMPTOMS: "Are there any symptoms of a cold, such as a runny nose, cough, hoarse voice or red eyes?"      Ears feel stopped up and itchy 5. FEVER: "Do you have a fever?" If so, ask: "What is your temperature, how was it measured, and when did it start?"     No 6. PUS ON THE TONSILS: "Is there pus on the tonsils in the back of your throat?"     No 7. OTHER SYMPTOMS: "Do you have any other symptoms?" (e.g., difficulty breathing, headache, rash)     No 8. PREGNANCY: "Is there any chance you are pregnant?" "When was your last menstrual period?"     No  Protocols used: SORE THROAT-A-AH

## 2018-08-19 NOTE — Patient Instructions (Signed)
You symptoms mostly seem related to allergic inflammation and untreated rhinitis. At this point we will have you monitor symptoms, keep hydrated and get plenty of rest.  Start saline nasal rinse.  Resume Flonase (Refill sent) and start the Xyzal (resent to pharmacy) I have sent in a prescription for Tessalon for mild cough to help you rest at night.  Follow-up if not improving or if new symptoms develop.

## 2018-08-19 NOTE — Telephone Encounter (Signed)
Patient has appt today for Virtual Visit

## 2018-08-20 ENCOUNTER — Encounter: Payer: Self-pay | Admitting: Physician Assistant

## 2018-08-25 ENCOUNTER — Encounter: Payer: Self-pay | Admitting: Physician Assistant

## 2018-08-25 MED ORDER — DOXYCYCLINE HYCLATE 100 MG PO CAPS: 100.0000 mg | ORAL_CAPSULE | Freq: Two times a day (BID) | ORAL | 0 refills | Status: DC

## 2018-08-26 ENCOUNTER — Encounter: Payer: Self-pay | Admitting: Physician Assistant

## 2018-08-26 NOTE — Telephone Encounter (Signed)
Please reach out to patient to schedule VV follow-up. Can do 4 pm today or anytime tomorrow.

## 2018-08-27 ENCOUNTER — Other Ambulatory Visit: Payer: Self-pay

## 2018-08-27 ENCOUNTER — Encounter: Payer: Self-pay | Admitting: Physician Assistant

## 2018-08-27 ENCOUNTER — Ambulatory Visit (INDEPENDENT_AMBULATORY_CARE_PROVIDER_SITE_OTHER): Payer: 59 | Admitting: Physician Assistant

## 2018-08-27 DIAGNOSIS — M797 Fibromyalgia: Secondary | ICD-10-CM | POA: Diagnosis not present

## 2018-08-27 MED ORDER — PREDNISONE 10 MG PO TABS: ORAL_TABLET | ORAL | 0 refills | Status: DC

## 2018-08-27 MED ORDER — PREGABALIN 75 MG PO CAPS
75.0000 mg | ORAL_CAPSULE | Freq: Two times a day (BID) | ORAL | 1 refills | Status: DC
Start: 2018-08-27 — End: 2018-10-10

## 2018-08-27 MED ORDER — DULOXETINE HCL 20 MG PO CPEP: 20.0000 mg | ORAL_CAPSULE | Freq: Every day | ORAL | 3 refills | Status: DC

## 2018-08-27 NOTE — Progress Notes (Signed)
Virtual Visit via Video   I connected with patient on 08/27/18 at  1:40 PM EDT by a video enabled telemedicine application and verified that I am speaking with the correct person using two identifiers.  Location patient: Home Location provider: [facility], Office Persons participating in the virtual visit: Patient, Provider, [name]  I discussed the limitations of evaluation and management by telemedicine and the availability of in person appointments. The patient expressed understanding and agreed to proceed.  Subjective:   HPI:   Patient presents via Doxy[facility]Me for medication management regarding her fibromyalgia. She is noting continued flare of symptoms since onset of her sinusitis. Also notes stress levels have increased due to COVID which she feels are also contributing. Notes her usual soreness and pain in her upper arms and legs. Also noting in shoulders and lower back but to a lesser degree. Is taking her Lyrica 50 mg BID with some noted improvement. Would like to discuss options for better pain control. She is currently on Doxycycline for sinusitis and notes improving symptoms with medication.   ROS:   See pertinent positives and negatives per HPI.  Patient Active Problem List   Diagnosis Date Noted  . Tonsillitis 05/15/2018  . Obstructive sleep apnea of adult 05/15/2018  . Obesity (BMI 35.0-39.9 without comorbidity) 05/15/2018  . Hypothyroidism 05/15/2018  . Myofascial pain 09/27/2017  . Primary osteoarthritis of both knees 09/27/2017  . Vitamin D deficiency 09/27/2017  . Chest pain 04/27/2017  . Acute bacterial sinusitis 03/10/2017  . Leiomyoma 10/30/2016  . Prediabetes 08/21/2016  . Gastroesophageal reflux disease 08/21/2016  . Thornwaldt's cyst 05/29/2016  . Morbid obesity ([place]) 11/09/2015  . Bipolar 1 disorder, mixed, moderate ([place]) 09/30/2015  . Decreased attention Span 09/30/2015  . Iron deficiency anemia 10/19/2014    Social History    Tobacco Use  . Smoking status: Former Smoker    Types: Cigarettes  . Smokeless tobacco: Never Used  . Tobacco comment: not smoking at all  Substance Use Topics  . Alcohol use: Yes    Alcohol/week: 0.0 standard drinks    Current Outpatient Medications:  .  benzonatate (TESSALON) 100 MG capsule, Take 1 capsule (100 mg total) by mouth 3 (three) times daily as needed for cough., Disp: 30 capsule, Rfl: 0 .  doxycycline (VIBRAMYCIN) 100 MG capsule, Take 1 capsule (100 mg total) by mouth 2 (two) times daily., Disp: 20 capsule, Rfl: 0 .  fluticasone (FLONASE) 50 MCG/ACT nasal spray, Place 2 sprays into both nostrils daily., Disp: 16 g, Rfl: 0 .  levocetirizine (XYZAL) 5 MG tablet, Take 1 tablet (5 mg total) by mouth every evening., Disp: 30 tablet, Rfl: 3 .  pantoprazole (PROTONIX) 40 MG tablet, Take 1 tablet (40 mg total) by mouth daily., Disp: 30 tablet, Rfl: 0 .  DULoxetine (CYMBALTA) 20 MG capsule, Take 1 capsule (20 mg total) by mouth daily., Disp: 30 capsule, Rfl: 3 .  pregabalin (LYRICA) 75 MG capsule, Take 1 capsule (75 mg total) by mouth 2 (two) times daily., Disp: 60 capsule, Rfl: 1  Allergies  Allergen Reactions  . Penicillins Hives    Has patient had a PCN reaction causing immediate rash, facial/tongue/throat swelling, SOB or lightheadedness with hypotension:no Has patient had a PCN reaction causing severe rash involving mucus membranes or skin necrosis: Yes Has patient had a PCN reaction that required hospitalization No Has patient had a PCN reaction occurring within the last 10 years: No If all of the above answers are "NO", then  may proceed with Cephalosporin use.     Objective:   LMP  (LMP Unknown)   Patient is well-developed, well-nourished in no acute distress.  Resting comfortably at home.  Head is normocephalic, atraumatic.  No labored breathing.  Speech is clear and coherent with logical contest.  Patient is alert and oriented at baseline.   Assessment and Plan:     1. Fibromyalgia Will increase Lyrica to 75 mg BID. She is to do this a couple of days before starting the Cymbalta 20 mg daily. Tylenol or Ibuprofen if needed for breakthrough pain. Suspect flare will calm down as her sinusitis continues to resolve. Follow-up scheduled.   - pregabalin (LYRICA) 75 MG capsule; Take 1 capsule (75 mg total) by mouth 2 (two) times daily.  Dispense: 60 capsule; Refill: 1 - DULoxetine (CYMBALTA) 20 MG capsule; Take 1 capsule (20 mg total) by mouth daily.  Dispense: 30 capsule; Refill: 3    Leeanne Rio, [place] 08/27/2018

## 2018-08-28 ENCOUNTER — Telehealth: Payer: Self-pay | Admitting: Physician Assistant

## 2018-08-28 NOTE — Telephone Encounter (Signed)
FMLA forms received today via fax. I have completed these, signed and faxed in. No charge as patient had OV.

## 2018-09-01 ENCOUNTER — Encounter: Payer: Self-pay | Admitting: Physician Assistant

## 2018-09-05 ENCOUNTER — Encounter: Payer: Self-pay | Admitting: Physician Assistant

## 2018-09-05 ENCOUNTER — Ambulatory Visit (INDEPENDENT_AMBULATORY_CARE_PROVIDER_SITE_OTHER): Payer: 59 | Admitting: Physician Assistant

## 2018-09-05 VITALS — BP 132/76 | HR 60 | Temp 98.6°F | Resp 16 | Ht 67.0 in | Wt 270.0 lb

## 2018-09-05 DIAGNOSIS — M797 Fibromyalgia: Secondary | ICD-10-CM

## 2018-09-05 MED ORDER — DULOXETINE HCL 30 MG PO CPEP: 30.0000 mg | ORAL_CAPSULE | Freq: Every day | ORAL | 3 refills | Status: DC

## 2018-09-05 MED ORDER — TIZANIDINE HCL 2 MG PO TABS: 2.0000 mg | ORAL_TABLET | Freq: Every evening | ORAL | 0 refills | Status: DC | PRN

## 2018-09-05 NOTE — Progress Notes (Signed)
Patient presents to clinic today for follow-up of fibromyalgia (current flare). Patient is currently on a regimen of Lyrica 75 mg BID and Cymbalta 20 mg daily. Is taking as directed with noted improvement but still having daily pain, worse in her neck and shoulders. Is trying to rest more as she is between jobs. Is keeping hydrated and trying to keep a well-balanced diet. Just got over sinus infection. Denies fever, chills. Notes fatigue. Marland Kitchen   Past Medical History:  Diagnosis Date  . Anemia   . Anxiety   . ASCUS with positive high risk HPV 11/2014  . Asthma   . Bipolar 1 disorder ([place])   . GERD (gastroesophageal reflux disease)   . Heart murmur   . LGSIL (low grade squamous intraepithelial dysplasia) 11/2014   colposcopy biopsy.  recommend follow up pap in one year  . Pre-diabetes   . Prediabetes     Current Outpatient Medications on File Prior to Visit  Medication Sig Dispense Refill  . DULoxetine (CYMBALTA) 20 MG capsule Take 1 capsule (20 mg total) by mouth daily. 30 capsule 3  . fluticasone (FLONASE) 50 MCG/ACT nasal spray Place 2 sprays into both nostrils daily. 16 g 0  . levocetirizine (XYZAL) 5 MG tablet Take 1 tablet (5 mg total) by mouth every evening. 30 tablet 3  . pantoprazole (PROTONIX) 40 MG tablet Take 1 tablet (40 mg total) by mouth daily. 30 tablet 0  . pregabalin (LYRICA) 75 MG capsule Take 1 capsule (75 mg total) by mouth 2 (two) times daily. 60 capsule 1   No current facility-administered medications on file prior to visit.     Allergies  Allergen Reactions  . Penicillins Hives    Has patient had a PCN reaction causing immediate rash, facial/tongue/throat swelling, SOB or lightheadedness with hypotension:no Has patient had a PCN reaction causing severe rash involving mucus membranes or skin necrosis: Yes Has patient had a PCN reaction that required hospitalization No Has patient had a PCN reaction occurring within the last 10 years: No If all of the above  answers are "NO", then may proceed with Cephalosporin use.     Family History  Problem Relation Age of Onset  . Lupus Mother   . Heart disease Mother   . Heart disease Father   . Diabetes Maternal Grandmother   . Heart disease Maternal Grandmother   . Hypertension Maternal Grandmother   . Prostate cancer Maternal Grandfather   . Alzheimer's disease Paternal Grandfather   . ADD / ADHD Son   . Colon cancer Neg Hx   . Stomach cancer Neg Hx     Social History   Socioeconomic History  . Marital status: Legally Separated    Spouse name: Not on file  . Number of children: 3  . Years of education: 13  . Highest education level: Not on file  Occupational History  . Occupation: Radio broadcast assistant  Social Needs  . Financial resource strain: Not on file  . Food insecurity:    Worry: Not on file    Inability: Not on file  . Transportation needs:    Medical: Not on file    Non-medical: Not on file  Tobacco Use  . Smoking status: Former Smoker    Types: Cigarettes  . Smokeless tobacco: Never Used  . Tobacco comment: not smoking at all  Substance and Sexual Activity  . Alcohol use: Yes    Alcohol/week: 0.0 standard drinks  . Drug use: No  . Sexual activity:  Yes    Birth control/protection: Surgical    Comment: intercourse age 37, sexual partners more than 5  Lifestyle  . Physical activity:    Days per week: Not on file    Minutes per session: Not on file  . Stress: Not on file  Relationships  . Social connections:    Talks on phone: Not on file    Gets together: Not on file    Attends religious service: Not on file    Active member of club or organization: Not on file    Attends meetings of clubs or organizations: Not on file    Relationship status: Not on file  Other Topics Concern  . Not on file  Social History Narrative   Fun: Sleep    Review of Systems - See HPI.  All other ROS are negative.  BP 132/76   Pulse 60   Temp 98.6 F (37 C) (Oral)   Resp 16    Ht 5\' 7"  (1.702 m)   Wt 270 lb (122.5 kg)   LMP  (LMP Unknown)   SpO2 98%   BMI 42.29 kg/m   Physical Exam Vitals signs reviewed.  Constitutional:      Appearance: Normal appearance.  HENT:     Head: Normocephalic and atraumatic.     Mouth/Throat:     Mouth: Mucous membranes are moist.  Eyes:     Conjunctiva/sclera: Conjunctivae normal.  Cardiovascular:     Rate and Rhythm: Normal rate.     Pulses: Normal pulses.     Heart sounds: Normal heart sounds.  Pulmonary:     Effort: Pulmonary effort is normal.     Breath sounds: Normal breath sounds.  Neurological:     General: No focal deficit present.     Mental Status: She is alert and oriented to person, place, and time.     Comments: Pain in her typical trigger points of neck and back, more pronounced in the cervical spine. ROM normal. There is noted muscular tension of trapezius bilaterally.   Psychiatric:        Mood and Affect: Mood normal.      Assessment/Plan: 1. Fibromyalgia Will send in Rx Tizanidine 2 mg tohelp with neck tension and pain during flare. Continue Lyrica as directed. Will increase Cymbalta to 30 mg daily. Supportive measures and gentle stretching exercises reviewed with patient. Follow-up scheduled.  - tiZANidine (ZANAFLEX) 2 MG tablet; Take 1 tablet (2 mg total) by mouth at bedtime as needed for muscle spasms.  Dispense: 30 tablet; Refill: 0 - DULoxetine (CYMBALTA) 30 MG capsule; Take 1 capsule (30 mg total) by mouth daily.  Dispense: 30 capsule; Refill: 3   Leeanne Rio, [place]

## 2018-09-05 NOTE — Patient Instructions (Signed)
Please continue the Lyrica as directed. Start the new dose of Cymbalta (30 mg daily) -- A new prescription has been sent to pharmacy. Also use the Tizanidine in the evening if needed for the neck pain/tension.  Try to work on gentle stretching like yoga or tai chi at home to help with pain.   We will follow-up via video visit in 2-3 weeks for reassessment.

## 2018-09-08 ENCOUNTER — Ambulatory Visit (INDEPENDENT_AMBULATORY_CARE_PROVIDER_SITE_OTHER): Payer: 59 | Admitting: Psychology

## 2018-09-08 DIAGNOSIS — F340 Cyclothymic disorder: Secondary | ICD-10-CM

## 2018-09-09 ENCOUNTER — Ambulatory Visit: Payer: Self-pay

## 2018-09-09 ENCOUNTER — Telehealth: Payer: Self-pay | Admitting: Physician Assistant

## 2018-09-09 NOTE — Telephone Encounter (Signed)
Pt called to find out the status of pt's disability paperwork. Pt stated a fax was sent last week. Please see Mychart message sent on May 8

## 2018-09-09 NOTE — Telephone Encounter (Signed)
This encounter was created in error - please disregard.

## 2018-09-09 NOTE — Telephone Encounter (Signed)
Received 1545 on 09/09/18.0 Please in bin for pickup.

## 2018-09-09 NOTE — Telephone Encounter (Signed)
Pt will reach out provide them with the fax # to get them to refax the pw to our office.

## 2018-09-09 NOTE — Addendum Note (Signed)
Addended by: Corky Sox E on: 09/09/2018 11:12 AM   Modules accepted: Level of Service, SmartSet

## 2018-09-09 NOTE — Telephone Encounter (Signed)
We have not received anything. Does she have a number we can call to verify they are sending to the correct number?

## 2018-09-10 DIAGNOSIS — Z0279 Encounter for issue of other medical certificate: Secondary | ICD-10-CM

## 2018-09-10 NOTE — Telephone Encounter (Signed)
See MyChart message. Paperwork received yesterday afternoon and completed first thing this morning. Was given to [place] who has faxed in. Message sent to patient via [place].

## 2018-09-10 NOTE — Telephone Encounter (Signed)
Paperwork completed this morning and given to CMA to fax.

## 2018-09-15 ENCOUNTER — Encounter: Payer: Self-pay | Admitting: Physician Assistant

## 2018-09-16 ENCOUNTER — Encounter: Payer: Self-pay | Admitting: Physician Assistant

## 2018-09-19 ENCOUNTER — Encounter: Payer: Self-pay | Admitting: Physician Assistant

## 2018-09-19 ENCOUNTER — Ambulatory Visit (INDEPENDENT_AMBULATORY_CARE_PROVIDER_SITE_OTHER): Payer: 59 | Admitting: Physician Assistant

## 2018-09-19 ENCOUNTER — Other Ambulatory Visit: Payer: Self-pay

## 2018-09-19 DIAGNOSIS — M797 Fibromyalgia: Secondary | ICD-10-CM

## 2018-09-19 NOTE — Assessment & Plan Note (Signed)
Improving. Continue current regimen for now, allowing the new dose of Cymbalta to reach its maximal therapeutic effect. Also recommend daily Turmeric supplement (no more than 500 mg daily) to help with inflammation. Again supportive measures reviewed with patient. She will update [place] on how she is doing via MyChart in 2 weeks.

## 2018-09-19 NOTE — Patient Instructions (Signed)
Instructions sent to MyChart.   I am glad things are improving! Keep up with the current regimen. Add on a daily Turmeric supplement 500mg  or less. You can find this in the vitamin section of your pharmacy or grocery store.  I have sent in refills of your medications.  Please message me in a couple of weeks to let us know how you are doing.  Follow-up sooner if needed.  Hang in there!

## 2018-09-19 NOTE — Progress Notes (Signed)
I have discussed the procedure for the virtual visit with the patient who has given consent to proceed with assessment and treatment.    S , CMA     

## 2018-09-19 NOTE — Progress Notes (Signed)
Virtual Visit via Video   I connected with patient on 09/19/18 at 10:00 AM EDT by a video enabled telemedicine application and verified that I am speaking with the correct person using two identifiers.  Location patient: Home Location provider: [facility], Office Persons participating in the virtual visit: Patient, Provider, [place] (Patina Moore)  I discussed the limitations of evaluation and management by telemedicine and the availability of in person appointments. The patient expressed understanding and agreed to proceed.  Subjective:   HPI:   Patient presents via Doxy.me today to follow-up regarding fibromyalgia. At last visit we increased Cymbalta to 30 mg daily and added on nghtly PRN Tizanidine for spasm. Endorses taking these medications as directed along with her 75 mg Lyrica BID. Is tolerating medications well. Is noting overall improvement in her symptoms. Notes the Tizanidine is helping significantly at night and she is resting much better now that the back spasms have calmed down. She has been able to return to work (light duty for now) and is pleased with this. Denies new or worsening symptoms.  ROS:   See pertinent positives and negatives per HPI.  Patient Active Problem List   Diagnosis Date Noted  . Tonsillitis 05/15/2018  . Obstructive sleep apnea of adult 05/15/2018  . Obesity (BMI 35.0-39.9 without comorbidity) 05/15/2018  . Hypothyroidism 05/15/2018  . Myofascial pain 09/27/2017  . Primary osteoarthritis of both knees 09/27/2017  . Vitamin D deficiency 09/27/2017  . Chest pain 04/27/2017  . Acute bacterial sinusitis 03/10/2017  . Leiomyoma 10/30/2016  . Prediabetes 08/21/2016  . Gastroesophageal reflux disease 08/21/2016  . Thornwaldt's cyst 05/29/2016  . Morbid obesity ([place]) 11/09/2015  . Bipolar 1 disorder, mixed, moderate ([place]) 09/30/2015  . Decreased attention Span 09/30/2015  . Iron deficiency anemia 10/19/2014    Social History   Tobacco  Use  . Smoking status: Former Smoker    Types: Cigarettes  . Smokeless tobacco: Never Used  . Tobacco comment: not smoking at all  Substance Use Topics  . Alcohol use: Yes    Alcohol/week: 0.0 standard drinks    Current Outpatient Medications:  .  DULoxetine (CYMBALTA) 30 MG capsule, Take 1 capsule (30 mg total) by mouth daily., Disp: 30 capsule, Rfl: 3 .  fluticasone (FLONASE) 50 MCG/ACT nasal spray, Place 2 sprays into both nostrils daily., Disp: 16 g, Rfl: 0 .  levocetirizine (XYZAL) 5 MG tablet, Take 1 tablet (5 mg total) by mouth every evening., Disp: 30 tablet, Rfl: 3 .  pantoprazole (PROTONIX) 40 MG tablet, Take 1 tablet (40 mg total) by mouth daily., Disp: 30 tablet, Rfl: 0 .  pregabalin (LYRICA) 75 MG capsule, Take 1 capsule (75 mg total) by mouth 2 (two) times daily., Disp: 60 capsule, Rfl: 1 .  tiZANidine (ZANAFLEX) 2 MG tablet, Take 1 tablet (2 mg total) by mouth at bedtime as needed for muscle spasms., Disp: 30 tablet, Rfl: 0  Allergies  Allergen Reactions  . Penicillins Hives    Has patient had a PCN reaction causing immediate rash, facial/tongue/throat swelling, SOB or lightheadedness with hypotension:no Has patient had a PCN reaction causing severe rash involving mucus membranes or skin necrosis: Yes Has patient had a PCN reaction that required hospitalization No Has patient had a PCN reaction occurring within the last 10 years: No If all of the above answers are "NO", then may proceed with Cephalosporin use.     Objective:   Wt 269 lb (122 kg)   LMP  (LMP Unknown)   BMI  42.13 kg/m   Patient is well-developed, well-nourished in no acute distress.  Resting comfortably at home.  Head is normocephalic, atraumatic.  No labored breathing.  Speech is clear and coherent with logical content.  Patient is alert and oriented at baseline.   Assessment and Plan:   Fibromyalgia Improving. Continue current regimen for now, allowing the new dose of Cymbalta to reach its  maximal therapeutic effect. Also recommend daily Turmeric supplement (no more than 500 mg daily) to help with inflammation. Again supportive measures reviewed with patient. She will update [place] on how she is doing via MyChart in 2 weeks.      Leeanne Rio, [place] 09/19/2018

## 2018-09-23 ENCOUNTER — Ambulatory Visit (INDEPENDENT_AMBULATORY_CARE_PROVIDER_SITE_OTHER): Payer: 59 | Admitting: Psychology

## 2018-09-23 DIAGNOSIS — F54 Psychological and behavioral factors associated with disorders or diseases classified elsewhere: Secondary | ICD-10-CM

## 2018-10-07 ENCOUNTER — Ambulatory Visit (INDEPENDENT_AMBULATORY_CARE_PROVIDER_SITE_OTHER): Payer: 59 | Admitting: Psychology

## 2018-10-07 DIAGNOSIS — F54 Psychological and behavioral factors associated with disorders or diseases classified elsewhere: Secondary | ICD-10-CM

## 2018-10-09 ENCOUNTER — Encounter: Payer: Self-pay | Admitting: Physician Assistant

## 2018-10-09 DIAGNOSIS — M797 Fibromyalgia: Secondary | ICD-10-CM

## 2018-10-10 MED ORDER — PREGABALIN 75 MG PO CAPS: 75.0000 mg | ORAL_CAPSULE | Freq: Two times a day (BID) | ORAL | 1 refills | Status: DC

## 2018-10-10 NOTE — Telephone Encounter (Signed)
Lyrica last filled 08/27/18 #60 1 RF LOV: 09/19/18 Fibromyalgia   Last rx for Clonazepam was 12/05/16.

## 2018-10-13 ENCOUNTER — Encounter: Payer: Self-pay | Admitting: Physician Assistant

## 2018-10-13 ENCOUNTER — Other Ambulatory Visit: Payer: Self-pay

## 2018-10-13 ENCOUNTER — Ambulatory Visit (INDEPENDENT_AMBULATORY_CARE_PROVIDER_SITE_OTHER): Payer: 59 | Admitting: Physician Assistant

## 2018-10-13 VITALS — BP 120/80 | HR 80 | Temp 97.4°F | Resp 16 | Ht 67.0 in | Wt 271.0 lb

## 2018-10-13 DIAGNOSIS — F5081 Binge eating disorder: Secondary | ICD-10-CM | POA: Diagnosis not present

## 2018-10-13 DIAGNOSIS — M797 Fibromyalgia: Secondary | ICD-10-CM

## 2018-10-13 MED ORDER — LISDEXAMFETAMINE DIMESYLATE 20 MG PO CAPS: 20.0000 mg | ORAL_CAPSULE | Freq: Every day | ORAL | 0 refills | Status: DC

## 2018-10-13 NOTE — Progress Notes (Signed)
Virtual Visit via Video   I connected with patient on 10/13/18 at  1:40 PM EDT by a video enabled telemedicine application and verified that I am speaking with the correct person using two identifiers.  Location patient: Home Location provider: [facility], Office Persons participating in the virtual visit: Patient, Provider, [place] (Patina Moore)  I discussed the limitations of evaluation and management by telemedicine and the availability of in person appointments. The patient expressed understanding and agreed to proceed.  Subjective:   HPI:   Patient presents today for follow-up of fibromyalgia.Patient is still on a regimen of Cymbalta 30 mg daily and Lyrica 75 mg BID.  Is taking Tizanidine 2 mg nightly which helps with spasm and sleep. Notes patient has a better routine now so is sleeping more consistently. Pain fluctuates on a day-to-day basis. Today about 6/10 (rainy day). Is seeing a chronic disease therapist who has been working with her about chronic pain which is helping. Has seen specialist twice since last visit with another appointment pending for 10/20/18. Is doing some yoga currently but notes exercise is still limited 2/2 pain at present. She feels that weight is a significant contributor to pain as well. Smoothie for breakfast, lunch is biggest meal of the day (protein and vegetables) and usually skips dinner due to fatigue. Notes history of binge eating disorder diagnosed in 2009. Was on Vyvanse for several years for this with improvement but stopped after moving to the area. Has done well until the last year. Notes episodes of binging (even if healthy food like checking and vegetables), but sometimes also junk food binges. Would like to consider being restarted on Vyvanse to help with this.   ROS:   See pertinent positives and negatives per HPI.  Patient Active Problem List   Diagnosis Date Noted  . Tonsillitis 05/15/2018  . Obstructive sleep apnea of adult  05/15/2018  . Obesity (BMI 35.0-39.9 without comorbidity) 05/15/2018  . Hypothyroidism 05/15/2018  . Fibromyalgia 09/27/2017  . Primary osteoarthritis of both knees 09/27/2017  . Vitamin D deficiency 09/27/2017  . Chest pain 04/27/2017  . Acute bacterial sinusitis 03/10/2017  . Leiomyoma 10/30/2016  . Prediabetes 08/21/2016  . Gastroesophageal reflux disease 08/21/2016  . Thornwaldt's cyst 05/29/2016  . Morbid obesity ([place]) 11/09/2015  . Bipolar 1 disorder, mixed, moderate ([place]) 09/30/2015  . Decreased attention Span 09/30/2015  . Iron deficiency anemia 10/19/2014    Social History   Tobacco Use  . Smoking status: Former Smoker    Types: Cigarettes  . Smokeless tobacco: Never Used  . Tobacco comment: not smoking at all  Substance Use Topics  . Alcohol use: Yes    Alcohol/week: 0.0 standard drinks    Current Outpatient Medications:  .  DULoxetine (CYMBALTA) 30 MG capsule, Take 1 capsule (30 mg total) by mouth daily., Disp: 30 capsule, Rfl: 3 .  fluticasone (FLONASE) 50 MCG/ACT nasal spray, Place 2 sprays into both nostrils daily., Disp: 16 g, Rfl: 0 .  levocetirizine (XYZAL) 5 MG tablet, Take 1 tablet (5 mg total) by mouth every evening., Disp: 30 tablet, Rfl: 3 .  pantoprazole (PROTONIX) 40 MG tablet, Take 1 tablet (40 mg total) by mouth daily., Disp: 30 tablet, Rfl: 0 .  pregabalin (LYRICA) 75 MG capsule, Take 1 capsule (75 mg total) by mouth 2 (two) times daily., Disp: 60 capsule, Rfl: 1 .  tiZANidine (ZANAFLEX) 2 MG tablet, Take 1 tablet (2 mg total) by mouth at bedtime as needed for muscle spasms., Disp: 30  tablet, Rfl: 0 .  lisdexamfetamine (VYVANSE) 20 MG capsule, Take 1 capsule (20 mg total) by mouth daily., Disp: 30 capsule, Rfl: 0  Allergies  Allergen Reactions  . Penicillins Hives    Has patient had a PCN reaction causing immediate rash, facial/tongue/throat swelling, SOB or lightheadedness with hypotension:no Has patient had a PCN reaction causing severe rash  involving mucus membranes or skin necrosis: Yes Has patient had a PCN reaction that required hospitalization No Has patient had a PCN reaction occurring within the last 10 years: No If all of the above answers are "NO", then may proceed with Cephalosporin use.     Objective:   BP 120/80   Pulse 80   Temp (!) 97.4 F (36.3 C) (Oral)   Resp 16   Ht 5\' 7"  (1.702 m)   Wt 271 lb (122.9 kg)   LMP  (LMP Unknown)   BMI 42.44 kg/m   Patient is well-developed, well-nourished in no acute distress.  Resting comfortably at home.  Head is normocephalic, atraumatic.  No labored breathing.  Speech is clear and coherent with logical contest.  Patient is alert and oriented at baseline.   Assessment and Plan:   1. Binge eating disorder Will restart Vyvanse at 20 mg dose. Will titrate based on dose and tolerability. Follow-up scheduled in 3-4 weeks. - lisdexamfetamine (VYVANSE) 20 MG capsule; Take 1 capsule (20 mg total) by mouth daily.  Dispense: 30 capsule; Refill: 0  2. Fibromyalgia Stable at present. Continue current regimen. Will have her continue counseling. Continue with yoga and recommend starting water aerobics. Dietary and exercise recommendations reviewed so that we can work towards weight loss which will help with some of her pain levels. Follow-up scheduled.     Leeanne Rio, PA-C 10/13/2018

## 2018-10-13 NOTE — Progress Notes (Signed)
I have discussed the procedure for the virtual visit with the patient who has given consent to proceed with assessment and treatment.    S , CMA     

## 2018-10-21 ENCOUNTER — Ambulatory Visit (INDEPENDENT_AMBULATORY_CARE_PROVIDER_SITE_OTHER): Payer: 59 | Admitting: Psychology

## 2018-10-21 DIAGNOSIS — F54 Psychological and behavioral factors associated with disorders or diseases classified elsewhere: Secondary | ICD-10-CM

## 2018-12-15 IMAGING — CR DG CHEST 2V
2 series · 2 of 2 positions shown · non-contrast
Comparison: 08/11/2013

CLINICAL DATA: Left-sided chest pain

EXAM:
CHEST  2 VIEW

[w chest lat]
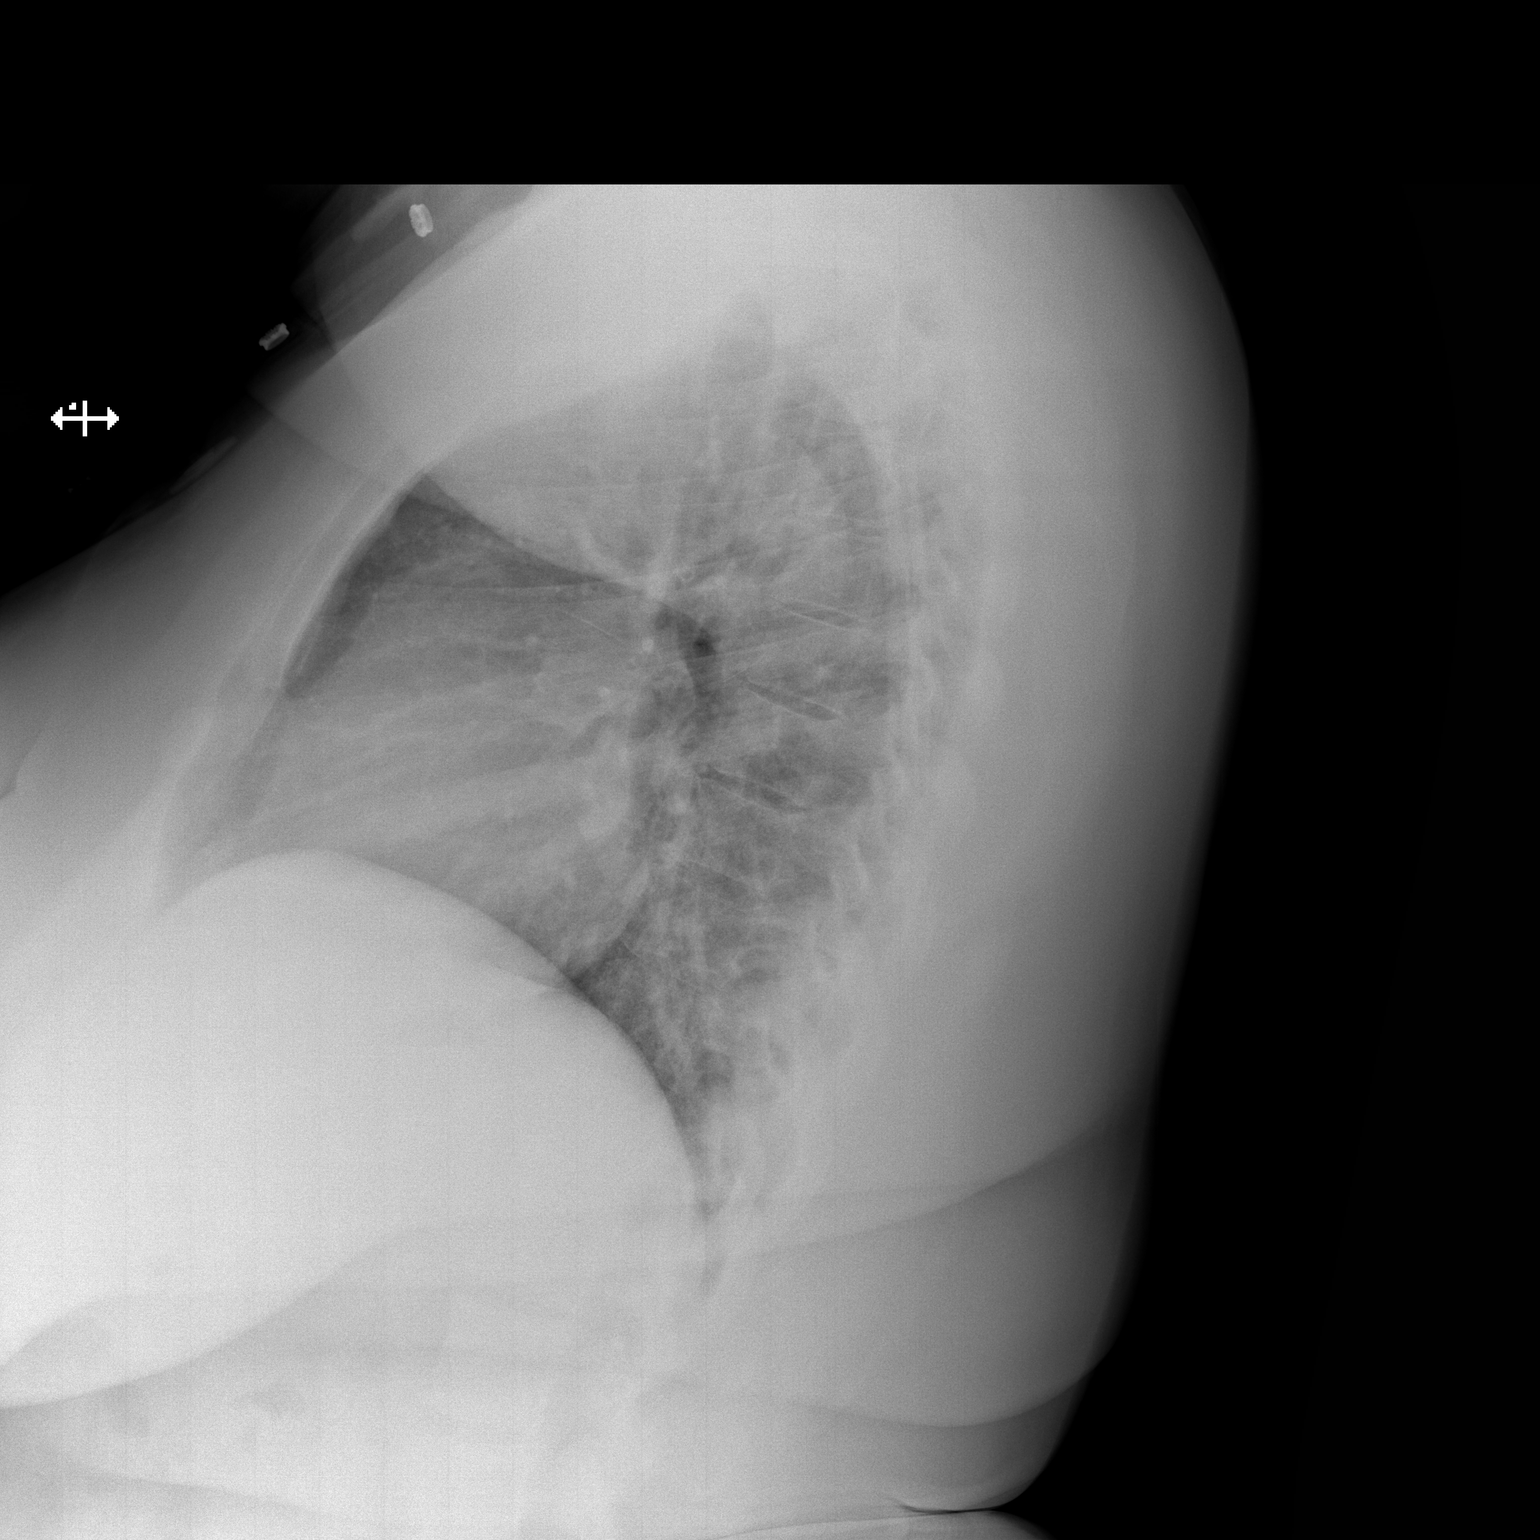

[w chest pa]
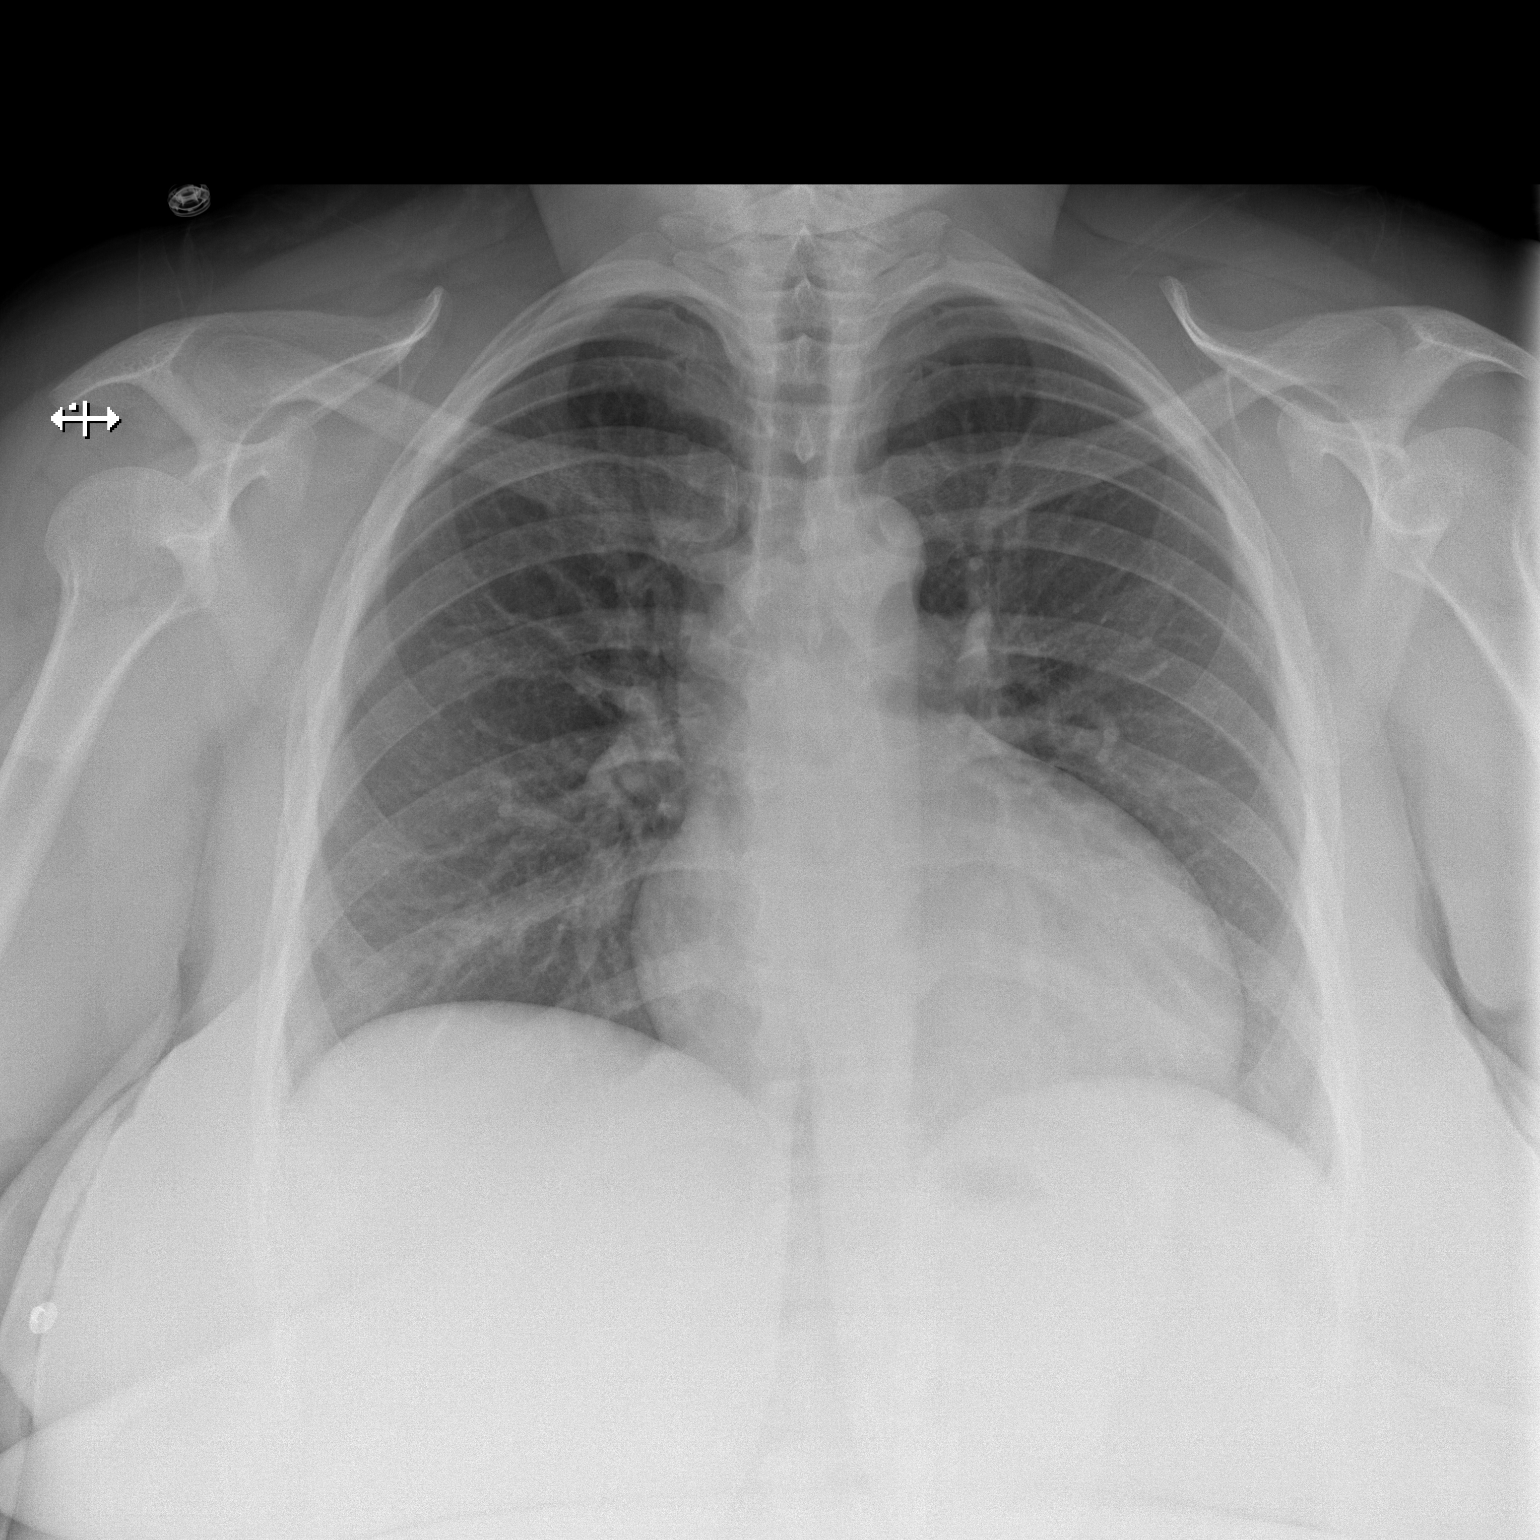

[2 of 2 positions shown; findings below may reference images not displayed]

FINDINGS: Cardiac enlargement. Both lungs are clear. No pleural effusion or
edema. Visualized skeletal structures are unremarkable.
IMPRESSION: 1. No acute intracranial abnormalities.

## 2019-01-20 ENCOUNTER — Encounter: Payer: Self-pay | Admitting: Gynecology

## 2019-02-06 ENCOUNTER — Encounter: Payer: Self-pay | Admitting: Physician Assistant

## 2019-02-06 ENCOUNTER — Other Ambulatory Visit: Payer: Self-pay

## 2019-02-06 ENCOUNTER — Ambulatory Visit (INDEPENDENT_AMBULATORY_CARE_PROVIDER_SITE_OTHER): Payer: 59 | Admitting: Physician Assistant

## 2019-02-06 DIAGNOSIS — M797 Fibromyalgia: Secondary | ICD-10-CM

## 2019-02-06 MED ORDER — METHYLPREDNISOLONE 4 MG PO TBPK: ORAL_TABLET | ORAL | 0 refills | Status: DC

## 2019-02-06 MED ORDER — TIZANIDINE HCL 2 MG PO TABS: 2.0000 mg | ORAL_TABLET | Freq: Every evening | ORAL | 0 refills | Status: DC | PRN

## 2019-02-06 NOTE — Progress Notes (Signed)
Virtual Visit via Video   I connected with patient on 02/06/19 at 11:30 AM EDT by a video enabled telemedicine application and verified that I am speaking with the correct person using two identifiers.  Location patient: Home Location provider: [facility], Office Persons participating in the virtual visit: Patient, Provider, [place] (Patina Moore)  I discussed the limitations of evaluation and management by telemedicine and the availability of in person appointments. The patient expressed understanding and agreed to proceed.  Subjective:   HPI:   Patient presents via Doxy.Me today c/o flare of her fibromyalgia. Patient endorses over the past week she has had increase in myalgias of shoulder, neck, hips and lower back, identical to previous episodes of flares. Is currently on a maintenance regimen of Lyrica 75 mg BID and Cymbalta 30 mg QD. Has been doing very well on this regimen for some time. Notes work responsibilities changed in the past 3 weeks after some staff was let go. She is having to do more physical strenuous tasks at work. She denies fevers, chills. Has not taken anything else OTC for symptoms  ROS:   See pertinent positives and negatives per HPI.  Patient Active Problem List   Diagnosis Date Noted  . Obstructive sleep apnea of adult 05/15/2018  . Obesity (BMI 35.0-39.9 without comorbidity) 05/15/2018  . Hypothyroidism 05/15/2018  . Fibromyalgia 09/27/2017  . Primary osteoarthritis of both knees 09/27/2017  . Vitamin D deficiency 09/27/2017  . Leiomyoma 10/30/2016  . Prediabetes 08/21/2016  . Gastroesophageal reflux disease 08/21/2016  . Thornwaldt's cyst 05/29/2016  . Morbid obesity ([place]) 11/09/2015  . Bipolar 1 disorder, mixed, moderate ([place]) 09/30/2015  . Decreased attention Span 09/30/2015  . Iron deficiency anemia 10/19/2014    Social History   Tobacco Use  . Smoking status: Former Smoker    Types: Cigarettes  . Smokeless tobacco: Never Used  .  Tobacco comment: not smoking at all  Substance Use Topics  . Alcohol use: Yes    Alcohol/week: 0.0 standard drinks    Current Outpatient Medications:  .  DULoxetine (CYMBALTA) 30 MG capsule, Take 1 capsule (30 mg total) by mouth daily., Disp: 30 capsule, Rfl: 3 .  fluticasone (FLONASE) 50 MCG/ACT nasal spray, Place 2 sprays into both nostrils daily., Disp: 16 g, Rfl: 0 .  levocetirizine (XYZAL) 5 MG tablet, Take 1 tablet (5 mg total) by mouth every evening., Disp: 30 tablet, Rfl: 3 .  pantoprazole (PROTONIX) 40 MG tablet, Take 1 tablet (40 mg total) by mouth daily., Disp: 30 tablet, Rfl: 0 .  pregabalin (LYRICA) 75 MG capsule, Take 1 capsule (75 mg total) by mouth 2 (two) times daily., Disp: 60 capsule, Rfl: 1 .  tiZANidine (ZANAFLEX) 2 MG tablet, Take 1 tablet (2 mg total) by mouth at bedtime as needed for muscle spasms. (Patient not taking: Reported on 02/06/2019), Disp: 30 tablet, Rfl: 0  Allergies  Allergen Reactions  . Penicillins Hives    Has patient had a PCN reaction causing immediate rash, facial/tongue/throat swelling, SOB or lightheadedness with hypotension:no Has patient had a PCN reaction causing severe rash involving mucus membranes or skin necrosis: Yes Has patient had a PCN reaction that required hospitalization No Has patient had a PCN reaction occurring within the last 10 years: No If all of the above answers are "NO", then may proceed with Cephalosporin use.     Objective:   LMP  (LMP Unknown)   Patient is well-developed, well-nourished in no acute distress.  Resting comfortably at home.  Head is normocephalic, atraumatic.  No labored breathing.  Speech is clear and coherent with logical content.  Patient is alert and oriented at baseline.   Assessment and Plan:   1. Fibromyalgia Flare. Moderate pain. Afebrile able to ambulate. Continue chronic medications. Will start Medrol pack and Tizanidine. Supportive measures and OTC medications reviewed. Will take her  out of work until Monday. She is to call or message Monday to let us know how she is doing.  - tiZANidine (ZANAFLEX) 2 MG tablet; Take 1 tablet (2 mg total) by mouth at bedtime as needed for muscle spasms.  Dispense: 30 tablet; Refill: 0 - methylPREDNISolone (MEDROL DOSEPAK) 4 MG TBPK tablet; Take following package directions  Dispense: 21 tablet; Refill: 0   Leeanne Rio, [place] 02/06/2019

## 2019-02-06 NOTE — Patient Instructions (Signed)
Instructions sent to MyChart.   Please continue your regular daily medications. Use the Tizanidine and Medrol pack as directed. Limit heavy lifting and overexertion. No driving if having to take the Tizanidine. Tylenol if needed for breakthrough pain. Continue heating pad -- 10-15 minute intervals.  Let us know if things are not calming down.

## 2019-02-06 NOTE — Progress Notes (Signed)
I have discussed the procedure for the virtual visit with the patient who has given consent to proceed with assessment and treatment.    S , CMA     

## 2019-02-09 ENCOUNTER — Other Ambulatory Visit: Payer: Self-pay

## 2019-02-09 ENCOUNTER — Encounter: Payer: Self-pay | Admitting: Physician Assistant

## 2019-02-09 ENCOUNTER — Ambulatory Visit (INDEPENDENT_AMBULATORY_CARE_PROVIDER_SITE_OTHER): Payer: 59 | Admitting: Physician Assistant

## 2019-02-09 VITALS — BP 140/84 | HR 61 | Temp 99.0°F | Resp 16 | Ht 67.0 in | Wt 271.0 lb

## 2019-02-09 DIAGNOSIS — M5416 Radiculopathy, lumbar region: Secondary | ICD-10-CM | POA: Diagnosis not present

## 2019-02-09 DIAGNOSIS — G6289 Other specified polyneuropathies: Secondary | ICD-10-CM

## 2019-02-09 DIAGNOSIS — M542 Cervicalgia: Secondary | ICD-10-CM

## 2019-02-09 LAB — TSH: TSH: 0.9 u[IU]/mL (ref 0.35–4.50)

## 2019-02-09 LAB — HEMOGLOBIN A1C: Hgb A1c MFr Bld: 6.4 % (ref 4.6–6.5)

## 2019-02-09 LAB — VITAMIN B12: Vitamin B-12: 273 pg/mL (ref 211–911)

## 2019-02-09 NOTE — Progress Notes (Signed)
Patient presents to clinic today c/o continued pain in her neck and legs. Patient with history of fibromyalgia seen recently for flare of her myalgias due to some physical stressors. Was started on regimen of steroids and muscle relaxants. Notes these symptoms are much improved overall. Is noting low back pain and issue with heaviness in her legs associated with intermittent numbness and tingling, especially in the feet bilaterally. Notes some mild decreased grip strength bilaterally but without proximal weakness or any numbness or tingling.   Past Medical History:  Diagnosis Date  . Anemia   . Anxiety   . ASCUS with positive high risk HPV 11/2014  . Asthma   . Bipolar 1 disorder ([place])   . GERD (gastroesophageal reflux disease)   . Heart murmur   . LGSIL (low grade squamous intraepithelial dysplasia) 11/2014   colposcopy biopsy.  recommend follow up pap in one year  . Pre-diabetes   . Prediabetes     Current Outpatient Medications on File Prior to Visit  Medication Sig Dispense Refill  . DULoxetine (CYMBALTA) 30 MG capsule Take 1 capsule (30 mg total) by mouth daily. 30 capsule 3  . fluticasone (FLONASE) 50 MCG/ACT nasal spray Place 2 sprays into both nostrils daily. 16 g 0  . levocetirizine (XYZAL) 5 MG tablet Take 1 tablet (5 mg total) by mouth every evening. 30 tablet 3  . methylPREDNISolone (MEDROL DOSEPAK) 4 MG TBPK tablet Take following package directions 21 tablet 0  . pantoprazole (PROTONIX) 40 MG tablet Take 1 tablet (40 mg total) by mouth daily. 30 tablet 0  . tiZANidine (ZANAFLEX) 2 MG tablet Take 1 tablet (2 mg total) by mouth at bedtime as needed for muscle spasms. 30 tablet 0   No current facility-administered medications on file prior to visit.     Allergies  Allergen Reactions  . Penicillins Hives    Has patient had a PCN reaction causing immediate rash, facial/tongue/throat swelling, SOB or lightheadedness with hypotension:no Has patient had a PCN reaction causing  severe rash involving mucus membranes or skin necrosis: Yes Has patient had a PCN reaction that required hospitalization No Has patient had a PCN reaction occurring within the last 10 years: No If all of the above answers are "NO", then may proceed with Cephalosporin use.     Family History  Problem Relation Age of Onset  . Lupus Mother   . Heart disease Mother   . Heart disease Father   . Diabetes Maternal Grandmother   . Heart disease Maternal Grandmother   . Hypertension Maternal Grandmother   . Prostate cancer Maternal Grandfather   . Alzheimer's disease Paternal Grandfather   . ADD / ADHD Son   . Colon cancer Neg Hx   . Stomach cancer Neg Hx     Social History   Socioeconomic History  . Marital status: Legally Separated    Spouse name: Not on file  . Number of children: 3  . Years of education: 25  . Highest education level: Not on file  Occupational History  . Occupation: Radio broadcast assistant  Social Needs  . Financial resource strain: Not on file  . Food insecurity    Worry: Not on file    Inability: Not on file  . Transportation needs    Medical: Not on file    Non-medical: Not on file  Tobacco Use  . Smoking status: Former Smoker    Types: Cigarettes  . Smokeless tobacco: Never Used  . Tobacco comment: not smoking  at all  Substance and Sexual Activity  . Alcohol use: Yes    Alcohol/week: 0.0 standard drinks  . Drug use: No  . Sexual activity: Yes    Birth control/protection: Surgical    Comment: intercourse age 6, sexual partners more than 5  Lifestyle  . Physical activity    Days per week: Not on file    Minutes per session: Not on file  . Stress: Not on file  Relationships  . Social Herbalist on phone: Not on file    Gets together: Not on file    Attends religious service: Not on file    Active member of club or organization: Not on file    Attends meetings of clubs or organizations: Not on file    Relationship status: Not on file   Other Topics Concern  . Not on file  Social History Narrative   Fun: Sleep    Review of Systems - See HPI.  All other ROS are negative.  BP 140/84   Pulse 61   Temp 99 F (37.2 C) (Temporal)   Resp 16   Ht 5\' 7"  (1.702 m)   Wt 271 lb (122.9 kg)   LMP  (LMP Unknown)   SpO2 98%   BMI 42.44 kg/m   Physical Exam Vitals signs reviewed.  Constitutional:      Appearance: Normal appearance.  HENT:     Head: Normocephalic and atraumatic.     Right Ear: Tympanic membrane normal.     Left Ear: Tympanic membrane normal.  Neck:     Musculoskeletal: Neck supple.  Cardiovascular:     Rate and Rhythm: Normal rate and regular rhythm.     Pulses: Normal pulses.     Heart sounds: Normal heart sounds.  Pulmonary:     Effort: Pulmonary effort is normal.  Musculoskeletal:     Right shoulder: Normal.     Left shoulder: Normal.     Right hip: She exhibits tenderness. She exhibits normal range of motion, normal strength and no bony tenderness.     Left hip: She exhibits tenderness. She exhibits normal range of motion, normal strength and no bony tenderness.     Lumbar back: She exhibits tenderness, pain and spasm. She exhibits normal range of motion and no bony tenderness.     Right hand: Normal sensation noted. Normal strength noted.     Left hand: Normal sensation noted. Normal strength noted.  Neurological:     Mental Status: She is alert.     Recent Results (from the past 2160 hour(s))  TSH     Status: None   Collection Time: 02/09/19 12:02 PM  Result Value Ref Range   TSH 0.90 0.35 - 4.50 uIU/mL  B12     Status: None   Collection Time: 02/09/19 12:02 PM  Result Value Ref Range   Vitamin B-12 273 211 - 911 pg/mL  Hemoglobin A1c     Status: None   Collection Time: 02/09/19 12:02 PM  Result Value Ref Range   Hgb A1c MFr Bld 6.4 4.6 - 6.5 %    Comment: Glycemic Control Guidelines for People with Diabetes:Non Diabetic:  <6%Goal of Therapy: <7%Additional Action Suggested:  >8%      Assessment/Plan: 1. Cervicalgia - DG Cervical Spine Complete; Future - Continue pain regimen. Will obtain x-rays today to further assess. Will alter regimen according to findings.  2. Lumbar radicular pain - DG Lumbar Spine Complete; Future - Continue pain regimen. Will  obtain x-rays today to further assess. Will alter regimen according to findings.  3. Other polyneuropathy Will check TSH, B12 and A1C to assess for causes of neuropathy. Subjective numbness as sensation and strength of bilateral upper and lower extremities are within normal limits.  - TSH - B12 - Hemoglobin A1c   Leeanne Rio, PA-C

## 2019-02-09 NOTE — Patient Instructions (Signed)
Please speak with Danae Chen or Elmyra Ricks to schedule your x-rays at our [facility] office.  Please go to the [facility] office for x-ray. We will call you with your results and alter treatment accordingly.   Tracy Valenzuela  [place], [place] [address]  Continue current regimen for now. We will make adjustments as soon as I have x-ray results.   I am taking you out of work for the next week.  I will send in all FMLA paperwork ASAP.   If anything acutely worsens, please go to the ER for assessment.

## 2019-02-10 ENCOUNTER — Other Ambulatory Visit: Payer: 59

## 2019-02-11 ENCOUNTER — Other Ambulatory Visit: Payer: 59

## 2019-02-11 ENCOUNTER — Other Ambulatory Visit: Payer: Self-pay

## 2019-02-11 ENCOUNTER — Ambulatory Visit (INDEPENDENT_AMBULATORY_CARE_PROVIDER_SITE_OTHER): Payer: 59

## 2019-02-11 DIAGNOSIS — M5416 Radiculopathy, lumbar region: Secondary | ICD-10-CM

## 2019-02-11 DIAGNOSIS — M542 Cervicalgia: Secondary | ICD-10-CM | POA: Diagnosis not present

## 2019-02-12 ENCOUNTER — Other Ambulatory Visit: Payer: Self-pay | Admitting: Emergency Medicine

## 2019-02-12 DIAGNOSIS — M797 Fibromyalgia: Secondary | ICD-10-CM

## 2019-02-12 DIAGNOSIS — M5410 Radiculopathy, site unspecified: Secondary | ICD-10-CM

## 2019-02-12 DIAGNOSIS — M47812 Spondylosis without myelopathy or radiculopathy, cervical region: Secondary | ICD-10-CM

## 2019-02-13 ENCOUNTER — Encounter: Payer: Self-pay | Admitting: Physician Assistant

## 2019-02-13 MED ORDER — PREGABALIN 100 MG PO CAPS: 100.0000 mg | ORAL_CAPSULE | Freq: Two times a day (BID) | ORAL | 2 refills | Status: DC

## 2019-02-13 NOTE — Progress Notes (Signed)
Would have her see Orthopedics.

## 2019-02-16 NOTE — Progress Notes (Signed)
Referral placed to Ortho

## 2019-02-17 ENCOUNTER — Telehealth: Payer: Self-pay | Admitting: Physician Assistant

## 2019-02-17 NOTE — Telephone Encounter (Signed)
Paperwork completed, signed and faxed today.

## 2019-02-17 NOTE — Telephone Encounter (Signed)
Made pt aware paperwork has been completed

## 2019-02-17 NOTE — Telephone Encounter (Signed)
I have placed FMLA forms in the bin upfront with a charge sheet.  °

## 2019-03-05 ENCOUNTER — Ambulatory Visit: Payer: 59 | Admitting: Surgery

## 2019-03-31 ENCOUNTER — Encounter: Payer: Self-pay | Admitting: Physician Assistant

## 2019-03-31 DIAGNOSIS — U071 COVID-19: Secondary | ICD-10-CM

## 2019-03-31 HISTORY — DX: COVID-19: U07.1

## 2019-04-03 ENCOUNTER — Encounter: Payer: Self-pay | Admitting: Physician Assistant

## 2019-04-03 DIAGNOSIS — U071 COVID-19: Secondary | ICD-10-CM | POA: Insufficient documentation

## 2019-04-06 ENCOUNTER — Encounter: Payer: Self-pay | Admitting: Physician Assistant

## 2019-05-12 ENCOUNTER — Encounter: Payer: Self-pay | Admitting: Physician Assistant

## 2019-05-12 DIAGNOSIS — F419 Anxiety disorder, unspecified: Secondary | ICD-10-CM

## 2019-05-12 DIAGNOSIS — F329 Major depressive disorder, single episode, unspecified: Secondary | ICD-10-CM

## 2019-05-12 DIAGNOSIS — F32A Depression, unspecified: Secondary | ICD-10-CM

## 2019-05-13 ENCOUNTER — Encounter: Payer: Self-pay | Admitting: Physician Assistant

## 2019-05-15 MED ORDER — CLONAZEPAM 0.5 MG PO TABS: 0.5000 mg | ORAL_TABLET | Freq: Two times a day (BID) | ORAL | 0 refills | Status: DC | PRN

## 2019-06-16 ENCOUNTER — Other Ambulatory Visit: Payer: Self-pay

## 2019-06-16 ENCOUNTER — Encounter: Payer: Self-pay | Admitting: Physician Assistant

## 2019-06-16 ENCOUNTER — Ambulatory Visit (INDEPENDENT_AMBULATORY_CARE_PROVIDER_SITE_OTHER): Payer: 59 | Admitting: Physician Assistant

## 2019-06-16 ENCOUNTER — Other Ambulatory Visit: Payer: Self-pay | Admitting: Physician Assistant

## 2019-06-16 DIAGNOSIS — J019 Acute sinusitis, unspecified: Secondary | ICD-10-CM

## 2019-06-16 DIAGNOSIS — L700 Acne vulgaris: Secondary | ICD-10-CM

## 2019-06-16 DIAGNOSIS — G4733 Obstructive sleep apnea (adult) (pediatric): Secondary | ICD-10-CM

## 2019-06-16 DIAGNOSIS — B9789 Other viral agents as the cause of diseases classified elsewhere: Secondary | ICD-10-CM

## 2019-06-16 DIAGNOSIS — M797 Fibromyalgia: Secondary | ICD-10-CM

## 2019-06-16 DIAGNOSIS — R7303 Prediabetes: Secondary | ICD-10-CM

## 2019-06-16 MED ORDER — LEVOCETIRIZINE DIHYDROCHLORIDE 5 MG PO TABS: 5.0000 mg | ORAL_TABLET | Freq: Every evening | ORAL | 3 refills | Status: DC

## 2019-06-16 MED ORDER — FLUTICASONE PROPIONATE 50 MCG/ACT NA SUSP: 2.0000 | Freq: Every day | NASAL | 0 refills | Status: DC

## 2019-06-16 MED ORDER — CLINDAMYCIN PHOS-BENZOYL PEROX 1-5 % EX GEL: Freq: Two times a day (BID) | CUTANEOUS | 0 refills | Status: DC

## 2019-06-16 NOTE — Addendum Note (Signed)
Addended by: Leonidas Romberg on: 06/16/2019 04:19 PM   Modules accepted: Orders

## 2019-06-16 NOTE — Progress Notes (Signed)
Virtual Visit via Video   I connected with patient on 06/16/19 at  8:30 AM EST by a video enabled telemedicine application and verified that I am speaking with the correct person using two identifiers.  Location patient: Home Location provider: [facility], Office Persons participating in the virtual visit: Patient, Provider, [place] (Patina Moore)  I discussed the limitations of evaluation and management by telemedicine and the availability of in person appointments. The patient expressed understanding and agreed to proceed.  Subjective:   HPI:   Patient presents via doxy doxy.me today complaining of 3 days of nasal congestion with sinus pressure, runny nose, postnasal drainage and sore throat.  Patient endorses a very mild dry cough. Is non-productive. Denies fevers but has had some mild chills starting Saturday. Denies loss of taste of smell. Has noted some diarrhea with this.  Has not taken anything for her symptoms. Has been trying to keep hydrated and getting plenty of rest. Does work in the nursing home. Has had 1st COVID vaccine. Not due for the second until March.   Patient also endorses a flareup of cystic acne around her chin for the past 3 weeks.  Has been trying to keep the area clean.  Has not applied anything to the area over-the-counter she states she is not sure what to apply.  ROS:   See pertinent positives and negatives per HPI.  Patient Active Problem List   Diagnosis Date Noted  . COVID-19 virus infection 04/03/2019  . Obstructive sleep apnea of adult 05/15/2018  . Obesity (BMI 35.0-39.9 without comorbidity) 05/15/2018  . Hypothyroidism 05/15/2018  . Fibromyalgia 09/27/2017  . Primary osteoarthritis of both knees 09/27/2017  . Vitamin D deficiency 09/27/2017  . Leiomyoma 10/30/2016  . Prediabetes 08/21/2016  . Gastroesophageal reflux disease 08/21/2016  . Thornwaldt's cyst 05/29/2016  . Morbid obesity ([place]) 11/09/2015  . Bipolar 1 disorder, mixed,  moderate ([place]) 09/30/2015  . Decreased attention Span 09/30/2015  . Iron deficiency anemia 10/19/2014    Social History   Tobacco Use  . Smoking status: Former Smoker    Types: Cigarettes  . Smokeless tobacco: Never Used  . Tobacco comment: not smoking at all  Substance Use Topics  . Alcohol use: Yes    Alcohol/week: 0.0 standard drinks    Current Outpatient Medications:  .  clonazePAM (KLONOPIN) 0.5 MG tablet, Take 1 tablet (0.5 mg total) by mouth 2 (two) times daily as needed for anxiety., Disp: 15 tablet, Rfl: 0 .  DULoxetine (CYMBALTA) 30 MG capsule, Take 1 capsule (30 mg total) by mouth daily., Disp: 30 capsule, Rfl: 3 .  fluticasone (FLONASE) 50 MCG/ACT nasal spray, Place 2 sprays into both nostrils daily., Disp: 16 g, Rfl: 0 .  levocetirizine (XYZAL) 5 MG tablet, Take 1 tablet (5 mg total) by mouth every evening., Disp: 30 tablet, Rfl: 3 .  pantoprazole (PROTONIX) 40 MG tablet, Take 1 tablet (40 mg total) by mouth daily., Disp: 30 tablet, Rfl: 0 .  pregabalin (LYRICA) 100 MG capsule, Take 1 capsule (100 mg total) by mouth 2 (two) times daily., Disp: 60 capsule, Rfl: 2 .  tiZANidine (ZANAFLEX) 2 MG tablet, Take 1 tablet (2 mg total) by mouth at bedtime as needed for muscle spasms. (Patient not taking: Reported on 06/16/2019), Disp: 30 tablet, Rfl: 0  Allergies  Allergen Reactions  . Penicillins Hives    Has patient had a PCN reaction causing immediate rash, facial/tongue/throat swelling, SOB or lightheadedness with hypotension:no Has patient had a PCN reaction causing  severe rash involving mucus membranes or skin necrosis: Yes Has patient had a PCN reaction that required hospitalization No Has patient had a PCN reaction occurring within the last 10 years: No If all of the above answers are "NO", then may proceed with Cephalosporin use.     Objective:   LMP  (LMP Unknown)   Patient is well-developed, well-nourished in no acute distress.  Resting comfortably at home.   Head is normocephalic, atraumatic.  No labored breathing.  Speech is clear and coherent with logical content.  Patient is alert and oriented at baseline.   Assessment and Plan:   1. Acute viral sinusitis Rx Flonase.  Increase fluids.  Rest.  Saline nasal spray.  Probiotic.  Mucinex as directed.  Humidifier in bedroom. Restart Xyzal.  Call or return to clinic if symptoms are not improving. Complete 2nd COVID vaccine when due in March.  - fluticasone (FLONASE) 50 MCG/ACT nasal spray; Place 2 sprays into both nostrils daily.  Dispense: 16 g; Refill: 0 - levocetirizine (XYZAL) 5 MG tablet; Take 1 tablet (5 mg total) by mouth every evening.  Dispense: 30 tablet; Refill: 3  2. Cystic acne Skin hygiene reviewed with patient.  We will have her start a Cetaphil or CeraVe, nonscented and nondyed facial cleanser.  Avoid touching the face.  Rx BenzaClin to apply twice daily over the next few weeks.  She is to follow-up with [place] in a few weeks to let us know how things are going. - clindamycin-benzoyl peroxide (BENZACLIN) gel; Apply topically 2 (two) times daily.  Dispense: 25 g; Refill: 0    Leeanne Rio, PA-C 06/16/2019

## 2019-06-16 NOTE — Progress Notes (Signed)
I have discussed the procedure for the virtual visit with the patient who has given consent to proceed with assessment and treatment.    S , CMA     

## 2019-06-16 NOTE — Patient Instructions (Addendum)
Instructions sent to MyChart  Please keep well-hydrated and get plenty of rest. I want you to stay at home until you are feeling better. You will receive your work note and just a minute. Please restart your Xyzal and Flonase. Start a saline nasal rinse once to twice daily. Delsym or Mucinex-DM over-the-counter for cough. Consider starting zinc and OTC vitamin C. Follow-up with me if symptoms not improving, if anything worsens or new symptoms develop.  For the acne, please keep the skin clean and dry. Try a nonscented, nondyed gentle facial cleanser like Cetaphil or CeraVe. Avoid touching the face. I do feel that mask wearing is likely triggering this outbreak. Use the BenzaClin that I have sent in twice daily as directed for 2 weeks. Call or message me to let me know how this is working for you.   We will make further adjustments at that time.  Hang in there!

## 2019-06-23 ENCOUNTER — Other Ambulatory Visit: Payer: Self-pay | Admitting: Emergency Medicine

## 2019-06-23 DIAGNOSIS — L700 Acne vulgaris: Secondary | ICD-10-CM

## 2019-06-23 MED ORDER — CLINDAMYCIN PHOS-BENZOYL PEROX 1.2-5 % EX GEL: CUTANEOUS | 1 refills | Status: DC

## 2019-07-03 ENCOUNTER — Other Ambulatory Visit: Payer: Self-pay | Admitting: Physician Assistant

## 2019-07-03 DIAGNOSIS — F32A Depression, unspecified: Secondary | ICD-10-CM

## 2019-07-03 DIAGNOSIS — F329 Major depressive disorder, single episode, unspecified: Secondary | ICD-10-CM

## 2019-07-03 DIAGNOSIS — F419 Anxiety disorder, unspecified: Secondary | ICD-10-CM

## 2019-07-06 NOTE — Telephone Encounter (Signed)
Clonazepam last rx 05/15/19#15 LOV: 06/16/19 acute visit

## 2019-07-09 ENCOUNTER — Other Ambulatory Visit: Payer: Self-pay

## 2019-07-09 ENCOUNTER — Encounter: Payer: Self-pay | Admitting: Physician Assistant

## 2019-07-09 ENCOUNTER — Ambulatory Visit (INDEPENDENT_AMBULATORY_CARE_PROVIDER_SITE_OTHER): Payer: 59 | Admitting: Physician Assistant

## 2019-07-09 DIAGNOSIS — F411 Generalized anxiety disorder: Secondary | ICD-10-CM

## 2019-07-09 DIAGNOSIS — F331 Major depressive disorder, recurrent, moderate: Secondary | ICD-10-CM

## 2019-07-09 DIAGNOSIS — F419 Anxiety disorder, unspecified: Secondary | ICD-10-CM

## 2019-07-09 DIAGNOSIS — F32A Depression, unspecified: Secondary | ICD-10-CM

## 2019-07-09 DIAGNOSIS — M797 Fibromyalgia: Secondary | ICD-10-CM

## 2019-07-09 DIAGNOSIS — F329 Major depressive disorder, single episode, unspecified: Secondary | ICD-10-CM

## 2019-07-09 MED ORDER — CLONAZEPAM 0.5 MG PO TABS: 0.5000 mg | ORAL_TABLET | Freq: Two times a day (BID) | ORAL | 0 refills | Status: DC | PRN

## 2019-07-09 MED ORDER — DULOXETINE HCL 60 MG PO CPEP: 60.0000 mg | ORAL_CAPSULE | Freq: Every day | ORAL | 3 refills | Status: DC

## 2019-07-09 NOTE — Progress Notes (Signed)
I have discussed the procedure for the virtual visit with the patient who has given consent to proceed with assessment and treatment.   Pt is unable to get vitals during the visit   Tracy Valenzuela, [place]

## 2019-07-09 NOTE — Progress Notes (Signed)
Virtual Visit via Video   I connected with patient on 07/09/19 at  8:00 AM EST by a video enabled telemedicine application and verified that I am speaking with the correct person using two identifiers.  Location patient: Home Location provider: [facility], Office Persons participating in the virtual visit: Patient, Provider, [place] (Patina Moore)  I discussed the limitations of evaluation and management by telemedicine and the availability of in person appointments. The patient expressed understanding and agreed to proceed.  Subjective:   HPI:   Patient presents via Doxy.Me today to discuss anxiety/fibromyalgia. Patient notes over the past week having increased anxiety -- "mini meltdowns" -- feels related to work. Can happen at work or at home but usually before going to work. A lot of stressors at work -- bad work environment. Is actively looking for a new position. Notes anxiety has impact on her fibromyalgia. Notes she is sleeping more and notes some anhedonia and decreased appetite. Denies SI/HI.Taking her Cymbalta and Lyrica as directed.   ROS:   See pertinent positives and negatives per HPI.  Patient Active Problem List   Diagnosis Date Noted  . COVID-19 virus infection 04/03/2019  . Obstructive sleep apnea of adult 05/15/2018  . Obesity (BMI 35.0-39.9 without comorbidity) 05/15/2018  . Hypothyroidism 05/15/2018  . Fibromyalgia 09/27/2017  . Primary osteoarthritis of both knees 09/27/2017  . Vitamin D deficiency 09/27/2017  . Leiomyoma 10/30/2016  . Prediabetes 08/21/2016  . Gastroesophageal reflux disease 08/21/2016  . Thornwaldt's cyst 05/29/2016  . Morbid obesity ([place]) 11/09/2015  . Bipolar 1 disorder, mixed, moderate ([place]) 09/30/2015  . Decreased attention Span 09/30/2015  . Iron deficiency anemia 10/19/2014    Social History   Tobacco Use  . Smoking status: Former Smoker    Types: Cigarettes  . Smokeless tobacco: Never Used  . Tobacco comment: not  smoking at all  Substance Use Topics  . Alcohol use: Yes    Alcohol/week: 0.0 standard drinks    Current Outpatient Medications:  .  Clindamycin-Benzoyl Per, Refr, gel, Apply topically twice a day, Disp: 45 g, Rfl: 1 .  clonazePAM (KLONOPIN) 0.5 MG tablet, Take 1 tablet by mouth twice daily as needed for anxiety, Disp: 15 tablet, Rfl: 0 .  DULoxetine (CYMBALTA) 30 MG capsule, Take 1 capsule (30 mg total) by mouth daily., Disp: 30 capsule, Rfl: 3 .  fluticasone (FLONASE) 50 MCG/ACT nasal spray, Place 2 sprays into both nostrils daily., Disp: 16 g, Rfl: 0 .  levocetirizine (XYZAL) 5 MG tablet, Take 1 tablet (5 mg total) by mouth every evening., Disp: 30 tablet, Rfl: 3 .  pantoprazole (PROTONIX) 40 MG tablet, Take 1 tablet (40 mg total) by mouth daily., Disp: 30 tablet, Rfl: 0 .  pregabalin (LYRICA) 100 MG capsule, Take 1 capsule (100 mg total) by mouth 2 (two) times daily., Disp: 60 capsule, Rfl: 2 .  tiZANidine (ZANAFLEX) 2 MG tablet, Take 1 tablet (2 mg total) by mouth at bedtime as needed for muscle spasms., Disp: 30 tablet, Rfl: 0  Allergies  Allergen Reactions  . Penicillins Hives    Has patient had a PCN reaction causing immediate rash, facial/tongue/throat swelling, SOB or lightheadedness with hypotension:no Has patient had a PCN reaction causing severe rash involving mucus membranes or skin necrosis: Yes Has patient had a PCN reaction that required hospitalization No Has patient had a PCN reaction occurring within the last 10 years: No If all of the above answers are "NO", then may proceed with Cephalosporin use.  Objective:   LMP  (LMP Unknown)   Patient is well-developed, well-nourished in no acute distress.  Resting comfortably at home.  Head is normocephalic, atraumatic.  No labored breathing.  Speech is clear and coherent with logical content.  Patient is alert and oriented at baseline.   Assessment and Plan:   1. Fibromyalgia 2. Moderate episode of recurrent  major depressive disorder ([place]) 3. Anxiety state After discussion of options we will increase her Cymbalta to 60 mg daily. Klonopin up to BID PRN. She is looking for another position. I do think this will help with her situational anxiety. Close follow-up scheduled for 3 weeks. Sooner if needed.     Leeanne Rio, PA-C 07/09/2019

## 2019-07-13 ENCOUNTER — Encounter: Payer: Self-pay | Admitting: Physician Assistant

## 2019-07-14 ENCOUNTER — Encounter: Payer: Self-pay | Admitting: Physician Assistant

## 2019-08-05 ENCOUNTER — Other Ambulatory Visit: Payer: Self-pay | Admitting: Physician Assistant

## 2019-08-05 DIAGNOSIS — J019 Acute sinusitis, unspecified: Secondary | ICD-10-CM

## 2019-08-05 DIAGNOSIS — M797 Fibromyalgia: Secondary | ICD-10-CM

## 2019-08-05 DIAGNOSIS — F329 Major depressive disorder, single episode, unspecified: Secondary | ICD-10-CM

## 2019-08-05 DIAGNOSIS — F419 Anxiety disorder, unspecified: Secondary | ICD-10-CM

## 2019-08-05 DIAGNOSIS — B9789 Other viral agents as the cause of diseases classified elsewhere: Secondary | ICD-10-CM

## 2019-08-05 DIAGNOSIS — K219 Gastro-esophageal reflux disease without esophagitis: Secondary | ICD-10-CM

## 2019-08-05 DIAGNOSIS — F32A Depression, unspecified: Secondary | ICD-10-CM

## 2019-08-05 MED ORDER — PANTOPRAZOLE SODIUM 40 MG PO TBEC: 40.0000 mg | DELAYED_RELEASE_TABLET | Freq: Every day | ORAL | 1 refills | Status: DC

## 2019-08-05 MED ORDER — FLUTICASONE PROPIONATE 50 MCG/ACT NA SUSP: 2.0000 | Freq: Every day | NASAL | 3 refills | Status: DC

## 2019-08-05 MED ORDER — CLONAZEPAM 0.5 MG PO TABS: 0.5000 mg | ORAL_TABLET | Freq: Two times a day (BID) | ORAL | 0 refills | Status: DC | PRN

## 2019-08-05 NOTE — Telephone Encounter (Signed)
Clonazepam last rx 07/09/19 #60 LOV: 07/09/19 Fibromyalgia  Tizanidine last rx 02/06/19 #30

## 2019-08-13 ENCOUNTER — Telehealth: Payer: Self-pay | Admitting: Physician Assistant

## 2019-08-13 NOTE — Telephone Encounter (Signed)
Left a vm message for the patient to call our office back.

## 2019-08-13 NOTE — Telephone Encounter (Signed)
LMOVM for patient to call back with reason for paperwork, if she has missed any days at work.

## 2019-08-13 NOTE — Telephone Encounter (Signed)
Received short-term paperwork for patient -- no charge sheet or reason for request, dates missed, etc. Can we contact patient for more information so we can determine if this can be completed. Her last appointment was ~ 1 month ago.

## 2019-08-14 ENCOUNTER — Telehealth: Payer: Self-pay | Admitting: Physician Assistant

## 2019-08-14 NOTE — Telephone Encounter (Signed)
Paperwork in your folder for review and completion

## 2019-08-14 NOTE — Telephone Encounter (Signed)
I have placed a Attending Statement in the bin upfront with a chargesheet.

## 2019-08-14 NOTE — Telephone Encounter (Signed)
Called and spoke with patient regarding her FMLA paperwork, She states it is for fibromyalgia. She states the dates are for 06/16/19 - 06/22/19. She states intermittent needs to be put on it because it was not on the last form and it was denied.

## 2019-08-18 NOTE — Telephone Encounter (Signed)
Patient called back regarding paperwork that was filled out. She states she will have her employer send over [place] paper.

## 2019-08-18 NOTE — Telephone Encounter (Signed)
Have not received FMLA paperwork. What was received was short-term disability which covers a continuous time-frame, not intermittent episodes of leave. If she is needing that updated she will need to have her employer send [place] FMLA forms. Short-term paperwork completed and will be submitted this morning.

## 2019-08-18 NOTE — Telephone Encounter (Signed)
Left a vm message for the patient to call the office back in regards to paperwork.

## 2019-08-19 ENCOUNTER — Encounter (INDEPENDENT_AMBULATORY_CARE_PROVIDER_SITE_OTHER): Payer: Self-pay | Admitting: Bariatrics

## 2019-08-19 ENCOUNTER — Ambulatory Visit (INDEPENDENT_AMBULATORY_CARE_PROVIDER_SITE_OTHER): Payer: 59 | Admitting: Bariatrics

## 2019-08-19 ENCOUNTER — Other Ambulatory Visit: Payer: Self-pay

## 2019-08-19 VITALS — BP 140/82 | HR 77 | Temp 98.4°F | Ht 66.0 in | Wt 266.0 lb

## 2019-08-19 DIAGNOSIS — R7303 Prediabetes: Secondary | ICD-10-CM

## 2019-08-19 DIAGNOSIS — D508 Other iron deficiency anemias: Secondary | ICD-10-CM

## 2019-08-19 DIAGNOSIS — R5383 Other fatigue: Secondary | ICD-10-CM | POA: Diagnosis not present

## 2019-08-19 DIAGNOSIS — Z9189 Other specified personal risk factors, not elsewhere classified: Secondary | ICD-10-CM | POA: Diagnosis not present

## 2019-08-19 DIAGNOSIS — E559 Vitamin D deficiency, unspecified: Secondary | ICD-10-CM | POA: Diagnosis not present

## 2019-08-19 DIAGNOSIS — E538 Deficiency of other specified B group vitamins: Secondary | ICD-10-CM

## 2019-08-19 DIAGNOSIS — F3289 Other specified depressive episodes: Secondary | ICD-10-CM | POA: Diagnosis not present

## 2019-08-19 DIAGNOSIS — G4733 Obstructive sleep apnea (adult) (pediatric): Secondary | ICD-10-CM

## 2019-08-19 DIAGNOSIS — E038 Other specified hypothyroidism: Secondary | ICD-10-CM

## 2019-08-19 DIAGNOSIS — R0602 Shortness of breath: Secondary | ICD-10-CM

## 2019-08-19 DIAGNOSIS — Z6841 Body Mass Index (BMI) 40.0 and over, adult: Secondary | ICD-10-CM | POA: Diagnosis not present

## 2019-08-19 DIAGNOSIS — Z0289 Encounter for other administrative examinations: Secondary | ICD-10-CM
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
## 2019-08-19 NOTE — Progress Notes (Signed)
Dear Tracy Noble, PA-C,   Thank you for referring Tracy Valenzuela to our clinic. The following note includes my evaluation and treatment recommendations.  Chief Complaint:   OBESITY Tracy Valenzuela (MR# [id number]) is a 44 y.o. female who presents for evaluation and treatment of obesity and related comorbidities. Current BMI is Body mass index is 42.93 kg/m.Marland Kitchen Tracy Valenzuela has been struggling with her weight for many years and has been unsuccessful in either losing weight, maintaining weight loss, or reaching her healthy weight goal.  Tracy Valenzuela is currently in the action stage of change and ready to dedicate time achieving and maintaining a healthier weight. Tracy Valenzuela is interested in becoming our patient and working on intensive lifestyle modifications including (but not limited to) diet and exercise for weight loss.  Tracy Valenzuela likes to [employer], but notes her energy is an issue. She snacks after dinner.  Tracy Valenzuela's habits were reviewed today and are as follows: Her family eats meals together, her desired weight loss is 66 lbs, she has been heavy most of her life, she started gaining weight as a child, her heaviest weight ever was 280 pounds, she is a picky eater and doesn't like to eat healthier foods, she craves chicken, she snacks frequently in the evenings, she skips meals daily, she is trying to follow a vegetarian diet, she frequently eats larger portions than normal and she struggles with emotional eating.  Depression Screen Tracy Valenzuela's Food and Mood (modified PHQ-9) score was 13.  Depression screen PHQ 2/9 08/19/2019  Decreased Interest 1  Down, Depressed, Hopeless 1  PHQ - 2 Score 2  Altered sleeping 1  Tired, decreased energy 3  Change in appetite 3  Feeling bad or failure about yourself  0  Trouble concentrating 3  Moving slowly or fidgety/restless 1  Suicidal thoughts 0  PHQ-9 Score 13  Difficult doing work/chores Not difficult at all   Subjective:   Other fatigue. Tracy Valenzuela denies daytime  somnolence and admits to waking up still tired. Tracy Valenzuela generally gets 6 hours of sleep per night, and states that she has generally restful sleep. Snoring is present. Apneic episodes are not present. Epworth Sleepiness Score is 7.  Shortness of breath on exertion. Tracy Valenzuela notes increasing shortness of breath with certain activities and seems to be worsening over time with weight gain. She notes getting out of breath sooner with activity than she used to. This has gotten worse recently. Tracy Valenzuela denies shortness of breath at rest or orthopnea.  Prediabetes. Tracy Valenzuela has a diagnosis of prediabetes based on her elevated HgA1c and was informed this puts her at greater risk of developing diabetes. She continues to work on diet and exercise to decrease her risk of diabetes. She denies nausea or hypoglycemia. Tracy Valenzuela reports a decreased appetite.  Lab Results  Component Value Date   HGBA1C 6.4 02/09/2019   No results found for: INSULIN  Vitamin D deficiency. Tracy Valenzuela is not on Vitamin D at this time.  Other specified hypothyroidism. Tracy Valenzuela is on no medication.   Lab Results  Component Value Date   TSH 0.90 02/09/2019   OSA (obstructive sleep apnea). Tracy Valenzuela is not on CPAP.  B12 nutritional deficiency. Tracy Valenzuela is not taking B12 supplementation.  Other iron deficiency anemia. Tracy Valenzuela is not taking iron. She is having fatigue/sleepy.   CBC Latest Ref Rng & Units 11/18/2017 05/03/2017 04/29/2017  WBC 4.0 - 10.5 K/uL 7.7 4.1 5.2  Hemoglobin 12.0 - 15.0 g/dL 11.0(L) 11.6(L) 10.9(L)  Hematocrit 36.0 - 46.0 % 34.2(L) 35.5(L) 33.9(L)  Platelets  150 - 400 K/uL 283 304.0 288   Lab Results  Component Value Date   IRON 82 05/03/2017   Lab Results  Component Value Date   VITAMINB12 273 02/09/2019   Other depression, with emotional eating. Tracy Valenzuela is struggling with emotional eating and using food for comfort to the extent that it is negatively impacting her health. She has been working on behavior modification  techniques to help reduce her emotional eating and has been somewhat successful. She shows no sign of suicidal or homicidal ideations.  At risk for diabetes mellitus. Tracy Valenzuela is at higher than average risk for developing diabetes due to her obesity and prediabetes.   Assessment/Plan:   Other fatigue. Tracy Valenzuela does feel that her weight is causing her energy to be lower than it should be. Fatigue may be related to obesity, depression or many other causes. Labs will be ordered, and in the meanwhile, Tracy Valenzuela will focus on self care including making healthy food choices, increasing physical activity and focusing on stress reduction. EKG 12-Lead, CBC with Differential/Platelet, T3, T4, free, TSH testing ordered today.  Shortness of breath on exertion. Tracy Valenzuela does feel that she gets out of breath more easily that she used to when she exercises. Tracy Valenzuela's shortness of breath appears to be obesity related and exercise induced. She has agreed to work on weight loss and gradually increase exercise to treat her exercise induced shortness of breath. Will continue to monitor closely. Lipid Panel With LDL/HDL Ratio labs ordered today.  Prediabetes. Tracy Valenzuela will continue to work on weight loss, exercise, and decreasing simple carbohydrates to help decrease the risk of diabetes. Comprehensive metabolic panel, Hemoglobin A1c, Insulin, random labs ordered today.  Vitamin D deficiency. Low Vitamin D level contributes to fatigue and are associated with obesity, breast, and colon cancer. VITAMIN D 25 Hydroxy (Vit-D Deficiency, Fractures) level ordered today.  Other specified hypothyroidism. Patient with long-standing hypothyroidism, on levothyroxine therapy. She appears euthyroid. Orders and follow up as documented in patient record. Will check thyroid panel today.  Counseling . Good thyroid control is important for overall health. Supratherapeutic thyroid levels are dangerous and will not improve weight loss results. . The  correct way to take levothyroxine is fasting, with water, separated by at least 30 minutes from breakfast, and separated by more than 4 hours from calcium, iron, multivitamins, acid reflux medications (PPIs).   OSA (obstructive sleep apnea). Intensive lifestyle modifications are the first line treatment for this issue. We discussed several lifestyle modifications today and she will continue to work on diet, exercise and weight loss efforts. We will continue to monitor. Orders and follow up as documented in patient record. Will follow over time and check if needed.  Counseling  Sleep apnea is a condition in which breathing pauses or becomes shallow during sleep. This happens over and over during the night. This disrupts your sleep and keeps your body from getting the rest that it needs, which can cause tiredness and lack of energy (fatigue) during the day.  Sleep apnea treatment: If you were given a device to open your airway while you sleep, USE IT!  Sleep hygiene:   Limit or avoid alcohol, caffeinated beverages, and cigarettes, especially close to bedtime.   Do not eat a large meal or eat spicy foods right before bedtime. This can lead to digestive discomfort that can make it hard for you to sleep.  Keep a sleep diary to help you and your health care provider figure out what could be causing your insomnia.  Marland Kitchen  Make your bedroom a dark, comfortable place where it is easy to fall asleep. ? Put up shades or blackout curtains to block light from outside. ? Use a white noise machine to block noise. ? Keep the temperature cool. . Limit screen use before bedtime. This includes: ? Watching TV. ? Using your smartphone, tablet, or computer. . Stick to a routine that includes going to bed and waking up at the same times every day and night. This can help you fall asleep faster. Consider making a quiet activity, such as reading, part of your nighttime routine. . Try to avoid taking naps during the day  so that you sleep better at night. . Get out of bed if you are still awake after 15 minutes of trying to sleep. Keep the lights down, but try reading or doing a quiet activity. When you feel sleepy, go back to bed.  B12 nutritional deficiency. The diagnosis was reviewed with the patient. Counseling provided today, see below. We will continue to monitor. Orders and follow up as documented in patient record. Will check B12 level today.  Counseling . The body needs vitamin B12: to make red blood cells; to make DNA; and to help the nerves work properly so they can carry messages from the brain to the body.  . The main causes of vitamin B12 deficiency include dietary deficiency, digestive diseases, pernicious anemia, and having a surgery in which part of the stomach or small intestine is removed.  . Certain medicines can make it harder for the body to absorb vitamin B12. These medicines include: heartburn medications; some antibiotics; some medications used to treat diabetes, gout, and high cholesterol.  . In some cases, there are no symptoms of this condition. If the condition leads to anemia or nerve damage, various symptoms can occur, such as weakness or fatigue, shortness of breath, and numbness or tingling in your hands and feet.   . Treatment:  o May include taking vitamin B12 supplements.  o Avoid alcohol.  o Eat lots of healthy foods that contain vitamin B12: - Beef, pork, chicken, [place], and organ meats, such as liver.  - Seafood: This includes clams, rainbow trout, salmon, tuna, and haddock.  - Eggs.  - Cereal and dairy products that are fortified: This means that vitamin B12 has been added to the food.   Other iron deficiency anemia. Orders and follow up as documented in patient record. Will check CBC today.  Counseling . Iron is essential for our bodies to make red blood cells.  Reasons that someone may be deficient include: an iron-deficient diet (more likely in those following vegan  or vegetarian diets), women with heavy menses, patients with GI disorders or poor absorption, patients that have had bariatric surgery, frequent blood donors, patients with cancer, and patients with heart disease.   Marland Kitchen An iron supplement has been recommended. This is found over-the-counter.  Tracy Valenzuela foods include dark leafy greens, red and white meats, eggs, seafood, and beans.   . Certain foods and drinks prevent your body from absorbing iron properly. Avoid eating these foods in the same meal as iron-rich foods or with iron supplements. These foods include: coffee, black tea, and red wine; milk, dairy products, and foods that are high in calcium; beans and soybeans; whole grains.  . Constipation can be a side effect of iron supplementation. Increased water and fiber intake are helpful. Water goal: > 2 liters/day. Fiber goal: > 25 grams/day.  Other depression, with emotional eating. Behavior modification  techniques were discussed today to help Taeisha deal with her emotional/non-hunger eating behaviors.  Orders and follow up as documented in patient record. Will refer to Dr. Mallie Mussel, our bariatric psychologist, for evaluation.  At risk for diabetes mellitus. Koriann was given approximately 15 minutes of diabetes education and counseling today. We discussed intensive lifestyle modifications today with an emphasis on weight loss as well as increasing exercise and decreasing simple carbohydrates in her diet. We also reviewed medication options with an emphasis on risk versus benefit of those discussed.   Repetitive spaced learning was employed today to elicit superior memory formation and behavioral change.  Class 3 severe obesity with serious comorbidity and body mass index (BMI) of 40.0 to 44.9 in adult, unspecified obesity type ([place]).  Tracy Valenzuela is currently in the action stage of change and her goal is to continue with weight loss efforts. I recommend Gyselle begin the structured treatment plan as  follows:  She has agreed to the Category 3 Plan.  She will work on meal planning and will not skip meals.  We independently reviewed with the patient labs from 11/18/2017 including CMP, CBC and labs from 02/09/2019 including A1c and BMP.  Exercise goals: All adults should avoid inactivity. Some physical activity is better than none, and adults who participate in any amount of physical activity gain some health benefits.   Behavioral modification strategies: increasing lean protein intake, decreasing simple carbohydrates, increasing vegetables, increasing water intake, decreasing eating out, no skipping meals, meal planning and cooking strategies, keeping healthy foods in the home and planning for success.  She was informed of the importance of frequent follow-up visits to maximize her success with intensive lifestyle modifications for her multiple health conditions. She was informed we would discuss her lab results at her next visit unless there is a critical issue that needs to be addressed sooner. Shiyan agreed to keep her next visit at the agreed upon time to discuss these results.  Objective:   Blood pressure 140/82, pulse 77, temperature 98.4 F (36.9 C), height 5\' 6"  (1.676 m), weight 266 lb (120.7 kg), SpO2 96 %. Body mass index is 42.93 kg/m.  EKG: Sinus  Bradycardia with a rate of 56 BPM. Anterior infarct - age undetermined. Abnormal.    Indirect Calorimeter completed today shows a VO2 of 284 and a REE of 1979.  Her calculated basal metabolic rate is [id number] thus her basal metabolic rate is better than expected.  General: Cooperative, alert, well developed, in no acute distress. HEENT: Conjunctivae and lids unremarkable. Cardiovascular: Regular rhythm.  Lungs: Normal work of breathing. Neurologic: No focal deficits.   Lab Results  Component Value Date   CREATININE 0.81 11/18/2017   BUN 7 11/18/2017   NA 138 11/18/2017   K 3.7 11/18/2017   CL 102 11/18/2017   CO2 23  11/18/2017   Lab Results  Component Value Date   ALT 11 05/03/2017   AST 11 05/03/2017   ALKPHOS 68 05/03/2017   BILITOT 0.3 05/03/2017   Lab Results  Component Value Date   HGBA1C 6.4 02/09/2019   HGBA1C 6.2 05/03/2017   HGBA1C 6.3 08/21/2016   HGBA1C 6.0 11/09/2015   HGBA1C  10/12/2008    6.1 (NOTE) The ADA recommends the following therapeutic goal for glycemic control related to Hgb A1c measurement: Goal of therapy: <6.5 Hgb A1c  Reference: American Diabetes Association: Clinical Practice Recommendations 2010, Diabetes Care, 2010, 33: (Suppl  1).   No results found for: INSULIN Lab Results  Component Value  Date   TSH 0.90 02/09/2019   Lab Results  Component Value Date   CHOL  10/12/2008    156        ATP III CLASSIFICATION:  <200     mg/dL   Desirable  200-239  mg/dL   Borderline High  >=240    mg/dL   High          HDL 29 (L) 10/12/2008   LDLCALC (H) 10/12/2008    107        Total Cholesterol/HDL:CHD Risk Coronary Heart Disease Risk Table                     Men   Women  1/2 Average Risk   3.4   3.3  Average Risk       5.0   4.4  2 X Average Risk   9.6   7.1  3 X Average Risk  23.4   11.0        Use the calculated Patient Ratio above and the CHD Risk Table to determine the patient's CHD Risk.        ATP III CLASSIFICATION (LDL):  <100     mg/dL   Optimal  100-129  mg/dL   Near or Above                    Optimal  130-159  mg/dL   Borderline  160-189  mg/dL   High  >190     mg/dL   Very High   TRIG 101 10/12/2008   CHOLHDL 5.4 10/12/2008   Lab Results  Component Value Date   WBC 7.7 11/18/2017   HGB 11.0 (L) 11/18/2017   HCT 34.2 (L) 11/18/2017   MCV 89.8 11/18/2017   PLT 283 11/18/2017   Lab Results  Component Value Date   IRON 82 05/03/2017   Attestation Statements:   Reviewed by clinician on day of visit: allergies, medications, problem list, medical history, surgical history, family history, social history, and previous encounter  notes.  Migdalia Dk, am acting as Location manager for [employer], DO   I have reviewed the above documentation for accuracy and completeness, and I agree with the above. Jearld Lesch, DO

## 2019-08-20 ENCOUNTER — Encounter (INDEPENDENT_AMBULATORY_CARE_PROVIDER_SITE_OTHER): Payer: Self-pay | Admitting: Bariatrics

## 2019-08-20 DIAGNOSIS — E7800 Pure hypercholesterolemia, unspecified: Secondary | ICD-10-CM | POA: Insufficient documentation

## 2019-08-20 LAB — COMPREHENSIVE METABOLIC PANEL
ALT: 16 IU/L (ref 0–32)
AST: 20 IU/L (ref 0–40)
Albumin/Globulin Ratio: 1.2 (ref 1.2–2.2)
Albumin: 4.2 g/dL (ref 3.8–4.8)
Alkaline Phosphatase: 64 IU/L (ref 39–117)
BUN/Creatinine Ratio: 11 (ref 9–23)
BUN: 9 mg/dL (ref 6–24)
Bilirubin Total: 0.2 mg/dL (ref 0.0–1.2)
CO2: 24 mmol/L (ref 20–29)
Calcium: 9.5 mg/dL (ref 8.7–10.2)
Chloride: 104 mmol/L (ref 96–106)
Creatinine, Ser: 0.79 mg/dL (ref 0.57–1.00)
GFR calc Af Amer: 107 mL/min/{1.73_m2} (ref >59)
GFR calc non Af Amer: 93 mL/min/{1.73_m2} (ref >59)
Globulin, Total: 3.5 g/dL (ref 1.5–4.5)
Glucose: 105 mg/dL — ABNORMAL HIGH (ref 65–99)
Potassium: 4.3 mmol/L (ref 3.5–5.2)
Sodium: 139 mmol/L (ref 134–144)
Total Protein: 7.7 g/dL (ref 6.0–8.5)

## 2019-08-20 LAB — VITAMIN D 25 HYDROXY (VIT D DEFICIENCY, FRACTURES): Vit D, 25-Hydroxy: 26.8 ng/mL — ABNORMAL LOW (ref 30.0–100.0)

## 2019-08-20 LAB — CBC WITH DIFFERENTIAL/PLATELET
Basophils Absolute: 0 10*3/uL (ref 0.0–0.2)
Basos: 1 %
EOS (ABSOLUTE): 0.2 10*3/uL (ref 0.0–0.4)
Eos: 5 %
Hematocrit: 35.8 % (ref 34.0–46.6)
Hemoglobin: 11.9 g/dL (ref 11.1–15.9)
Immature Grans (Abs): 0 10*3/uL (ref 0.0–0.1)
Immature Granulocytes: 0 %
Lymphocytes Absolute: 1.6 10*3/uL (ref 0.7–3.1)
Lymphs: 46 %
MCH: 28.7 pg (ref 26.6–33.0)
MCHC: 33.2 g/dL (ref 31.5–35.7)
MCV: 86 fL (ref 79–97)
Monocytes Absolute: 0.3 10*3/uL (ref 0.1–0.9)
Monocytes: 8 %
Neutrophils Absolute: 1.4 10*3/uL (ref 1.4–7.0)
Neutrophils: 40 %
Platelets: 326 10*3/uL (ref 150–450)
RBC: 4.15 x10E6/uL (ref 3.77–5.28)
RDW: 12.8 % (ref 11.7–15.4)
WBC: 3.4 10*3/uL (ref 3.4–10.8)

## 2019-08-20 LAB — LIPID PANEL WITH LDL/HDL RATIO
Cholesterol, Total: 228 mg/dL — ABNORMAL HIGH (ref 100–199)
HDL: 49 mg/dL (ref >39)
LDL Chol Calc (NIH): 153 mg/dL — ABNORMAL HIGH (ref 0–99)
LDL/HDL Ratio: 3.1 ratio (ref 0.0–3.2)
Triglycerides: 145 mg/dL (ref 0–149)
VLDL Cholesterol Cal: 26 mg/dL (ref 5–40)

## 2019-08-20 LAB — VITAMIN B12: Vitamin B-12: 457 pg/mL (ref 232–1245)

## 2019-08-20 LAB — T4, FREE: Free T4: 0.87 ng/dL (ref 0.82–1.77)

## 2019-08-20 LAB — TSH: TSH: 1.79 u[IU]/mL (ref 0.450–4.500)

## 2019-08-20 LAB — HEMOGLOBIN A1C
Est. average glucose Bld gHb Est-mCnc: 126 mg/dL
Hgb A1c MFr Bld: 6 % — ABNORMAL HIGH (ref 4.8–5.6)

## 2019-08-20 LAB — T3: T3, Total: 91 ng/dL (ref 71–180)

## 2019-08-20 LAB — INSULIN, RANDOM: INSULIN: 22.5 u[IU]/mL (ref 2.6–24.9)

## 2019-08-31 ENCOUNTER — Telehealth (INDEPENDENT_AMBULATORY_CARE_PROVIDER_SITE_OTHER): Payer: 59 | Admitting: Psychology

## 2019-08-31 ENCOUNTER — Encounter (INDEPENDENT_AMBULATORY_CARE_PROVIDER_SITE_OTHER): Payer: Self-pay

## 2019-08-31 ENCOUNTER — Telehealth (INDEPENDENT_AMBULATORY_CARE_PROVIDER_SITE_OTHER): Payer: Self-pay | Admitting: Psychology

## 2019-08-31 NOTE — Telephone Encounter (Signed)
  Office: [phone]  /  Fax: [phone]  Date of Call: Aug 31, 2019  Time of Call: 11:02am Duration of Call: ~ 2 minutes Provider: Glennie Isle, PsyD  CONTENT: This provider called Tracy Valenzuela to check-in as she did not present for today's MyChart Video Visit appointment at 11:00am. She indicated she did not realize she would be meeting with a psychologist today and the duration of the appointment would be 45-60 minutes. As such, Sarde requested to reschedule today's new patient appointment. She acknowledged understanding she would have to pay the no show fee ($50) prior to rescheduling. Tishara noted she would pay the fee and reschedule when she comes to the clinic later this week for her appointment with Dr. Owens Shark. A brief risk assessment was completed. Seraphina denied experiencing suicidal and homicidal ideation, plan, and intent. She did not endorse any other safety concerns.   PLAN: Annecia stated she will pay the fee and reschedule at her appointment this week (Sep 02, 2019).

## 2019-09-02 ENCOUNTER — Encounter (INDEPENDENT_AMBULATORY_CARE_PROVIDER_SITE_OTHER): Payer: Self-pay

## 2019-09-02 ENCOUNTER — Ambulatory Visit (INDEPENDENT_AMBULATORY_CARE_PROVIDER_SITE_OTHER): Payer: 59 | Admitting: Bariatrics

## 2019-09-02 NOTE — Telephone Encounter (Signed)
Yes was completed 08/17/19 and was faxed in with confirmation received on 08/18/2019. I have the originals if they need to be resubmitted.

## 2019-09-02 NOTE — Telephone Encounter (Signed)
Has this paperwork been completed?

## 2019-09-11 ENCOUNTER — Encounter: Payer: Self-pay | Admitting: Physician Assistant

## 2019-09-11 ENCOUNTER — Telehealth (INDEPENDENT_AMBULATORY_CARE_PROVIDER_SITE_OTHER): Payer: 59 | Admitting: Physician Assistant

## 2019-09-11 ENCOUNTER — Other Ambulatory Visit: Payer: Self-pay

## 2019-09-11 DIAGNOSIS — J019 Acute sinusitis, unspecified: Secondary | ICD-10-CM

## 2019-09-11 DIAGNOSIS — B9689 Other specified bacterial agents as the cause of diseases classified elsewhere: Secondary | ICD-10-CM

## 2019-09-11 MED ORDER — DOXYCYCLINE HYCLATE 100 MG PO CAPS: 100.0000 mg | ORAL_CAPSULE | Freq: Two times a day (BID) | ORAL | 0 refills | Status: DC

## 2019-09-11 MED ORDER — PREGABALIN 100 MG PO CAPS: 100.0000 mg | ORAL_CAPSULE | Freq: Two times a day (BID) | ORAL | 2 refills | Status: DC

## 2019-09-11 NOTE — Progress Notes (Signed)
Virtual Visit via Video   I connected with patient on 09/11/19 at  8:00 AM EDT by a video enabled telemedicine application and verified that I am speaking with the correct person using two identifiers.  Location patient: Home Location provider: [facility], Office Persons participating in the virtual visit: Patient, Provider, [place] (Patina Moore)  I discussed the limitations of evaluation and management by telemedicine and the availability of in person appointments. The patient expressed understanding and agreed to proceed.  Subjective:   HPI:   Patient presents via [facility] today c/o 1+ week of sinus pressure, sinus headache with frontal and maxillary sinus pressure and pain. Denies fever, chills, aches. Denies chest congestion, SOB or chest pain. Notes fatigue. Notes ear pressure and ringing in the ears. Denies recent travel or sick contact. Denies loss of taste or smell. Has significant history of sinusitis, usually 1-2 x year. Notes this feels identical to previous episodes. .  ROS:   See pertinent positives and negatives per HPI.  Patient Active Problem List   Diagnosis Date Noted  . Elevated cholesterol 08/20/2019  . COVID-19 virus infection 04/03/2019  . Obstructive sleep apnea of adult 05/15/2018  . Obesity (BMI 35.0-39.9 without comorbidity) 05/15/2018  . Hypothyroidism 05/15/2018  . Fibromyalgia 09/27/2017  . Primary osteoarthritis of both knees 09/27/2017  . Vitamin D deficiency 09/27/2017  . Leiomyoma 10/30/2016  . Prediabetes 08/21/2016  . Gastroesophageal reflux disease 08/21/2016  . Thornwaldt's cyst 05/29/2016  . Morbid obesity ([place]) 11/09/2015  . Decreased attention Span 09/30/2015  . Iron deficiency anemia 10/19/2014    Social History   Tobacco Use  . Smoking status: Former Smoker    Types: Cigarettes  . Smokeless tobacco: Never Used  . Tobacco comment: not smoking at all  Substance Use Topics  . Alcohol use: Yes    Alcohol/week: 0.0  standard drinks    Current Outpatient Medications:  .  clonazePAM (KLONOPIN) 0.5 MG tablet, Take 1 tablet (0.5 mg total) by mouth 2 (two) times daily as needed. for anxiety, Disp: 60 tablet, Rfl: 0 .  DULoxetine (CYMBALTA) 60 MG capsule, Take 1 capsule (60 mg total) by mouth daily., Disp: 30 capsule, Rfl: 3 .  fluticasone (FLONASE) 50 MCG/ACT nasal spray, Place 2 sprays into both nostrils daily., Disp: 16 g, Rfl: 3 .  pregabalin (LYRICA) 100 MG capsule, Take 1 capsule (100 mg total) by mouth 2 (two) times daily., Disp: 60 capsule, Rfl: 2 .  doxycycline (VIBRAMYCIN) 100 MG capsule, Take 1 capsule (100 mg total) by mouth 2 (two) times daily., Disp: 20 capsule, Rfl: 0  Allergies  Allergen Reactions  . Penicillins Hives    Has patient had a PCN reaction causing immediate rash, facial/tongue/throat swelling, SOB or lightheadedness with hypotension:no Has patient had a PCN reaction causing severe rash involving mucus membranes or skin necrosis: Yes Has patient had a PCN reaction that required hospitalization No Has patient had a PCN reaction occurring within the last 10 years: No If all of the above answers are "NO", then may proceed with Cephalosporin use.     Objective:   LMP  (LMP Unknown)   Patient is well-developed, well-nourished in no acute distress.  Resting comfortably at home.  Head is normocephalic, atraumatic.  No labored breathing.  Speech is clear and coherent with logical content.  Patient is alert and oriented at baseline.   Assessment and Plan:   1. Acute bacterial sinusitis Rx Doxycycline 100 mg BID x 10 days.  Increase fluids.  Rest.  Saline nasal spray.  Probiotic.  Mucinex as directed.  Humidifier in bedroom. Continue Flonase. Stop Xyzal. Start Claritin-D OTC.  Call or return to clinic if symptoms are not improving.  - doxycycline (VIBRAMYCIN) 100 MG capsule; Take 1 capsule (100 mg total) by mouth 2 (two) times daily.  Dispense: 20 capsule; Refill: 0    Leeanne Rio, [place] 09/11/2019

## 2019-09-11 NOTE — Progress Notes (Signed)
I have discussed the procedure for the virtual visit with the patient who has given consent to proceed with assessment and treatment. Patient is unable to obtain any vital signs.  Fritz Pickerel, LPN

## 2019-09-11 NOTE — Patient Instructions (Signed)
Instructions sent to MyChart

## 2019-09-15 ENCOUNTER — Encounter: Payer: Self-pay | Admitting: Physician Assistant

## 2019-09-15 NOTE — Telephone Encounter (Signed)
LMOVM for patient to call back for current symptoms

## 2019-09-15 NOTE — Telephone Encounter (Signed)
If patient still having dizziness she needs repeat assessment. Are the other symptoms better?

## 2019-09-15 NOTE — Telephone Encounter (Signed)
LMOVM advising patient to call back ASAP given recent my chart message

## 2019-09-16 ENCOUNTER — Other Ambulatory Visit: Payer: Self-pay

## 2019-09-16 ENCOUNTER — Encounter (HOSPITAL_COMMUNITY): Payer: Self-pay | Admitting: Emergency Medicine

## 2019-09-16 ENCOUNTER — Emergency Department (HOSPITAL_COMMUNITY)
Admission: EM | Admit: 2019-09-16 | Discharge: 2019-09-16 | Disposition: A | Discharge status: Home / Self Care | Payer: 59 | Attending: Emergency Medicine | Admitting: Emergency Medicine

## 2019-09-16 ENCOUNTER — Encounter: Payer: Self-pay | Admitting: Physician Assistant

## 2019-09-16 DIAGNOSIS — R519 Headache, unspecified: Secondary | ICD-10-CM | POA: Diagnosis not present

## 2019-09-16 DIAGNOSIS — R0982 Postnasal drip: Secondary | ICD-10-CM | POA: Diagnosis not present

## 2019-09-16 DIAGNOSIS — E039 Hypothyroidism, unspecified: Secondary | ICD-10-CM | POA: Diagnosis not present

## 2019-09-16 DIAGNOSIS — Z79899 Other long term (current) drug therapy: Secondary | ICD-10-CM | POA: Insufficient documentation

## 2019-09-16 DIAGNOSIS — R112 Nausea with vomiting, unspecified: Secondary | ICD-10-CM | POA: Diagnosis not present

## 2019-09-16 DIAGNOSIS — Z87891 Personal history of nicotine dependence: Secondary | ICD-10-CM | POA: Diagnosis not present

## 2019-09-16 DIAGNOSIS — F319 Bipolar disorder, unspecified: Secondary | ICD-10-CM | POA: Insufficient documentation

## 2019-09-16 HISTORY — DX: Type 2 diabetes mellitus without complications: E11.9

## 2019-09-16 MED ORDER — ACETAMINOPHEN 500 MG PO TABS
1000.0000 mg | ORAL_TABLET | Freq: Once | ORAL | Status: AC
  Administered 2019-09-16: 1000 mg via ORAL
  Filled 2019-09-16: qty 2

## 2019-09-16 MED ORDER — METOCLOPRAMIDE HCL 5 MG/ML IJ SOLN
10.0000 mg | Freq: Once | INTRAMUSCULAR | Status: AC
  Administered 2019-09-16: 10 mg via INTRAVENOUS
  Filled 2019-09-16: qty 2

## 2019-09-16 MED ORDER — BUTALBITAL-APAP-CAFFEINE 50-325-40 MG PO TABS: 1.0000 | ORAL_TABLET | Freq: Four times a day (QID) | ORAL | 0 refills | Status: DC | PRN

## 2019-09-16 MED ORDER — DEXAMETHASONE SODIUM PHOSPHATE 10 MG/ML IJ SOLN
10.0000 mg | Freq: Once | INTRAMUSCULAR | Status: AC
  Administered 2019-09-16: 10 mg via INTRAVENOUS
  Filled 2019-09-16: qty 1

## 2019-09-16 MED ORDER — CLONAZEPAM 0.5 MG PO TBDP
0.5000 mg | ORAL_TABLET | Freq: Two times a day (BID) | ORAL | Status: DC
  Administered 2019-09-16: 0.5 mg via ORAL
  Filled 2019-09-16: qty 2

## 2019-09-16 MED ORDER — SODIUM CHLORIDE 0.9 % IV BOLUS
500.0000 mL | Freq: Once | INTRAVENOUS | Status: AC
  Administered 2019-09-16: 500 mL via INTRAVENOUS

## 2019-09-16 MED ORDER — DIPHENHYDRAMINE HCL 50 MG/ML IJ SOLN
12.5000 mg | Freq: Once | INTRAMUSCULAR | Status: AC
  Administered 2019-09-16: 12.5 mg via INTRAVENOUS
  Filled 2019-09-16: qty 1

## 2019-09-16 NOTE — ED Triage Notes (Signed)
Pt here from work with ems with HA, vomiting and htn. Denies hx of migraines. States she has anxiety.

## 2019-09-16 NOTE — Discharge Instructions (Addendum)
You were seen in the ER for headache  I suspect this is a tension type or sinus headache   Continue taking antibiotics prescribed to you by your doctor  Try ibuprofen or acetaminophen for headache.  Take fioricet prescribed to you for break through headache that does not respond to ibuprofen or acetaminophen.  Use flonase to help with sinus congestion that can lead to sinus pressure and headaches   Follow up with your primary care doctor if symptoms are not improving  Return to the ER if you have sudden, severe, headache with fever, neck rigidity, loss of vision or double vision, or any stroke symptoms (difficult with speech or balance, one sided weakness or drooping or numbness)

## 2019-09-16 NOTE — ED Provider Notes (Signed)
[facility] EMERGENCY DEPARTMENT Provider Note   CSN: [id number] Arrival date & time: 09/16/19  1223     History Chief Complaint  Patient presents with  . Migraine    Tracy Valenzuela is a 44 y.o. female with history of fibromyalgia, depression, anxiety, prediabetes presents to the ER for evaluation of headache. States this was initially mild and gradually worsened. Occurred while at work approximately 30 minutes prior to arrival. It is located in the top of her forehead and down her face. Described as throbbing like someone is punching her and pulsating. Associated with nausea and vomiting up to 4 times, facial pain. Reports recently being diagnosed with sinusitis and has been taking antibiotics since Friday. Reports the symptoms are not any better and continues to have nasal congestion, nasal mucus, cough and wheezing. Has not tried anything for her symptoms today. Denies previous history of headaches. Denies head trauma. No anticoagulants. Denies associated fever, vision changes like loss of vision or double vision or blurred vision, neck pain or stiffness. No unilateral weakness or numbness. Denies illicit drug or alcohol use.  HPI     Past Medical History:  Diagnosis Date  . Alcohol abuse   . Anemia   . Anxiety   . ASCUS with positive high risk HPV 11/2014  . Asthma   . Bipolar 1 disorder ([place])   . Constipation   . Depression   . Diabetes mellitus without complication ([place])   . Fibromyalgia   . GERD (gastroesophageal reflux disease)   . Heart murmur   . LGSIL (low grade squamous intraepithelial dysplasia) 11/2014   colposcopy biopsy.  recommend follow up pap in one year  . Obesity   . Pre-diabetes   . Prediabetes   . Vitamin D deficiency     Patient Active Problem List   Diagnosis Date Noted  . Elevated cholesterol 08/20/2019  . COVID-19 virus infection 04/03/2019  . Obstructive sleep apnea of adult 05/15/2018  . Obesity (BMI 35.0-39.9 without  comorbidity) 05/15/2018  . Hypothyroidism 05/15/2018  . Fibromyalgia 09/27/2017  . Primary osteoarthritis of both knees 09/27/2017  . Vitamin D deficiency 09/27/2017  . Leiomyoma 10/30/2016  . Prediabetes 08/21/2016  . Gastroesophageal reflux disease 08/21/2016  . Thornwaldt's cyst 05/29/2016  . Morbid obesity ([place]) 11/09/2015  . Decreased attention Span 09/30/2015  . Iron deficiency anemia 10/19/2014    Past Surgical History:  Procedure Laterality Date  . CARDIAC CATHETERIZATION    . CYSTOSCOPY  10/30/2016   Procedure: CYSTOSCOPY;  Surgeon: Anastasio Auerbach, MD;  Location: [facility];  Service: Gynecology;;  . LAPAROSCOPIC VAGINAL HYSTERECTOMY WITH SALPINGECTOMY Bilateral 10/30/2016   Procedure: LAPAROSCOPIC ASSISTED VAGINAL HYSTERECTOMY WITH SALPINGECTOMY;  Surgeon: Anastasio Auerbach, MD;  Location: [facility];  Service: Gynecology;  Laterality: Bilateral;  . TUBAL LIGATION       OB History    Gravida  3   Para  1   Term      Preterm  1   AB  1   Living  3     SAB  1   TAB      Ectopic  0   Multiple      Live Births           Obstetric Comments  Had a set of twins that were preterm (30 weeks)        Family History  Problem Relation Age of Onset  . Lupus Mother   . Heart disease Mother   . High  blood pressure Mother   . High Cholesterol Mother   . Thyroid disease Mother   . Cancer Mother   . Depression Mother   . Anxiety disorder Mother   . Sleep apnea Mother   . Alcoholism Mother   . Obesity Mother   . Heart disease Father   . Diabetes Maternal Grandmother   . Heart disease Maternal Grandmother   . Hypertension Maternal Grandmother   . Prostate cancer Maternal Grandfather   . Alzheimer's disease Paternal Grandfather   . ADD / ADHD Son   . Colon cancer Neg Hx   . Stomach cancer Neg Hx     Social History   Tobacco Use  . Smoking status: Former Smoker    Types: Cigarettes  . Smokeless tobacco: Never Used  . Tobacco comment: not smoking at  all  Substance Use Topics  . Alcohol use: Yes    Alcohol/week: 0.0 standard drinks  . Drug use: No    Home Medications Prior to Admission medications   Medication Sig Start Date End Date Taking? Authorizing Provider  clonazePAM (KLONOPIN) 0.5 MG tablet Take 1 tablet (0.5 mg total) by mouth 2 (two) times daily as needed. for anxiety 08/05/19  Yes Brunetta Jeans, PA-C  doxycycline (VIBRAMYCIN) 100 MG capsule Take 1 capsule (100 mg total) by mouth 2 (two) times daily. 09/11/19  Yes Brunetta Jeans, PA-C  DULoxetine (CYMBALTA) 60 MG capsule Take 1 capsule (60 mg total) by mouth daily. 07/09/19  Yes Brunetta Jeans, PA-C  fluticasone (FLONASE) 50 MCG/ACT nasal spray Place 2 sprays into both nostrils daily. 08/05/19  Yes Brunetta Jeans, PA-C  pregabalin (LYRICA) 100 MG capsule Take 1 capsule (100 mg total) by mouth 2 (two) times daily. 09/11/19  Yes Brunetta Jeans, PA-C  butalbital-acetaminophen-caffeine (FIORICET) [phone] MG tablet Take 1-2 tablets by mouth every 6 (six) hours as needed for headache. 09/16/19 09/15/20  Kinnie Feil, PA-C    Allergies    Penicillins  Review of Systems   Review of Systems  HENT: Positive for postnasal drip, rhinorrhea, sinus pressure and sinus pain.   Respiratory: Positive for cough and wheezing.   Gastrointestinal: Positive for nausea and vomiting.  Neurological: Positive for headaches.  All other systems reviewed and are negative.   Physical Exam Updated Vital Signs BP (!) 153/84   Pulse 68   Temp 98.5 F (36.9 C) (Oral)   Resp 18   Ht 5\' 6"  (1.676 m)   Wt 120.7 kg   LMP  (LMP Unknown)   SpO2 100%   BMI 42.95 kg/m   Physical Exam Constitutional:      Appearance: She is well-developed. She is not toxic-appearing.     Comments: NAD.  HENT:     Head: Normocephalic and atraumatic.      Comments: Tenderness through entire face even with light touch    Right Ear: External ear normal.     Left Ear: External ear normal.     Nose:  Nose normal. No septal deviation or mucosal edema.  Eyes:     General: Lids are normal.     Conjunctiva/sclera: Conjunctivae normal.     Comments: Unable to visualize back of eye  Neck:     Comments: No c spine spinous process or muscular tenderness  Full PROM of neck w/o rigidity  No meningeal signs  Cardiovascular:     Rate and Rhythm: Normal rate and regular rhythm.     Heart sounds: Normal heart sounds.  Pulmonary:     Effort: Pulmonary effort is normal.     Breath sounds: Normal breath sounds.  Lymphadenopathy:     Comments: No cervical adenopathy  Skin:    General: Skin is warm and dry.     Capillary Refill: Capillary refill takes less than 2 seconds.     Findings: No rash.  Neurological:     Mental Status: She is alert.     GCS: GCS eye subscore is 4. GCS verbal subscore is 5. GCS motor subscore is 6.     Comments: Alert and oriented to self, place, time and event.  Speech is fluent without aphasia. Strength 5/5 with hand grip and ankle F/E.   Sensation to light touch intact in hands and feet. Normal gait. No pronator drift.  Normal finger-to-nose and heel-to-shin test.  CN I and VIII not tested. CN II-XII intact bilaterally.   Psychiatric:        Speech: Speech normal.        Behavior: Behavior normal.        Thought Content: Thought content normal.        Judgment: Judgment normal.     ED Results / Procedures / Treatments   Labs (all labs ordered are listed, but only abnormal results are displayed) Labs Reviewed - No data to display  EKG EKG Interpretation  Date/Time:  Wednesday Sep 16 2019 12:34:16 EDT Ventricular Rate:  70 PR Interval:    QRS Duration: 103 QT Interval:  425 QTC Calculation: 459 R Axis:   14 Text Interpretation: Sinus rhythm Borderline repolarization abnormality Borderline ST elevation, lateral leads When compared to prior, t wave inversion in lead V3 now upright. No STEMI Confirmed by Antony Blackbird [phone]) on 09/16/2019 3:09:53  PM   Radiology No results found.  Procedures Procedures (including critical care time)  Medications Ordered in ED Medications  clonazePAM (KLONOPIN) disintegrating tablet 0.5 mg (0.5 mg Oral Given 09/16/19 1426)  acetaminophen (TYLENOL) tablet 1,000 mg (1,000 mg Oral Given 09/16/19 1334)  dexamethasone (DECADRON) injection 10 mg (10 mg Intravenous Given 09/16/19 1343)  metoCLOPramide (REGLAN) injection 10 mg (10 mg Intravenous Given 09/16/19 1344)  diphenhydrAMINE (BENADRYL) injection 12.5 mg (12.5 mg Intravenous Given 09/16/19 1343)  sodium chloride 0.9 % bolus 500 mL (0 mLs Intravenous Stopped 09/16/19 1534)    ED Course  I have reviewed the triage vital signs and the nursing notes.  Pertinent labs & imaging results that were available during my care of the patient were reviewed by me and considered in my medical decision making (see chart for details).    MDM Rules/Calculators/A&P                      44 year old female with recently diagnosed sinusitis on antibiotics presents with gradual onset, worsening frontal headache with facial pain.  States her sinusitis has not improved significantly.  On exam she has tenderness along frontal and maxillary sinuses.  Slightly hypertensive that I suspect is related to a pain response.  Patient is without high risk features of headaches like sudden or thunderclap headache, vision disturbances, AMS, seizure, fever, meningeal signs, trauma, anticoagulation, trauma.  No focal neuro deficits found on exam.  No pain over temporal arteries.  Patient was given migraine cocktail here with moderate improvement in pain.  She reports anxiety and requesting her klonopin prn, given. She feels comfortable being discharged.  Highest on DDX is tension type or sinusitis type headache.  I considered life-threatening process like meningitis,  intracranial bleed, CVA, dissection or temporal arteritis unlikely.  Recommended Tylenol, ibuprofen and Fioricet for  breakthrough or persistent headaches.  Also recommended Flonase to help with sinus pressure.  Recommended PCP follow-up in the next few days for reevaluation.  Strict return precautions given.  Patient is comfortable with the plan.  Final Clinical Impression(s) / ED Diagnoses Final diagnoses:  Bad headache    Rx / DC Orders ED Discharge Orders         Ordered    butalbital-acetaminophen-caffeine (FIORICET) 50-325-40 MG tablet  Every 6 hours PRN     09/16/19 1544           Kinnie Feil, [place] 09/16/19 1559    Tegeler, Gwenyth Allegra, MD 09/16/19 [phone]

## 2019-09-17 ENCOUNTER — Other Ambulatory Visit: Payer: Self-pay | Admitting: Physician Assistant

## 2019-09-17 DIAGNOSIS — F419 Anxiety disorder, unspecified: Secondary | ICD-10-CM

## 2019-09-17 DIAGNOSIS — F32A Depression, unspecified: Secondary | ICD-10-CM

## 2019-09-17 MED ORDER — CLONAZEPAM 0.5 MG PO TABS: 0.5000 mg | ORAL_TABLET | Freq: Two times a day (BID) | ORAL | 0 refills | Status: DC | PRN

## 2019-09-21 ENCOUNTER — Encounter: Payer: Self-pay | Admitting: Physician Assistant

## 2019-09-21 ENCOUNTER — Other Ambulatory Visit: Payer: Self-pay

## 2019-09-21 ENCOUNTER — Telehealth (INDEPENDENT_AMBULATORY_CARE_PROVIDER_SITE_OTHER): Payer: 59 | Admitting: Physician Assistant

## 2019-09-21 DIAGNOSIS — I10 Essential (primary) hypertension: Secondary | ICD-10-CM | POA: Diagnosis not present

## 2019-09-21 DIAGNOSIS — J0100 Acute maxillary sinusitis, unspecified: Secondary | ICD-10-CM | POA: Diagnosis not present

## 2019-09-21 MED ORDER — LEVOCETIRIZINE DIHYDROCHLORIDE 5 MG PO TABS: 5.0000 mg | ORAL_TABLET | Freq: Every evening | ORAL | 1 refills | Status: DC

## 2019-09-21 MED ORDER — HYDROCHLOROTHIAZIDE 25 MG PO TABS: 25.0000 mg | ORAL_TABLET | Freq: Every day | ORAL | 3 refills | Status: DC

## 2019-09-21 MED ORDER — POTASSIUM CHLORIDE CRYS ER 20 MEQ PO TBCR: 20.0000 meq | EXTENDED_RELEASE_TABLET | Freq: Every day | ORAL | 3 refills | Status: DC

## 2019-09-21 MED ORDER — MOXIFLOXACIN HCL 400 MG PO TABS: 400.0000 mg | ORAL_TABLET | Freq: Every day | ORAL | 0 refills | Status: DC

## 2019-09-21 NOTE — Progress Notes (Signed)
I have discussed the procedure for the virtual visit with the patient who has given consent to proceed with assessment and treatment.    S , CMA     

## 2019-09-21 NOTE — Progress Notes (Signed)
Virtual Visit via Video   I connected with patient on 09/21/19 at  2:30 PM EDT by a video enabled telemedicine application and verified that I am speaking with the correct person using two identifiers.  Location patient: Home Location provider: [facility], Office Persons participating in the virtual visit: Patient, Provider, [place] (Patina Moore)  I discussed the limitations of evaluation and management by telemedicine and the availability of in person appointments. The patient expressed understanding and agreed to proceed.  Subjective:   HPI:   Patient presents via [place] today for ER follow-up.  Patient presented to the [facility] emergency department on 09/16/2019 with complaints of severe headache.  Was noted to have elevated blood pressure at this visit.  Work-up included EKG with no alarming findings and examination.  Patient was given migraine cocktail with significant improvement in her headache.  Blood pressure improved during stay in the emergency room.  Was discharged home to follow-up with PCP.  Of note patient previously been seen by this provider via video visit on 09/11/2019 for bacterial sinusitis.  Was prescribed 10-day course of doxycycline which she has completed.  Patient has noted continued elevated blood pressures at home with intermittent headaches, described as a throbbing sensation mostly in the occipital region and sometimes on the top of her head.  Denies any vision changes, nausea or vomiting with headache.  Denies altered mental status.  Is trying to keep well-hydrated and rest.  Regarding sinus symptoms she continues to have significant nasal congestion with maxillary sinus pain, now bilateral.  Again denies fever, chills, aches.  Denies chest congestion, chest pain or shortness of breath.  Is continuing with her Flonase, Claritin and saline nasal rinses.  ROS:   See pertinent positives and negatives per HPI.  Patient Active Problem List   Diagnosis  Date Noted  . Elevated cholesterol 08/20/2019  . COVID-19 virus infection 04/03/2019  . Obstructive sleep apnea of adult 05/15/2018  . Obesity (BMI 35.0-39.9 without comorbidity) 05/15/2018  . Hypothyroidism 05/15/2018  . Fibromyalgia 09/27/2017  . Primary osteoarthritis of both knees 09/27/2017  . Vitamin D deficiency 09/27/2017  . Leiomyoma 10/30/2016  . Prediabetes 08/21/2016  . Gastroesophageal reflux disease 08/21/2016  . Thornwaldt's cyst 05/29/2016  . Morbid obesity ([place]) 11/09/2015  . Decreased attention Span 09/30/2015  . Iron deficiency anemia 10/19/2014    Social History   Tobacco Use  . Smoking status: Former Smoker    Types: Cigarettes  . Smokeless tobacco: Never Used  . Tobacco comment: not smoking at all  Substance Use Topics  . Alcohol use: Yes    Alcohol/week: 0.0 standard drinks    Current Outpatient Medications:  .  butalbital-acetaminophen-caffeine (FIORICET) 50-325-40 MG tablet, Take 1-2 tablets by mouth every 6 (six) hours as needed for headache., Disp: 20 tablet, Rfl: 0 .  clonazePAM (KLONOPIN) 0.5 MG tablet, Take 1 tablet (0.5 mg total) by mouth 2 (two) times daily as needed. for anxiety, Disp: 60 tablet, Rfl: 0 .  doxycycline (VIBRAMYCIN) 100 MG capsule, Take 1 capsule (100 mg total) by mouth 2 (two) times daily., Disp: 20 capsule, Rfl: 0 .  DULoxetine (CYMBALTA) 60 MG capsule, Take 1 capsule (60 mg total) by mouth daily., Disp: 30 capsule, Rfl: 3 .  fluticasone (FLONASE) 50 MCG/ACT nasal spray, Place 2 sprays into both nostrils daily., Disp: 16 g, Rfl: 3 .  pregabalin (LYRICA) 100 MG capsule, Take 1 capsule (100 mg total) by mouth 2 (two) times daily., Disp: 60 capsule, Rfl: 2  Allergies  Allergen Reactions  . Penicillins Hives    Has patient had a PCN reaction causing immediate rash, facial/tongue/throat swelling, SOB or lightheadedness with hypotension:no Has patient had a PCN reaction causing severe rash involving mucus membranes or skin necrosis:  Yes Has patient had a PCN reaction that required hospitalization No Has patient had a PCN reaction occurring within the last 10 years: No If all of the above answers are "NO", then may proceed with Cephalosporin use.     Objective:   LMP  (LMP Unknown)   Patient is well-developed, well-nourished in no acute distress.  Resting comfortably at home.  Head is normocephalic, atraumatic.  No labored breathing.  Speech is clear and coherent with logical content.  Patient is alert and oriented at baseline.   Assessment and Plan:   1. Essential hypertension Past few blood pressures have been consistently elevated.  Patient symptomatic with headache.  No other symptoms associated with elevated blood pressure present.  Will start patient on hydrochlorothiazide 25 mg once daily.  We will also start her on Klor-Con 20 mEq once daily.  Keep well-hydrated.  Follow DASH diet.  Handout provided.  She is to check blood pressure at home daily and record.  Follow-up in 7 to 10 days in office for repeat evaluation.  Strict ER precautions reviewed with patient.  2. Acute non-recurrent maxillary sinusitis Persistent despite doxycycline.  We will have her switch Claritin to Xyzal once daily.  Continue saline nasal rinses and Flonase.  Rx Avelox.  If not resolving will need imaging versus ENT assessment.  Patient voiced understanding and agreement with the plan.    Leeanne Rio, [place] 09/21/2019

## 2019-09-21 NOTE — Patient Instructions (Signed)
Instructions sent to MyChart

## 2019-09-29 ENCOUNTER — Encounter: Payer: Self-pay | Admitting: Physician Assistant

## 2019-09-29 ENCOUNTER — Ambulatory Visit: Payer: 59 | Admitting: Physician Assistant

## 2019-09-29 ENCOUNTER — Other Ambulatory Visit: Payer: Self-pay

## 2019-09-29 VITALS — BP 136/80 | HR 87 | Temp 97.7°F | Resp 16 | Ht 66.0 in | Wt 271.2 lb

## 2019-09-29 DIAGNOSIS — R5382 Chronic fatigue, unspecified: Secondary | ICD-10-CM | POA: Diagnosis not present

## 2019-09-29 DIAGNOSIS — J329 Chronic sinusitis, unspecified: Secondary | ICD-10-CM

## 2019-09-29 DIAGNOSIS — I10 Essential (primary) hypertension: Secondary | ICD-10-CM

## 2019-09-29 MED ORDER — FAMOTIDINE 40 MG PO TABS
40.0000 mg | ORAL_TABLET | Freq: Every day | ORAL | 1 refills | Status: DC
Start: 2019-09-29 — End: 2020-04-06

## 2019-09-29 NOTE — Patient Instructions (Signed)
Please keep well-hydrated and get plenty of rest.  Continue the saline nasal rinses and the Flonase. Make sure you are continuing a daily antihistamine (Claritin, Xyzal, Allegra).  Call your ENT provider to schedule an appointment. Due to ongoing issue you need further evaluation.   Please continue the Protonix and add on the Famotidine each night. Follow diet below.   Please go to the lab today for blood work.  I will call you with your results. We will alter treatment regimen(s) if indicated by your results.

## 2019-09-29 NOTE — Progress Notes (Signed)
Patient presents to clinic today to follow-up regarding sinus inflammation and blood pressure.   Patient recently completed 2 courses of antibiotic for suspected bacterial sinusitis. Notes still having continued sinus pressure, ear pressure/popping, fatigue. Denies fever, chills.  Denies sinus pain now.   In regards to BP, patient is currently on a regimen of hydrochlorothiazide 25 mg, taking daily as directed. Notes tolerating well. Patient denies chest pain, palpitations, lightheadedness, dizziness, vision changes or frequent headaches.  BP Readings from Last 3 Encounters:  09/29/19 136/80  09/16/19 (!) 153/84  08/19/19 140/82    Past Medical History:  Diagnosis Date  . Alcohol abuse   . Anemia   . Anxiety   . ASCUS with positive high risk HPV 11/2014  . Asthma   . Bipolar 1 disorder ([place])   . Constipation   . Depression   . Diabetes mellitus without complication ([place])   . Fibromyalgia   . GERD (gastroesophageal reflux disease)   . Heart murmur   . LGSIL (low grade squamous intraepithelial dysplasia) 11/2014   colposcopy biopsy.  recommend follow up pap in one year  . Obesity   . Pre-diabetes   . Prediabetes   . Vitamin D deficiency     Current Outpatient Medications on File Prior to Visit  Medication Sig Dispense Refill  . butalbital-acetaminophen-caffeine (FIORICET) 50-325-40 MG tablet Take 1-2 tablets by mouth every 6 (six) hours as needed for headache. 20 tablet 0  . clonazePAM (KLONOPIN) 0.5 MG tablet Take 1 tablet (0.5 mg total) by mouth 2 (two) times daily as needed. for anxiety 60 tablet 0  . DULoxetine (CYMBALTA) 60 MG capsule Take 1 capsule (60 mg total) by mouth daily. 30 capsule 3  . fluticasone (FLONASE) 50 MCG/ACT nasal spray Place 2 sprays into both nostrils daily. 16 g 3  . hydrochlorothiazide (HYDRODIURIL) 25 MG tablet Take 1 tablet (25 mg total) by mouth daily. 90 tablet 3  . levocetirizine (XYZAL) 5 MG tablet Take 1 tablet (5 mg total) by mouth every  evening. 30 tablet 1  . potassium chloride SA (KLOR-CON) 20 MEQ tablet Take 1 tablet (20 mEq total) by mouth daily. 30 tablet 3  . pregabalin (LYRICA) 100 MG capsule Take 1 capsule (100 mg total) by mouth 2 (two) times daily. 60 capsule 2  . moxifloxacin (AVELOX) 400 MG tablet Take 1 tablet (400 mg total) by mouth daily. (Patient not taking: Reported on 09/29/2019) 7 tablet 0   No current facility-administered medications on file prior to visit.    Allergies  Allergen Reactions  . Penicillins Hives    Has patient had a PCN reaction causing immediate rash, facial/tongue/throat swelling, SOB or lightheadedness with hypotension:no Has patient had a PCN reaction causing severe rash involving mucus membranes or skin necrosis: Yes Has patient had a PCN reaction that required hospitalization No Has patient had a PCN reaction occurring within the last 10 years: No If all of the above answers are "NO", then may proceed with Cephalosporin use.     Family History  Problem Relation Age of Onset  . Lupus Mother   . Heart disease Mother   . High blood pressure Mother   . High Cholesterol Mother   . Thyroid disease Mother   . Cancer Mother   . Depression Mother   . Anxiety disorder Mother   . Sleep apnea Mother   . Alcoholism Mother   . Obesity Mother   . Heart disease Father   . Diabetes Maternal Grandmother   .  Heart disease Maternal Grandmother   . Hypertension Maternal Grandmother   . Prostate cancer Maternal Grandfather   . Alzheimer's disease Paternal Grandfather   . ADD / ADHD Son   . Colon cancer Neg Hx   . Stomach cancer Neg Hx     Social History   Socioeconomic History  . Marital status: Legally Separated    Spouse name: Not on file  . Number of children: 3  . Years of education: 28  . Highest education level: Not on file  Occupational History  . Occupation: Scientist, clinical (histocompatibility and immunogenetics)  Tobacco Use  . Smoking status: Former Smoker    Types: Cigarettes  . Smokeless tobacco:  Never Used  . Tobacco comment: not smoking at all  Substance and Sexual Activity  . Alcohol use: Yes    Alcohol/week: 0.0 standard drinks  . Drug use: No  . Sexual activity: Yes    Birth control/protection: Surgical    Comment: intercourse age 41, sexual partners more than 5  Other Topics Concern  . Not on file  Social History Narrative   Fun: Sleep    Social Determinants of Health   Financial Resource Strain:   . Difficulty of Paying Living Expenses:   Food Insecurity:   . Worried About Charity fundraiser in the Last Year:   . Arboriculturist in the Last Year:   Transportation Needs:   . Film/video editor (Medical):   Marland Kitchen Lack of Transportation (Non-Medical):   Physical Activity:   . Days of Exercise per Week:   . Minutes of Exercise per Session:   Stress:   . Feeling of Stress :   Social Connections:   . Frequency of Communication with Friends and Family:   . Frequency of Social Gatherings with Friends and Family:   . Attends Religious Services:   . Active Member of Clubs or Organizations:   . Attends Archivist Meetings:   Marland Kitchen Marital Status:     Review of Systems - See HPI.  All other ROS are negative.  BP 136/80   Pulse 87   Temp 97.7 F (36.5 C) (Skin)   Resp 16   Ht '5\' 6"'$  (1.676 m)   Wt 271 lb 4 oz (123 kg)   LMP  (LMP Unknown)   SpO2 96%   BMI 43.78 kg/m   Physical Exam Vitals reviewed.  Constitutional:      Appearance: Normal appearance.  HENT:     Head: Normocephalic and atraumatic.     Right Ear: Tympanic membrane normal.     Left Ear: Tympanic membrane normal.     Nose: Nose normal.  Eyes:     Conjunctiva/sclera: Conjunctivae normal.     Pupils: Pupils are equal, round, and reactive to light.  Cardiovascular:     Rate and Rhythm: Normal rate and regular rhythm.     Pulses: Normal pulses.     Heart sounds: Normal heart sounds.  Pulmonary:     Effort: Pulmonary effort is normal.     Breath sounds: Normal breath sounds.    Musculoskeletal:     Cervical back: Neck supple.  Neurological:     General: No focal deficit present.     Mental Status: She is alert.  Psychiatric:        Mood and Affect: Mood normal.     Recent Results (from the past 2160 hour(s))  CBC with Differential/Platelet     Status: None   Collection Time: 08/19/19  2:12  PM  Result Value Ref Range   WBC 3.4 3.4 - 10.8 x10E3/uL   RBC 4.15 3.77 - 5.28 x10E6/uL   Hemoglobin 11.9 11.1 - 15.9 g/dL   Hematocrit 35.8 34.0 - 46.6 %   MCV 86 79 - 97 fL   MCH 28.7 26.6 - 33.0 pg   MCHC 33.2 31.5 - 35.7 g/dL   RDW 12.8 11.7 - 15.4 %   Platelets 326 150 - 450 x10E3/uL   Neutrophils 40 Not Estab. %   Lymphs 46 Not Estab. %   Monocytes 8 Not Estab. %   Eos 5 Not Estab. %   Basos 1 Not Estab. %   Neutrophils Absolute 1.4 1.4 - 7.0 x10E3/uL   Lymphocytes Absolute 1.6 0.7 - 3.1 x10E3/uL   Monocytes Absolute 0.3 0.1 - 0.9 x10E3/uL   EOS (ABSOLUTE) 0.2 0.0 - 0.4 x10E3/uL   Basophils Absolute 0.0 0.0 - 0.2 x10E3/uL   Immature Granulocytes 0 Not Estab. %   Immature Grans (Abs) 0.0 0.0 - 0.1 x10E3/uL  Comprehensive metabolic panel     Status: Abnormal   Collection Time: 08/19/19  2:12 PM  Result Value Ref Range   Glucose 105 (H) 65 - 99 mg/dL   BUN 9 6 - 24 mg/dL   Creatinine, Ser 0.79 0.57 - 1.00 mg/dL   GFR calc non Af Amer 93 >59 mL/min/1.73   GFR calc Af Amer 107 >59 mL/min/1.73   BUN/Creatinine Ratio 11 9 - 23   Sodium 139 134 - 144 mmol/L   Potassium 4.3 3.5 - 5.2 mmol/L   Chloride 104 96 - 106 mmol/L   CO2 24 20 - 29 mmol/L   Calcium 9.5 8.7 - 10.2 mg/dL   Total Protein 7.7 6.0 - 8.5 g/dL   Albumin 4.2 3.8 - 4.8 g/dL   Globulin, Total 3.5 1.5 - 4.5 g/dL   Albumin/Globulin Ratio 1.2 1.2 - 2.2   Bilirubin Total 0.2 0.0 - 1.2 mg/dL   Alkaline Phosphatase 64 39 - 117 IU/L   AST 20 0 - 40 IU/L   ALT 16 0 - 32 IU/L  Hemoglobin A1c     Status: Abnormal   Collection Time: 08/19/19  2:12 PM  Result Value Ref Range   Hgb A1c MFr Bld  6.0 (H) 4.8 - 5.6 %    Comment:          Prediabetes: 5.7 - 6.4          Diabetes: >6.4          Glycemic control for adults with diabetes: <7.0    Est. average glucose Bld gHb Est-mCnc 126 mg/dL  Insulin, random     Status: None   Collection Time: 08/19/19  2:12 PM  Result Value Ref Range   INSULIN 22.5 2.6 - 24.9 uIU/mL  Lipid Panel With LDL/HDL Ratio     Status: Abnormal   Collection Time: 08/19/19  2:12 PM  Result Value Ref Range   Cholesterol, Total 228 (H) 100 - 199 mg/dL   Triglycerides 145 0 - 149 mg/dL   HDL 49 >39 mg/dL   VLDL Cholesterol Cal 26 5 - 40 mg/dL   LDL Chol Calc (NIH) 153 (H) 0 - 99 mg/dL   LDL/HDL Ratio 3.1 0.0 - 3.2 ratio    Comment:  LDL/HDL Ratio                                             Men  Women                               1/2 Avg.Risk  1.0    1.5                                   Avg.Risk  3.6    3.2                                2X Avg.Risk  6.2    5.0                                3X Avg.Risk  8.0    6.1   T3     Status: None   Collection Time: 08/19/19  2:12 PM  Result Value Ref Range   T3, Total 91 71 - 180 ng/dL  T4, free     Status: None   Collection Time: 08/19/19  2:12 PM  Result Value Ref Range   Free T4 0.87 0.82 - 1.77 ng/dL  TSH     Status: None   Collection Time: 08/19/19  2:12 PM  Result Value Ref Range   TSH 1.790 0.450 - 4.500 uIU/mL  VITAMIN D 25 Hydroxy (Vit-D Deficiency, Fractures)     Status: Abnormal   Collection Time: 08/19/19  2:12 PM  Result Value Ref Range   Vit D, 25-Hydroxy 26.8 (L) 30.0 - 100.0 ng/mL    Comment: Vitamin D deficiency has been defined by the [place] and an Endocrine Society practice guideline as a level of serum 25-OH vitamin D less than 20 ng/mL (1,2). The Endocrine Society went on to further define vitamin D insufficiency as a level between 21 and 29 ng/mL (2). 1. IOM (Institute of Medicine). 2010. Dietary reference    intakes for calcium  and D. [place]: The    [employer]. 2. Holick MF, Binkley [place], Bischoff-Ferrari HA, et al.    Evaluation, treatment, and prevention of vitamin D    deficiency: an Endocrine Society clinical practice    guideline. JCEM. 2011 Jul; 96(7):1911-30.   Vitamin B12     Status: None   Collection Time: 08/19/19  2:12 PM  Result Value Ref Range   Vitamin B-12 457 232 - 1,245 pg/mL    Assessment/Plan: 1. Chronic fatigue Likely multifactorial including ongoing sinus issues. BP improved today. Exam unremarkable. Lab assessment today as noted below. - CBC w/Diff - Comp Met (CMET) - Sedimentation rate - Vitamin D (25 hydroxy)  2. Recurrent sinusitis Continued sinus inflammation despite 2 rounds of antibiotic. Continue allergy regimen. Needs ENT assessment. Has current provider and has been instructed to call them to schedule.  3. Essential hypertension Improved. Continue klor con daily along with HCTZ. Repeat metabolic panel today to assess.    This visit occurred during the SARS-CoV-2 public health emergency.  Safety protocols were in place, including screening questions prior to the visit, additional usage of staff PPE,  and extensive cleaning of exam room while observing appropriate contact time as indicated for disinfecting solutions.     Leeanne Rio, PA-C

## 2019-09-30 ENCOUNTER — Encounter: Payer: Self-pay | Admitting: Physician Assistant

## 2019-09-30 LAB — CBC WITH DIFFERENTIAL/PLATELET
Basophils Absolute: 0.1 10*3/uL (ref 0.0–0.1)
Basophils Relative: 1.1 % (ref 0.0–3.0)
Eosinophils Absolute: 0.2 10*3/uL (ref 0.0–0.7)
Eosinophils Relative: 3.1 % (ref 0.0–5.0)
HCT: 33.3 % — ABNORMAL LOW (ref 36.0–46.0)
Hemoglobin: 11.2 g/dL — ABNORMAL LOW (ref 12.0–15.0)
Lymphocytes Relative: 36.9 % (ref 12.0–46.0)
Lymphs Abs: 1.9 10*3/uL (ref 0.7–4.0)
MCHC: 33.5 g/dL (ref 30.0–36.0)
MCV: 87.4 fl (ref 78.0–100.0)
Monocytes Absolute: 0.5 10*3/uL (ref 0.1–1.0)
Monocytes Relative: 9.8 % (ref 3.0–12.0)
Neutro Abs: 2.5 10*3/uL (ref 1.4–7.7)
Neutrophils Relative %: 49.1 % (ref 43.0–77.0)
Platelets: 311 10*3/uL (ref 150.0–400.0)
RBC: 3.81 Mil/uL — ABNORMAL LOW (ref 3.87–5.11)
RDW: 13.5 % (ref 11.5–15.5)
WBC: 5.2 10*3/uL (ref 4.0–10.5)

## 2019-09-30 LAB — SEDIMENTATION RATE: Sed Rate: 41 mm/hr — ABNORMAL HIGH (ref 0–20)

## 2019-09-30 LAB — VITAMIN D 25 HYDROXY (VIT D DEFICIENCY, FRACTURES): VITD: 18.81 ng/mL — ABNORMAL LOW (ref 30.00–100.00)

## 2019-09-30 LAB — COMPREHENSIVE METABOLIC PANEL
ALT: 12 U/L (ref 0–35)
AST: 14 U/L (ref 0–37)
Albumin: 4.1 g/dL (ref 3.5–5.2)
Alkaline Phosphatase: 54 U/L (ref 39–117)
BUN: 12 mg/dL (ref 6–23)
CO2: 28 mEq/L (ref 19–32)
Calcium: 8.9 mg/dL (ref 8.4–10.5)
Chloride: 104 mEq/L (ref 96–112)
Creatinine, Ser: 1.14 mg/dL (ref 0.40–1.20)
GFR: 63.14 mL/min (ref >60.00)
Glucose, Bld: 123 mg/dL — ABNORMAL HIGH (ref 70–99)
Potassium: 4 mEq/L (ref 3.5–5.1)
Sodium: 136 mEq/L (ref 135–145)
Total Bilirubin: 0.2 mg/dL (ref 0.2–1.2)
Total Protein: 7.1 g/dL (ref 6.0–8.3)

## 2019-10-01 ENCOUNTER — Telehealth: Payer: Self-pay | Admitting: Physician Assistant

## 2019-10-01 ENCOUNTER — Other Ambulatory Visit: Payer: Self-pay | Admitting: Physician Assistant

## 2019-10-01 DIAGNOSIS — E559 Vitamin D deficiency, unspecified: Secondary | ICD-10-CM

## 2019-10-01 MED ORDER — VITAMIN D (ERGOCALCIFEROL) 1.25 MG (50000 UNIT) PO CAPS: 50000.0000 [IU] | ORAL_CAPSULE | ORAL | 2 refills | Status: DC

## 2019-10-01 NOTE — Telephone Encounter (Addendum)
Spoke with representative and they are requesting recent office visit notes to be faxed to be added to her claim. Fax # [phone] Once the note is completed to fax notes to number above

## 2019-10-01 NOTE — Telephone Encounter (Signed)
Calling about disability form - wants to clarify what they need

## 2019-10-06 DIAGNOSIS — I10 Essential (primary) hypertension: Secondary | ICD-10-CM | POA: Insufficient documentation

## 2019-10-09 ENCOUNTER — Encounter: Payer: Self-pay | Admitting: Physician Assistant

## 2019-10-22 ENCOUNTER — Encounter: Payer: Self-pay | Admitting: Physician Assistant

## 2019-10-22 DIAGNOSIS — N939 Abnormal uterine and vaginal bleeding, unspecified: Secondary | ICD-10-CM

## 2019-10-27 ENCOUNTER — Other Ambulatory Visit: Payer: Self-pay

## 2019-10-29 ENCOUNTER — Ambulatory Visit: Payer: 59 | Admitting: Nurse Practitioner

## 2019-10-29 DIAGNOSIS — Z0289 Encounter for other administrative examinations: Secondary | ICD-10-CM

## 2019-11-03 ENCOUNTER — Encounter: Payer: Self-pay | Admitting: Physician Assistant

## 2019-11-03 ENCOUNTER — Other Ambulatory Visit: Payer: Self-pay

## 2019-11-03 ENCOUNTER — Telehealth (INDEPENDENT_AMBULATORY_CARE_PROVIDER_SITE_OTHER): Payer: 59 | Admitting: Physician Assistant

## 2019-11-03 DIAGNOSIS — T7840XA Allergy, unspecified, initial encounter: Secondary | ICD-10-CM

## 2019-11-03 MED ORDER — PREDNISONE 10 MG PO TABS: ORAL_TABLET | ORAL | 0 refills | Status: AC

## 2019-11-03 NOTE — Progress Notes (Signed)
I have discussed the procedure for the virtual visit with the patient who has given consent to proceed with assessment and treatment.    S , CMA     

## 2019-11-03 NOTE — Progress Notes (Signed)
Virtual Visit via Video   I connected with patient on 11/03/19 at  3:30 PM EDT by a video enabled telemedicine application and verified that I am speaking with the correct person using two identifiers.  Location patient: Home Location provider: [facility], Office Persons participating in the virtual visit: Patient, Provider, Power (Patina Moore)  I discussed the limitations of evaluation and management by telemedicine and the availability of in person appointments. The patient expressed understanding and agreed to proceed.  Subjective:   HPI:   Patient presents via [facility] today c/o facial puffiness, nasal congestion, rhinorrhea, water/itchy eyes and rash of face over the past few days. Symptoms started after working in her garden. Denies SOB or tongue swelling. Denies fever, racing heart or lightheadedness.  Denies change to soaps, lotions or hygiene products.  Has been taking the Xyzal each morning and Benadryl in the evening which has helped but have not resolved symptoms.  Denies recent travel or sick contact.  ROS:   See pertinent positives and negatives per HPI.  Patient Active Problem List   Diagnosis Date Noted   Essential hypertension 10/06/2019   Elevated cholesterol 08/20/2019   COVID-19 virus infection 04/03/2019   Obstructive sleep apnea of adult 05/15/2018   Obesity (BMI 35.0-39.9 without comorbidity) 05/15/2018   Hypothyroidism 05/15/2018   Fibromyalgia 09/27/2017   Primary osteoarthritis of both knees 09/27/2017   Vitamin D deficiency 09/27/2017   Leiomyoma 10/30/2016   Prediabetes 08/21/2016   Gastroesophageal reflux disease 08/21/2016   Thornwaldt's cyst 05/29/2016   Morbid obesity ([place]) 11/09/2015   Decreased attention Span 09/30/2015   Iron deficiency anemia 10/19/2014    Social History   Tobacco Use   Smoking status: Former Smoker    Types: Cigarettes   Smokeless tobacco: Never Used   Tobacco comment: not smoking at  all  Substance Use Topics   Alcohol use: Yes    Alcohol/week: 0.0 standard drinks    Current Outpatient Medications:    butalbital-acetaminophen-caffeine (FIORICET) 50-325-40 MG tablet, Take 1-2 tablets by mouth every 6 (six) hours as needed for headache., Disp: 20 tablet, Rfl: 0   clonazePAM (KLONOPIN) 0.5 MG tablet, Take 1 tablet (0.5 mg total) by mouth 2 (two) times daily as needed. for anxiety, Disp: 60 tablet, Rfl: 0   DULoxetine (CYMBALTA) 60 MG capsule, Take 1 capsule (60 mg total) by mouth daily., Disp: 30 capsule, Rfl: 3   famotidine (PEPCID) 40 MG tablet, Take 1 tablet (40 mg total) by mouth at bedtime., Disp: 30 tablet, Rfl: 1   fluticasone (FLONASE) 50 MCG/ACT nasal spray, Place 2 sprays into both nostrils daily., Disp: 16 g, Rfl: 3   hydrochlorothiazide (HYDRODIURIL) 25 MG tablet, Take 1 tablet (25 mg total) by mouth daily., Disp: 90 tablet, Rfl: 3   levocetirizine (XYZAL) 5 MG tablet, Take 1 tablet (5 mg total) by mouth every evening., Disp: 30 tablet, Rfl: 1   potassium chloride SA (KLOR-CON) 20 MEQ tablet, Take 1 tablet (20 mEq total) by mouth daily., Disp: 30 tablet, Rfl: 3   pregabalin (LYRICA) 100 MG capsule, Take 1 capsule (100 mg total) by mouth 2 (two) times daily., Disp: 60 capsule, Rfl: 2   Vitamin D, Ergocalciferol, (DRISDOL) 1.25 MG (50000 UNIT) CAPS capsule, Take 1 capsule (50,000 Units total) by mouth every 7 (seven) days., Disp: 4 capsule, Rfl: 2  Allergies  Allergen Reactions   Penicillins Hives    Has patient had a PCN reaction causing immediate rash, facial/tongue/throat swelling, SOB or lightheadedness with hypotension:no  Has patient had a PCN reaction causing severe rash involving mucus membranes or skin necrosis: Yes Has patient had a PCN reaction that required hospitalization No Has patient had a PCN reaction occurring within the last 10 years: No If all of the above answers are "NO", then may proceed with Cephalosporin use.     Objective:     LMP  (LMP Unknown)   Patient is well-developed, well-nourished in no acute distress.  Resting comfortably at home.  Head is normocephalic, atraumatic.  No labored breathing.  Speech is clear and coherent with logical content.  Patient is alert and oriented at baseline.  Facial puffiness noted of cheeks and upper and lower eyelids on examination. Rough texturing of skin noted of cheeks bilaterally without erythema.  Assessment and Plan:   1. Allergic reaction, initial encounter Moderate. No tongue swelling. No SOB. Occasional wheeze but this has been ongoing with allergic asthma. Will refer to Allergy for further management. For current issue will have her start cold compresses to face. Continue Xzyal in AM and Benadryl in the PM. Start prednisone taper. Supportive measures and strict ER precautions reviewed.    Leeanne Rio, PA-C 11/03/2019

## 2020-01-01 ENCOUNTER — Ambulatory Visit: Payer: 59 | Admitting: Allergy

## 2020-01-22 ENCOUNTER — Encounter (HOSPITAL_COMMUNITY): Payer: Self-pay | Admitting: Emergency Medicine

## 2020-01-22 ENCOUNTER — Other Ambulatory Visit: Payer: Self-pay

## 2020-01-22 ENCOUNTER — Emergency Department (HOSPITAL_COMMUNITY)
Admission: EM | Admit: 2020-01-22 | Discharge: 2020-01-22 | Disposition: A | Discharge status: Home / Self Care | Payer: 59 | Attending: Emergency Medicine | Admitting: Emergency Medicine

## 2020-01-22 ENCOUNTER — Emergency Department (HOSPITAL_COMMUNITY): Payer: 59

## 2020-01-22 DIAGNOSIS — Z20822 Contact with and (suspected) exposure to covid-19: Secondary | ICD-10-CM | POA: Insufficient documentation

## 2020-01-22 DIAGNOSIS — Z79899 Other long term (current) drug therapy: Secondary | ICD-10-CM | POA: Insufficient documentation

## 2020-01-22 DIAGNOSIS — I10 Essential (primary) hypertension: Secondary | ICD-10-CM | POA: Insufficient documentation

## 2020-01-22 DIAGNOSIS — R109 Unspecified abdominal pain: Secondary | ICD-10-CM | POA: Insufficient documentation

## 2020-01-22 DIAGNOSIS — E119 Type 2 diabetes mellitus without complications: Secondary | ICD-10-CM | POA: Insufficient documentation

## 2020-01-22 DIAGNOSIS — K219 Gastro-esophageal reflux disease without esophagitis: Secondary | ICD-10-CM | POA: Diagnosis not present

## 2020-01-22 DIAGNOSIS — J069 Acute upper respiratory infection, unspecified: Secondary | ICD-10-CM

## 2020-01-22 DIAGNOSIS — R197 Diarrhea, unspecified: Secondary | ICD-10-CM | POA: Insufficient documentation

## 2020-01-22 DIAGNOSIS — Z87891 Personal history of nicotine dependence: Secondary | ICD-10-CM | POA: Diagnosis not present

## 2020-01-22 DIAGNOSIS — R05 Cough: Secondary | ICD-10-CM | POA: Diagnosis present

## 2020-01-22 DIAGNOSIS — E039 Hypothyroidism, unspecified: Secondary | ICD-10-CM | POA: Insufficient documentation

## 2020-01-22 DIAGNOSIS — J45909 Unspecified asthma, uncomplicated: Secondary | ICD-10-CM | POA: Insufficient documentation

## 2020-01-22 DIAGNOSIS — Z7951 Long term (current) use of inhaled steroids: Secondary | ICD-10-CM | POA: Diagnosis not present

## 2020-01-22 LAB — RESP PANEL BY RT PCR (RSV, FLU A&B, COVID)
Influenza A by PCR: NEGATIVE
Influenza B by PCR: NEGATIVE
Respiratory Syncytial Virus by PCR: NEGATIVE
SARS Coronavirus 2 by RT PCR: NEGATIVE

## 2020-01-22 NOTE — Discharge Instructions (Addendum)
Seen for viral infection.  I recommend taking over-the-counter medications like Tylenol for fever control,  ibuprofen for pain control and Claritin for decongestion.  Please follow dosing the back of bottle.  Please stay hydrated, if you do not have an appetite recommend consuming soup as this will give you fluid as well as calories.  Your Covid test is pending please self quarantine until your Covid test has resulted on my chart.  If you are Covid positive you must remain self quarantine for 10 days on symptom onset.  Please contact post Covid care for further instruction for your Covid symptoms.  If you are Covid negative and symptoms continue over 7 days I recommend follow-up with your primary care provider for further evaluation management.  Please come back to the emergency department if you develop chest pain, shortness of breath, severe abdominal pain, nausea, vomiting, diarrhea.

## 2020-01-22 NOTE — ED Triage Notes (Addendum)
Patient c/o cough, headache and SOB since yesterday. Reports positive exposure. States fully vaccinated. Patient oxygen saturation 98% after ambulating to treatment room from lobby.

## 2020-01-22 NOTE — ED Provider Notes (Signed)
[facility] DEPT Provider Note   CSN: [id number] Arrival date & time: 01/22/20  1840     History Chief Complaint  Patient presents with  . Cough  . Headache  . Shortness of Breath    Tracy Valenzuela is a 44 y.o. female.  HPI   Patient with significant medical history of depression, bipolar, diabetes, asthma presents to the emergency department with chief complaint of headaches, sore throat, cough, congestion, abdominal pain and one episodes of diarrhea.  Patient states symptoms started on Tuesday and has progressively gotten worse.  Patient states she is Covid vaccinated but states she works in [employer].  She was seen at her primary care office who recommend that she comes emergency department to be further evaluated.  Patient denies chest pain, shortness of breath, tolerating p.o., denies seeing blood in her urine or stool, no pedal edema.  Patient has been taking Tylenol with some relief.  Patient denies chest pain, shortness of breath, nausea, vomiting, worsening pedal edema.  Past Medical History:  Diagnosis Date  . Alcohol abuse   . Anemia   . Anxiety   . ASCUS with positive high risk HPV 11/2014  . Asthma   . Bipolar 1 disorder ([place])   . Constipation   . Depression   . Diabetes mellitus without complication ([place])   . Fibromyalgia   . GERD (gastroesophageal reflux disease)   . Heart murmur   . LGSIL (low grade squamous intraepithelial dysplasia) 11/2014   colposcopy biopsy.  recommend follow up pap in one year  . Obesity   . Pre-diabetes   . Prediabetes   . Vitamin D deficiency     Patient Active Problem List   Diagnosis Date Noted  . Essential hypertension 10/06/2019  . Elevated cholesterol 08/20/2019  . COVID-19 virus infection 04/03/2019  . Obstructive sleep apnea of adult 05/15/2018  . Obesity (BMI 35.0-39.9 without comorbidity) 05/15/2018  . Hypothyroidism 05/15/2018  . Fibromyalgia 09/27/2017  . Primary osteoarthritis of  both knees 09/27/2017  . Vitamin D deficiency 09/27/2017  . Leiomyoma 10/30/2016  . Prediabetes 08/21/2016  . Gastroesophageal reflux disease 08/21/2016  . Thornwaldt's cyst 05/29/2016  . Morbid obesity ([place]) 11/09/2015  . Decreased attention Span 09/30/2015  . Iron deficiency anemia 10/19/2014    Past Surgical History:  Procedure Laterality Date  . CARDIAC CATHETERIZATION    . CYSTOSCOPY  10/30/2016   Procedure: CYSTOSCOPY;  Surgeon: Anastasio Auerbach, MD;  Location: [facility];  Service: Gynecology;;  . LAPAROSCOPIC VAGINAL HYSTERECTOMY WITH SALPINGECTOMY Bilateral 10/30/2016   Procedure: LAPAROSCOPIC ASSISTED VAGINAL HYSTERECTOMY WITH SALPINGECTOMY;  Surgeon: Anastasio Auerbach, MD;  Location: [facility];  Service: Gynecology;  Laterality: Bilateral;  . TUBAL LIGATION       OB History    Gravida  3   Para  1   Term      Preterm  1   AB  1   Living  3     SAB  1   TAB      Ectopic  0   Multiple      Live Births           Obstetric Comments  Had a set of twins that were preterm (30 weeks)        Family History  Problem Relation Age of Onset  . Lupus Mother   . Heart disease Mother   . High blood pressure Mother   . High Cholesterol Mother   . Thyroid disease Mother   .  Cancer Mother   . Depression Mother   . Anxiety disorder Mother   . Sleep apnea Mother   . Alcoholism Mother   . Obesity Mother   . Heart disease Father   . Diabetes Maternal Grandmother   . Heart disease Maternal Grandmother   . Hypertension Maternal Grandmother   . Prostate cancer Maternal Grandfather   . Alzheimer's disease Paternal Grandfather   . ADD / ADHD Son   . Colon cancer Neg Hx   . Stomach cancer Neg Hx     Social History   Tobacco Use  . Smoking status: Former Smoker    Types: Cigarettes  . Smokeless tobacco: Never Used  . Tobacco comment: not smoking at all  Vaping Use  . Vaping Use: Never used  Substance Use Topics  . Alcohol use: Yes    Alcohol/week: 0.0  standard drinks  . Drug use: No    Home Medications Prior to Admission medications   Medication Sig Start Date End Date Taking? Authorizing Provider  butalbital-acetaminophen-caffeine (FIORICET) 50-325-40 MG tablet Take 1-2 tablets by mouth every 6 (six) hours as needed for headache. 09/16/19 09/15/20  Kinnie Feil, PA-C  clonazePAM (KLONOPIN) 0.5 MG tablet Take 1 tablet (0.5 mg total) by mouth 2 (two) times daily as needed. for anxiety 09/17/19   Brunetta Jeans, PA-C  DULoxetine (CYMBALTA) 60 MG capsule Take 1 capsule (60 mg total) by mouth daily. 07/09/19   Brunetta Jeans, PA-C  famotidine (PEPCID) 40 MG tablet Take 1 tablet (40 mg total) by mouth at bedtime. 09/29/19   Brunetta Jeans, PA-C  fluticasone (FLONASE) 50 MCG/ACT nasal spray Place 2 sprays into both nostrils daily. 08/05/19   Brunetta Jeans, PA-C  hydrochlorothiazide (HYDRODIURIL) 25 MG tablet Take 1 tablet (25 mg total) by mouth daily. 09/21/19   Brunetta Jeans, PA-C  levocetirizine (XYZAL) 5 MG tablet Take 1 tablet (5 mg total) by mouth every evening. 09/21/19   Brunetta Jeans, PA-C  potassium chloride SA (KLOR-CON) 20 MEQ tablet Take 1 tablet (20 mEq total) by mouth daily. 09/21/19   Brunetta Jeans, PA-C  pregabalin (LYRICA) 100 MG capsule Take 1 capsule (100 mg total) by mouth 2 (two) times daily. 09/11/19   Brunetta Jeans, PA-C  Vitamin D, Ergocalciferol, (DRISDOL) 1.25 MG (50000 UNIT) CAPS capsule Take 1 capsule (50,000 Units total) by mouth every 7 (seven) days. 10/01/19   Brunetta Jeans, PA-C    Allergies    Penicillins  Review of Systems   Review of Systems  Constitutional: Positive for chills and fever.  HENT: Positive for sore throat. Negative for congestion, trouble swallowing and voice change.   Eyes: Negative for visual disturbance.  Respiratory: Positive for cough. Negative for shortness of breath.   Cardiovascular: Negative for chest pain and palpitations.  Gastrointestinal: Positive for  abdominal pain and diarrhea. Negative for nausea and vomiting.  Genitourinary: Negative for dysuria, enuresis and flank pain.  Musculoskeletal: Negative for back pain.  Skin: Negative for rash.  Neurological: Negative for dizziness, light-headedness and numbness.  Hematological: Does not bruise/bleed easily.    Physical Exam Updated Vital Signs BP (!) 141/81   Pulse 74   Temp 97.8 F (36.6 C) (Oral)   Resp 20   LMP  (LMP Unknown)   SpO2 95%   Physical Exam Vitals and nursing note reviewed.  Constitutional:      General: She is not in acute distress.    Appearance: She is not ill-appearing.  HENT:     Head: Normocephalic and atraumatic.     Right Ear: Tympanic membrane, ear canal and external ear normal.     Left Ear: Tympanic membrane, ear canal and external ear normal.     Nose: No congestion.     Mouth/Throat:     Mouth: Mucous membranes are moist.     Pharynx: Oropharynx is clear. No oropharyngeal exudate or posterior oropharyngeal erythema.  Eyes:     General: No scleral icterus. Cardiovascular:     Rate and Rhythm: Normal rate and regular rhythm.     Pulses: Normal pulses.     Heart sounds: No murmur heard.  No friction rub. No gallop.   Pulmonary:     Effort: No respiratory distress.     Breath sounds: No wheezing, rhonchi or rales.     Comments: Patient did not show any signs of pressure distress, lung sounds were evaluated, bilateral rhonchi heard, no rales, wheezing, stridor. Abdominal:     General: There is no distension.     Palpations: Abdomen is soft.     Tenderness: There is no abdominal tenderness. There is no right CVA tenderness, left CVA tenderness or guarding.  Musculoskeletal:        General: No swelling or tenderness.     Right lower leg: No edema.     Left lower leg: No edema.  Skin:    General: Skin is warm and dry.     Findings: No rash.  Neurological:     General: No focal deficit present.     Mental Status: She is alert.  Psychiatric:         Mood and Affect: Mood normal.     ED Results / Procedures / Treatments   Labs (all labs ordered are listed, but only abnormal results are displayed) Labs Reviewed  RESP PANEL BY RT PCR (RSV, FLU A&B, COVID)    EKG None  Radiology DG Chest Port 1 View  Result Date: 01/22/2020 CLINICAL DATA:  Cough, headache, short of breath EXAM: PORTABLE CHEST 1 VIEW COMPARISON:  04/27/2017 FINDINGS: Single frontal view of the chest demonstrates an unremarkable cardiac silhouette. No airspace disease, effusion, or pneumothorax. No acute bony abnormalities. IMPRESSION: 1. No acute intrathoracic process. Electronically Signed   By: Randa Ngo M.D.   On: 01/22/2020 20:17    Procedures Procedures (including critical care time)  Medications Ordered in ED Medications - No data to display  ED Course  I have reviewed the triage vital signs and the nursing notes.  Pertinent labs & imaging results that were available during my care of the patient were reviewed by me and considered in my medical decision making (see chart for details).    MDM Rules/Calculators/A&P                          Patient presents with nasal congestion, sore throat, cough, abdominal pain, one episode of diarrhea since Tuesday.  She was alert, vital signs reassuring, did not appear to be in acute distress.  Will order chest x-ray and respiratory panel for further evaluation.  Chest x-ray does not show any acute abnormalities, respiratory panel was negative for Covid, influenza A and B, RSV.  I have low suspicion for pneumonia and or systemic infection as patient is nontoxic appearing, vital signs reassuring, no obvious source of infection noted on exam, chest x-ray unremarkable.  Low suspicion for PE as patient denies pleuritic chest pain, shortness of breath,  leg pain, leg swelling, patient has low risk factors, history is more consistent with a viral URI.  Low suspicion for strep throat as oropharynx is visualized no  erythema exudates noted.  low suspicion for intra-abdominal abnormality requiring surgical intervention as patient tolerating p.o., no acute abdomen noted on exam.  I suspect patient suffering from viral URI, will recommend over-the-counter pain medication, hydration and following up with her PCP for further evaluation.  Vital signs remained stable, no indication for hospital admission.  Patient was given at home care as well as strict return precautions.  Patient verbalized that she understood and agreed to plan. Final Clinical Impression(s) / ED Diagnoses Final diagnoses:  Viral URI with cough    Rx / DC Orders ED Discharge Orders    None       Marcello Fennel, PA-C 01/22/20 2203    Drenda Freeze, MD 01/22/20 [phone]

## 2020-02-09 ENCOUNTER — Encounter: Payer: Self-pay | Admitting: Family Medicine

## 2020-02-09 ENCOUNTER — Telehealth (INDEPENDENT_AMBULATORY_CARE_PROVIDER_SITE_OTHER): Payer: 59 | Admitting: Family Medicine

## 2020-02-09 ENCOUNTER — Telehealth: Payer: 59 | Admitting: Physician Assistant

## 2020-02-09 ENCOUNTER — Other Ambulatory Visit: Payer: Self-pay

## 2020-02-09 DIAGNOSIS — J019 Acute sinusitis, unspecified: Secondary | ICD-10-CM | POA: Diagnosis not present

## 2020-02-09 DIAGNOSIS — B9689 Other specified bacterial agents as the cause of diseases classified elsewhere: Secondary | ICD-10-CM

## 2020-02-09 MED ORDER — DOXYCYCLINE HYCLATE 100 MG PO TABS
100.0000 mg | ORAL_TABLET | Freq: Two times a day (BID) | ORAL | 0 refills | Status: DC
Start: 2020-02-09 — End: 2020-03-01

## 2020-02-09 NOTE — Progress Notes (Signed)
Virtual Visit via Video   I connected with patient on 02/09/20 at  1:30 PM EDT by a video enabled telemedicine application and verified that I am speaking with the correct person using two identifiers.  Location patient: Home Location provider: [facility] Persons participating in the virtual visit: Patient, Provider, [place] (Sabrina M)  I discussed the limitations of evaluation and management by telemedicine and the availability of in person appointments. The patient expressed understanding and agreed to proceed.  Subjective:   HPI:   URI- pt went to ER on 9/24 and was told 'upper respiratory'.  CXR was clear, viral panel negative.  Pt is having both frontal and maxillary sinus pain.  + tooth pain.  + HA.  + cough and chest congestion- 'but it won't come up'.  Taking Mucinex w/o relief.  + chills and subjective fever overnight.  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  Pt has frequent sinus infxns and sees ENT.    ROS:   See pertinent positives and negatives per HPI.  Patient Active Problem List   Diagnosis Date Noted  . Essential hypertension 10/06/2019  . Elevated cholesterol 08/20/2019  . COVID-19 virus infection 04/03/2019  . Obstructive sleep apnea of adult 05/15/2018  . Obesity (BMI 35.0-39.9 without comorbidity) 05/15/2018  . Hypothyroidism 05/15/2018  . Fibromyalgia 09/27/2017  . Primary osteoarthritis of both knees 09/27/2017  . Vitamin D deficiency 09/27/2017  . Leiomyoma 10/30/2016  . Prediabetes 08/21/2016  . Gastroesophageal reflux disease 08/21/2016  . Thornwaldt's cyst 05/29/2016  . Morbid obesity ([place]) 11/09/2015  . Decreased attention Span 09/30/2015  . Iron deficiency anemia 10/19/2014    Social History   Tobacco Use  . Smoking status: Former Smoker    Types: Cigarettes  . Smokeless tobacco: Never Used  . Tobacco comment: not smoking at all  Substance Use Topics  . Alcohol use: Yes    Alcohol/week: 0.0 standard drinks      Current Outpatient Medications:  .  butalbital-acetaminophen-caffeine (FIORICET) 50-325-40 MG tablet, Take 1-2 tablets by mouth every 6 (six) hours as needed for headache., Disp: 20 tablet, Rfl: 0 .  clonazePAM (KLONOPIN) 0.5 MG tablet, Take 1 tablet (0.5 mg total) by mouth 2 (two) times daily as needed. for anxiety, Disp: 60 tablet, Rfl: 0 .  DULoxetine (CYMBALTA) 60 MG capsule, Take 1 capsule (60 mg total) by mouth daily., Disp: 30 capsule, Rfl: 3 .  famotidine (PEPCID) 40 MG tablet, Take 1 tablet (40 mg total) by mouth at bedtime., Disp: 30 tablet, Rfl: 1 .  fluticasone (FLONASE) 50 MCG/ACT nasal spray, Place 2 sprays into both nostrils daily., Disp: 16 g, Rfl: 3 .  hydrochlorothiazide (HYDRODIURIL) 25 MG tablet, Take 1 tablet (25 mg total) by mouth daily., Disp: 90 tablet, Rfl: 3 .  levocetirizine (XYZAL) 5 MG tablet, Take 1 tablet (5 mg total) by mouth every evening., Disp: 30 tablet, Rfl: 1 .  metFORMIN (GLUCOPHAGE) 500 MG tablet, Take 500 mg by mouth daily., Disp: , Rfl:  .  NP THYROID 30 MG tablet, Take 30 mg by mouth daily., Disp: , Rfl:  .  pregabalin (LYRICA) 100 MG capsule, Take 1 capsule (100 mg total) by mouth 2 (two) times daily., Disp: 60 capsule, Rfl: 2 .  potassium chloride SA (KLOR-CON) 20 MEQ tablet, Take 1 tablet (20 mEq total) by mouth daily. (Patient not taking: Reported on 02/09/2020), Disp: 30 tablet, Rfl: 3 .  Vitamin D, Ergocalciferol, (DRISDOL) 1.25 MG (50000 UNIT) CAPS capsule,  Take 1 capsule (50,000 Units total) by mouth every 7 (seven) days. (Patient not taking: Reported on 02/09/2020), Disp: 4 capsule, Rfl: 2  Allergies  Allergen Reactions  . Penicillins Hives    Has patient had a PCN reaction causing immediate rash, facial/tongue/throat swelling, SOB or lightheadedness with hypotension:no Has patient had a PCN reaction causing severe rash involving mucus membranes or skin necrosis: Yes Has patient had a PCN reaction that required hospitalization No Has  patient had a PCN reaction occurring within the last 10 years: No If all of the above answers are "NO", then may proceed with Cephalosporin use.     Objective:   LMP  (LMP Unknown)  AAOx3, NAD NCAT, EOMI No obvious CN deficits Coloring WNL Pt is able to speak clearly, coherently without shortness of breath or increased work of breathing.  Thought process is linear.  Mood is appropriate.   Assessment and Plan:   Bacterial sinusitis- new to provider.  Recurrent for pt.  sxs are consistent w/ infxn.  Start abx.  Reviewed supportive care and red flags that should prompt return.  Pt expressed understanding and is in agreement w/ plan.   Annye Asa, MD 02/09/2020

## 2020-02-10 ENCOUNTER — Telehealth: Payer: 59 | Admitting: Family Medicine

## 2020-02-10 ENCOUNTER — Encounter: Payer: Self-pay | Admitting: Physician Assistant

## 2020-02-10 MED ORDER — PREDNISONE 10 MG PO TABS: ORAL_TABLET | ORAL | 0 refills | Status: DC

## 2020-02-12 ENCOUNTER — Encounter: Payer: Self-pay | Admitting: Family Medicine

## 2020-03-01 ENCOUNTER — Other Ambulatory Visit: Payer: Self-pay

## 2020-03-01 ENCOUNTER — Telehealth (INDEPENDENT_AMBULATORY_CARE_PROVIDER_SITE_OTHER): Payer: 59 | Admitting: Physician Assistant

## 2020-03-01 DIAGNOSIS — J329 Chronic sinusitis, unspecified: Secondary | ICD-10-CM | POA: Diagnosis not present

## 2020-03-01 DIAGNOSIS — R21 Rash and other nonspecific skin eruption: Secondary | ICD-10-CM

## 2020-03-01 MED ORDER — SULFAMETHOXAZOLE-TRIMETHOPRIM 800-160 MG PO TABS: 1.0000 | ORAL_TABLET | Freq: Two times a day (BID) | ORAL | 0 refills | Status: DC

## 2020-03-01 NOTE — Progress Notes (Signed)
Virtual Visit via Video   I connected with patient on 03/01/20 at 11:30 AM EDT by a video enabled telemedicine application and verified that I am speaking with the correct person using two identifiers.  Location patient: Home Location provider: [facility] Persons participating in the virtual visit: Patient, Provider, [place] (Patina Moore)  I discussed the limitations of evaluation and management by telemedicine and the availability of in person appointments. The patient expressed understanding and agreed to proceed.  Subjective:   HPI:   Patient presents via [facility] today c/o ongoing sinus symptoms. Was seen by Dr. Birdie Riddle in our office a couple of weeks ago at which time she was diagnosed with bacterial sinusitis and treated Doxycycline. Notes tolerating very well with noted improvement. Notes symptoms mostly resolved but not completely. After completion of antibiotic notes mild residual symptoms with sinus pain, etc recurring over the past 1.5 weeks.   Also noting a rash of her cheeks and face, described by her as a butterfly rash. Denies fever, chills. Has history of elevated sed rate with prior negative Rheumatology assessment (remote).  ROS:   See pertinent positives and negatives per HPI.  Patient Active Problem List   Diagnosis Date Noted  . Essential hypertension 10/06/2019  . Elevated cholesterol 08/20/2019  . COVID-19 virus infection 04/03/2019  . Obstructive sleep apnea of adult 05/15/2018  . Obesity (BMI 35.0-39.9 without comorbidity) 05/15/2018  . Hypothyroidism 05/15/2018  . Fibromyalgia 09/27/2017  . Primary osteoarthritis of both knees 09/27/2017  . Vitamin D deficiency 09/27/2017  . Leiomyoma 10/30/2016  . Prediabetes 08/21/2016  . Gastroesophageal reflux disease 08/21/2016  . Thornwaldt's cyst 05/29/2016  . Morbid obesity ([place]) 11/09/2015  . Decreased attention Span 09/30/2015  . Iron deficiency anemia 10/19/2014    Social History    Tobacco Use  . Smoking status: Former Smoker    Types: Cigarettes  . Smokeless tobacco: Never Used  . Tobacco comment: not smoking at all  Substance Use Topics  . Alcohol use: Yes    Alcohol/week: 0.0 standard drinks    Current Outpatient Medications:  .  butalbital-acetaminophen-caffeine (FIORICET) 50-325-40 MG tablet, Take 1-2 tablets by mouth every 6 (six) hours as needed for headache., Disp: 20 tablet, Rfl: 0 .  clonazePAM (KLONOPIN) 0.5 MG tablet, Take 1 tablet (0.5 mg total) by mouth 2 (two) times daily as needed. for anxiety, Disp: 60 tablet, Rfl: 0 .  doxycycline (VIBRA-TABS) 100 MG tablet, Take 1 tablet (100 mg total) by mouth 2 (two) times daily., Disp: 20 tablet, Rfl: 0 .  DULoxetine (CYMBALTA) 60 MG capsule, Take 1 capsule (60 mg total) by mouth daily., Disp: 30 capsule, Rfl: 3 .  famotidine (PEPCID) 40 MG tablet, Take 1 tablet (40 mg total) by mouth at bedtime., Disp: 30 tablet, Rfl: 1 .  fluticasone (FLONASE) 50 MCG/ACT nasal spray, Place 2 sprays into both nostrils daily., Disp: 16 g, Rfl: 3 .  hydrochlorothiazide (HYDRODIURIL) 25 MG tablet, Take 1 tablet (25 mg total) by mouth daily., Disp: 90 tablet, Rfl: 3 .  levocetirizine (XYZAL) 5 MG tablet, Take 1 tablet (5 mg total) by mouth every evening., Disp: 30 tablet, Rfl: 1 .  metFORMIN (GLUCOPHAGE) 500 MG tablet, Take 500 mg by mouth daily., Disp: , Rfl:  .  NP THYROID 30 MG tablet, Take 30 mg by mouth daily., Disp: , Rfl:  .  potassium chloride SA (KLOR-CON) 20 MEQ tablet, Take 1 tablet (20 mEq total) by mouth daily. (Patient not taking: Reported on 02/09/2020), Disp:  30 tablet, Rfl: 3 .  predniSONE (DELTASONE) 10 MG tablet, 3 tabs x3 days and then 2 tabs x3 days and then 1 tab x3 days.  Take w/ food., Disp: 18 tablet, Rfl: 0 .  pregabalin (LYRICA) 100 MG capsule, Take 1 capsule (100 mg total) by mouth 2 (two) times daily., Disp: 60 capsule, Rfl: 2 .  Vitamin D, Ergocalciferol, (DRISDOL) 1.25 MG (50000 UNIT) CAPS capsule,  Take 1 capsule (50,000 Units total) by mouth every 7 (seven) days. (Patient not taking: Reported on 02/09/2020), Disp: 4 capsule, Rfl: 2  Allergies  Allergen Reactions  . Penicillins Hives    Has patient had a PCN reaction causing immediate rash, facial/tongue/throat swelling, SOB or lightheadedness with hypotension:no Has patient had a PCN reaction causing severe rash involving mucus membranes or skin necrosis: Yes Has patient had a PCN reaction that required hospitalization No Has patient had a PCN reaction occurring within the last 10 years: No If all of the above answers are "NO", then may proceed with Cephalosporin use.     Objective:   LMP  (LMP Unknown)   Patient is well-developed, well-nourished in no acute distress.  Resting comfortably at home.  Head is normocephalic, atraumatic.  No labored breathing.  Speech is clear and coherent with logical content.  Patient is alert and oriented at baseline.   Assessment and Plan:   1. Malar rash Will obtain lab workup to further assess. Concern for lupus giving recurring rash and ongoing fatigue. May need referral to new Rheumatologist for second opinion.  - CBC with Differential/Platelet; Future - Sedimentation rate; Future - Antinuclear Antib (ANA); Future - CRP High sensitivity; Future  2. Recurrent sinusitis Rx Bactrim.  Increase fluids.  Rest.  Saline nasal spray.  Probiotic.  Mucinex as directed.  Humidifier in bedroom. ENT if symptoms are not improving.     Leeanne Rio, PA-C 03/01/2020

## 2020-03-04 ENCOUNTER — Ambulatory Visit (INDEPENDENT_AMBULATORY_CARE_PROVIDER_SITE_OTHER): Payer: 59

## 2020-03-04 ENCOUNTER — Other Ambulatory Visit: Payer: Self-pay

## 2020-03-04 DIAGNOSIS — R21 Rash and other nonspecific skin eruption: Secondary | ICD-10-CM | POA: Diagnosis not present

## 2020-03-04 LAB — CBC WITH DIFFERENTIAL/PLATELET
Basophils Absolute: 0 10*3/uL (ref 0.0–0.1)
Basophils Relative: 0.2 % (ref 0.0–3.0)
Eosinophils Absolute: 0.2 10*3/uL (ref 0.0–0.7)
Eosinophils Relative: 5.4 % — ABNORMAL HIGH (ref 0.0–5.0)
HCT: 35.4 % — ABNORMAL LOW (ref 36.0–46.0)
Hemoglobin: 11.7 g/dL — ABNORMAL LOW (ref 12.0–15.0)
Lymphocytes Relative: 38.5 % (ref 12.0–46.0)
Lymphs Abs: 1.4 10*3/uL (ref 0.7–4.0)
MCHC: 33.1 g/dL (ref 30.0–36.0)
MCV: 86.7 fl (ref 78.0–100.0)
Monocytes Absolute: 0.3 10*3/uL (ref 0.1–1.0)
Monocytes Relative: 8.7 % (ref 3.0–12.0)
Neutro Abs: 1.8 10*3/uL (ref 1.4–7.7)
Neutrophils Relative %: 47.2 % (ref 43.0–77.0)
Platelets: 300 10*3/uL (ref 150.0–400.0)
RBC: 4.08 Mil/uL (ref 3.87–5.11)
RDW: 13.5 % (ref 11.5–15.5)
WBC: 3.7 10*3/uL — ABNORMAL LOW (ref 4.0–10.5)

## 2020-03-04 LAB — SEDIMENTATION RATE: Sed Rate: 25 mm/hr — ABNORMAL HIGH (ref 0–20)

## 2020-03-04 LAB — HIGH SENSITIVITY CRP: CRP, High Sensitivity: 11.66 mg/L — ABNORMAL HIGH (ref 0.000–5.000)

## 2020-03-06 ENCOUNTER — Encounter: Payer: Self-pay | Admitting: Physician Assistant

## 2020-03-07 LAB — ANA: Anti Nuclear Antibody (ANA): POSITIVE — AB

## 2020-03-07 LAB — ANTI-NUCLEAR AB-TITER (ANA TITER): ANA Titer 1: 1:40 {titer} — ABNORMAL HIGH

## 2020-03-08 ENCOUNTER — Other Ambulatory Visit: Payer: Self-pay | Admitting: Emergency Medicine

## 2020-03-08 DIAGNOSIS — R21 Rash and other nonspecific skin eruption: Secondary | ICD-10-CM

## 2020-03-08 DIAGNOSIS — R768 Other specified abnormal immunological findings in serum: Secondary | ICD-10-CM

## 2020-03-11 ENCOUNTER — Telehealth: Payer: Self-pay | Admitting: Physician Assistant

## 2020-03-11 DIAGNOSIS — J329 Chronic sinusitis, unspecified: Secondary | ICD-10-CM

## 2020-03-11 NOTE — Telephone Encounter (Signed)
Left detailed message on patient VM that Per last note, if symptoms did not improve will refer to ENT. Referral placed.

## 2020-03-11 NOTE — Telephone Encounter (Signed)
Pt called in stating that she has completed the round of antibiotic that [facility] sent in for her. She states that that she feels like the sinus infection is coming back and it has moved to her chest.  Please advise if pt needs more medication or she needs to have another appt.?

## 2020-03-17 ENCOUNTER — Encounter: Payer: Self-pay | Admitting: Physician Assistant

## 2020-03-17 NOTE — Telephone Encounter (Signed)
She needs an in-person evaluation but with ongoing URI issues I cannot see her in office. Recommend evaluation at [facility].

## 2020-03-20 ENCOUNTER — Encounter: Payer: Self-pay | Admitting: Physician Assistant

## 2020-03-20 DIAGNOSIS — R898 Other abnormal findings in specimens from other organs, systems and tissues: Secondary | ICD-10-CM

## 2020-03-24 NOTE — Telephone Encounter (Signed)
She needs an appointment to examine.

## 2020-04-06 ENCOUNTER — Telehealth: Payer: Self-pay | Admitting: Emergency Medicine

## 2020-04-06 ENCOUNTER — Ambulatory Visit (INDEPENDENT_AMBULATORY_CARE_PROVIDER_SITE_OTHER): Payer: 59 | Admitting: Physician Assistant

## 2020-04-06 ENCOUNTER — Encounter: Payer: Self-pay | Admitting: Physician Assistant

## 2020-04-06 ENCOUNTER — Other Ambulatory Visit: Payer: Self-pay | Admitting: Emergency Medicine

## 2020-04-06 ENCOUNTER — Other Ambulatory Visit: Payer: Self-pay

## 2020-04-06 VITALS — BP 128/80 | HR 59 | Temp 98.4°F | Resp 16 | Ht 66.0 in | Wt 272.0 lb

## 2020-04-06 DIAGNOSIS — K219 Gastro-esophageal reflux disease without esophagitis: Secondary | ICD-10-CM | POA: Diagnosis not present

## 2020-04-06 DIAGNOSIS — I10 Essential (primary) hypertension: Secondary | ICD-10-CM | POA: Diagnosis not present

## 2020-04-06 DIAGNOSIS — J343 Hypertrophy of nasal turbinates: Secondary | ICD-10-CM | POA: Diagnosis not present

## 2020-04-06 MED ORDER — TRIAMCINOLONE ACETONIDE 55 MCG/ACT NA AERO
2.0000 | INHALATION_SPRAY | Freq: Every day | NASAL | 12 refills | Status: DC
Start: 2020-04-06 — End: 2020-05-02

## 2020-04-06 MED ORDER — FAMOTIDINE 40 MG PO TABS: 40.0000 mg | ORAL_TABLET | Freq: Every day | ORAL | 1 refills | Status: DC

## 2020-04-06 MED ORDER — POTASSIUM CHLORIDE CRYS ER 20 MEQ PO TBCR: 20.0000 meq | EXTENDED_RELEASE_TABLET | Freq: Every day | ORAL | 1 refills | Status: DC

## 2020-04-06 MED ORDER — RABEPRAZOLE SODIUM 20 MG PO TBEC: 20.0000 mg | DELAYED_RELEASE_TABLET | Freq: Every day | ORAL | 1 refills | Status: DC

## 2020-04-06 MED ORDER — ESOMEPRAZOLE MAGNESIUM 40 MG PO CPDR
40.0000 mg | DELAYED_RELEASE_CAPSULE | Freq: Every day | ORAL | 3 refills | Status: DC
Start: 2020-04-06 — End: 2020-04-06

## 2020-04-06 MED ORDER — HYDROCHLOROTHIAZIDE 25 MG PO TABS: 25.0000 mg | ORAL_TABLET | Freq: Every day | ORAL | 1 refills | Status: DC

## 2020-04-06 NOTE — Progress Notes (Signed)
Patient presents to clinic today ongoing issues with nasal congestion, sinus pressure and postnasal drainage.  Patient recently saw the ENT for these ongoing symptoms.  Was diagnosed with sinus inflammation and there was concern for laryngeal pharyngeal reflux.  Patient has been scheduled for CT sinuses/maxillofacial.  On flexible scoping a benign lesion was noted.  Was told this was not an urgent issue but I recommended removal to help with flow of her sinuses.  Was also discussed with her that she may need a turbinate reduction to help with ongoing symptoms.  States Nasacort was recommended but this was not sent in by her provider.  Also has an appointment pending with gastroenterology due to her reflux.  States nothing was changed by the ENT with her regimen to help with this.  Would like to discuss things to help with symptoms while she is awaiting her imaging/procedures.     Past Medical History:  Diagnosis Date  . Alcohol abuse   . Anemia   . Anxiety   . ASCUS with positive high risk HPV 11/2014  . Asthma   . Bipolar 1 disorder ([place])   . Constipation   . Depression   . Diabetes mellitus without complication ([place])   . Fibromyalgia   . GERD (gastroesophageal reflux disease)   . Heart murmur   . LGSIL (low grade squamous intraepithelial dysplasia) 11/2014   colposcopy biopsy.  recommend follow up pap in one year  . Obesity   . Pre-diabetes   . Prediabetes   . Vitamin D deficiency     Current Outpatient Medications on File Prior to Visit  Medication Sig Dispense Refill  . butalbital-acetaminophen-caffeine (FIORICET) 50-325-40 MG tablet Take 1-2 tablets by mouth every 6 (six) hours as needed for headache. 20 tablet 0  . clonazePAM (KLONOPIN) 0.5 MG tablet Take 1 tablet (0.5 mg total) by mouth 2 (two) times daily as needed. for anxiety 60 tablet 0  . DULoxetine (CYMBALTA) 60 MG capsule Take 1 capsule (60 mg total) by mouth daily. 30 capsule 3  . famotidine (PEPCID) 40 MG tablet Take  1 tablet (40 mg total) by mouth at bedtime. 30 tablet 1  . fluticasone (FLONASE) 50 MCG/ACT nasal spray Place 2 sprays into both nostrils daily. 16 g 3  . hydrochlorothiazide (HYDRODIURIL) 25 MG tablet Take 1 tablet (25 mg total) by mouth daily. 90 tablet 3  . levocetirizine (XYZAL) 5 MG tablet Take 1 tablet (5 mg total) by mouth every evening. 30 tablet 1  . metFORMIN (GLUCOPHAGE) 500 MG tablet Take 500 mg by mouth daily.    . NP THYROID 30 MG tablet Take 30 mg by mouth daily.    . potassium chloride SA (KLOR-CON) 20 MEQ tablet Take 1 tablet (20 mEq total) by mouth daily. (Patient not taking: Reported on 02/09/2020) 30 tablet 3  . predniSONE (DELTASONE) 10 MG tablet 3 tabs x3 days and then 2 tabs x3 days and then 1 tab x3 days.  Take w/ food. 18 tablet 0  . pregabalin (LYRICA) 100 MG capsule Take 1 capsule (100 mg total) by mouth 2 (two) times daily. 60 capsule 2  . sulfamethoxazole-trimethoprim (BACTRIM DS) 800-160 MG tablet Take 1 tablet by mouth 2 (two) times daily. 14 tablet 0  . Vitamin D, Ergocalciferol, (DRISDOL) 1.25 MG (50000 UNIT) CAPS capsule Take 1 capsule (50,000 Units total) by mouth every 7 (seven) days. (Patient not taking: Reported on 02/09/2020) 4 capsule 2   No current facility-administered medications on file prior to  visit.    Allergies  Allergen Reactions  . Penicillins Hives    Has patient had a PCN reaction causing immediate rash, facial/tongue/throat swelling, SOB or lightheadedness with hypotension:no Has patient had a PCN reaction causing severe rash involving mucus membranes or skin necrosis: Yes Has patient had a PCN reaction that required hospitalization No Has patient had a PCN reaction occurring within the last 10 years: No If all of the above answers are "NO", then may proceed with Cephalosporin use.     Family History  Problem Relation Age of Onset  . Lupus Mother   . Heart disease Mother   . High blood pressure Mother   . High Cholesterol Mother   .  Thyroid disease Mother   . Cancer Mother   . Depression Mother   . Anxiety disorder Mother   . Sleep apnea Mother   . Alcoholism Mother   . Obesity Mother   . Heart disease Father   . Diabetes Maternal Grandmother   . Heart disease Maternal Grandmother   . Hypertension Maternal Grandmother   . Prostate cancer Maternal Grandfather   . Alzheimer's disease Paternal Grandfather   . ADD / ADHD Son   . Colon cancer Neg Hx   . Stomach cancer Neg Hx     Social History   Socioeconomic History  . Marital status: Legally Separated    Spouse name: Not on file  . Number of children: 3  . Years of education: 10  . Highest education level: Not on file  Occupational History  . Occupation: Scientist, clinical (histocompatibility and immunogenetics)  Tobacco Use  . Smoking status: Former Smoker    Types: Cigarettes  . Smokeless tobacco: Never Used  . Tobacco comment: not smoking at all  Vaping Use  . Vaping Use: Never used  Substance and Sexual Activity  . Alcohol use: Yes    Alcohol/week: 0.0 standard drinks  . Drug use: No  . Sexual activity: Yes    Birth control/protection: Surgical    Comment: intercourse age 66, sexual partners more than 5  Other Topics Concern  . Not on file  Social History Narrative   Fun: Sleep    Social Determinants of Health   Financial Resource Strain:   . Difficulty of Paying Living Expenses: Not on file  Food Insecurity:   . Worried About Charity fundraiser in the Last Year: Not on file  . Ran Out of Food in the Last Year: Not on file  Transportation Needs:   . Lack of Transportation (Medical): Not on file  . Lack of Transportation (Non-Medical): Not on file  Physical Activity:   . Days of Exercise per Week: Not on file  . Minutes of Exercise per Session: Not on file  Stress:   . Feeling of Stress : Not on file  Social Connections:   . Frequency of Communication with Friends and Family: Not on file  . Frequency of Social Gatherings with Friends and Family: Not on file  . Attends  Religious Services: Not on file  . Active Member of Clubs or Organizations: Not on file  . Attends Archivist Meetings: Not on file  . Marital Status: Not on file    Review of Systems - See HPI.  All other ROS are negative.  Wt 272 lb (123.4 kg)   LMP  (LMP Unknown)   BMI 43.90 kg/m   Physical Exam Vitals reviewed.  Constitutional:      Appearance: Normal appearance.  HENT:  Head: Normocephalic and atraumatic.     Right Ear: Tympanic membrane normal.     Left Ear: Tympanic membrane normal.     Nose: Congestion and rhinorrhea present.     Mouth/Throat:     Mouth: Mucous membranes are moist.  Eyes:     Conjunctiva/sclera: Conjunctivae normal.     Pupils: Pupils are equal, round, and reactive to light.  Cardiovascular:     Rate and Rhythm: Normal rate and regular rhythm.     Pulses: Normal pulses.     Heart sounds: Normal heart sounds.  Abdominal:     General: Bowel sounds are normal. There is no distension.     Palpations: Abdomen is soft.     Tenderness: There is no abdominal tenderness.  Musculoskeletal:     Cervical back: Neck supple.  Neurological:     General: No focal deficit present.     Mental Status: She is alert and oriented to person, place, and time.     Recent Results (from the past 2160 hour(s))  Resp Panel by RT PCR (RSV, Flu A&B, Covid) - Nasopharyngeal Swab     Status: None   Collection Time: 01/22/20  7:57 PM   Specimen: Nasopharyngeal Swab  Result Value Ref Range   SARS Coronavirus 2 by RT PCR NEGATIVE NEGATIVE    Comment: (NOTE) SARS-CoV-2 target nucleic acids are NOT DETECTED.  The SARS-CoV-2 RNA is generally detectable in upper respiratoy specimens during the acute phase of infection. The lowest concentration of SARS-CoV-2 viral copies this assay can detect is 131 copies/mL. A negative result does not preclude SARS-Cov-2 infection and should not be used as the sole basis for treatment or other patient management decisions. A  negative result may occur with  improper specimen collection/handling, submission of specimen other than nasopharyngeal swab, presence of viral mutation(s) within the areas targeted by this assay, and inadequate number of viral copies (<131 copies/mL). A negative result must be combined with clinical observations, patient history, and epidemiological information. The expected result is Negative.  Fact Sheet for Patients:  PinkCheek.be  Fact Sheet for Healthcare Providers:  GravelBags.it  This test is no t yet approved or cleared by the [place] FDA and  has been authorized for detection and/or diagnosis of SARS-CoV-2 by FDA under an Emergency Use Authorization (EUA). This EUA will remain  in effect (meaning this test can be used) for the duration of the COVID-19 declaration under Section 564(b)(1) of the Act, 21 U.S.C. section 360bbb-3(b)(1), unless the authorization is terminated or revoked sooner.     Influenza A by PCR NEGATIVE NEGATIVE   Influenza B by PCR NEGATIVE NEGATIVE    Comment: (NOTE) The Xpert Xpress SARS-CoV-2/FLU/RSV assay is intended as an aid in  the diagnosis of influenza from Nasopharyngeal swab specimens and  should not be used as a sole basis for treatment. Nasal washings and  aspirates are unacceptable for Xpert Xpress SARS-CoV-2/FLU/RSV  testing.  Fact Sheet for Patients: PinkCheek.be  Fact Sheet for Healthcare Providers: GravelBags.it  This test is not yet approved or cleared by the [place] FDA and  has been authorized for detection and/or diagnosis of SARS-CoV-2 by  FDA under an Emergency Use Authorization (EUA). This EUA will remain  in effect (meaning this test can be used) for the duration of the  Covid-19 declaration under Section 564(b)(1) of the Act, 21  U.S.C. section 360bbb-3(b)(1), unless the authorization is   terminated or revoked.    Respiratory Syncytial Virus by PCR NEGATIVE  NEGATIVE    Comment: (NOTE) Fact Sheet for Patients: PinkCheek.be  Fact Sheet for Healthcare Providers: GravelBags.it  This test is not yet approved or cleared by the [place] FDA and  has been authorized for detection and/or diagnosis of SARS-CoV-2 by  FDA under an Emergency Use Authorization (EUA). This EUA will remain  in effect (meaning this test can be used) for the duration of the  COVID-19 declaration under Section 564(b)(1) of the Act, 21 U.S.C.  section 360bbb-3(b)(1), unless the authorization is terminated or  revoked. Performed at [facility], [place] [address] [place], [place] [address]   CBC with Differential/Platelet     Status: Abnormal   Collection Time: 03/04/20  9:14 AM  Result Value Ref Range   WBC 3.7 (L) 4.0 - 10.5 K/uL   RBC 4.08 3.87 - 5.11 Mil/uL   Hemoglobin 11.7 (L) 12.0 - 15.0 g/dL   HCT 35.4 (L) 36 - 46 %   MCV 86.7 78.0 - 100.0 fl   MCHC 33.1 30.0 - 36.0 g/dL   RDW 13.5 11.5 - 15.5 %   Platelets 300.0 150 - 400 K/uL   Neutrophils Relative % 47.2 43 - 77 %   Lymphocytes Relative 38.5 12 - 46 %   Monocytes Relative 8.7 3 - 12 %   Eosinophils Relative 5.4 (H) 0 - 5 %   Basophils Relative 0.2 0 - 3 %   Neutro Abs 1.8 1.4 - 7.7 K/uL   Lymphs Abs 1.4 0.7 - 4.0 K/uL   Monocytes Absolute 0.3 0.1 - 1.0 K/uL   Eosinophils Absolute 0.2 0.0 - 0.7 K/uL   Basophils Absolute 0.0 0.0 - 0.1 K/uL  CRP High sensitivity     Status: Abnormal   Collection Time: 03/04/20  9:14 AM  Result Value Ref Range   CRP, High Sensitivity 11.660 (H) 0.000 - 5.000 mg/L    Comment: Note:  An elevated hs-CRP (>5 mg/L) should be repeated after 2 weeks to rule out recent infection or trauma.  Antinuclear Antib (ANA)     Status: Abnormal   Collection Time: 03/04/20  9:14 AM  Result Value Ref Range   Anti Nuclear Antibody (ANA)  POSITIVE (A) NEGATIVE    Comment: ANA IFA is a first line screen for detecting the presence of up to approximately 150 autoantibodies in various autoimmune diseases. A positive ANA IFA result is suggestive of autoimmune disease and reflexes to titer and pattern. Further laboratory testing may be considered if clinically indicated. . For additional information, please refer to http://education.[facility].com/faq/FAQ177 (This link is being provided for informational/ educational purposes only.) .   Sedimentation rate     Status: Abnormal   Collection Time: 03/04/20  9:14 AM  Result Value Ref Range   Sed Rate 25 (H) 0 - 20 mm/hr  Anti-nuclear ab-titer (ANA titer)     Status: Abnormal   Collection Time: 03/04/20  9:14 AM  Result Value Ref Range   ANA Titer 1 1:40 (H) titer    Comment: A low level ANA titer may be present in pre-clinical autoimmune diseases and normal individuals.                 Reference Range                 <1:40        Negative                 1:40-1:80    Low Antibody Level                 >  1:80        Elevated Antibody Level .    ANA Pattern 1 Cytoplasmic (A)     Comment: The presence of cytoplasmic fluorescence was noted on the HEp-2 slide. Other reactivities (e.g., anti- mitochondrial antibodies or anti-smooth muscle antibodies) may be responsible for this fluorescence. The clinical significance of this finding is uncertain. Clinical correlation is recommended. . AC-15 to AC-23: Cytoplasmic . International Consensus on ANA Patterns (https://www.hernandez-brewer.com/)     Assessment/Plan: 1. Nasal turbinate hypertrophy Continue management per ENT.  We will send in the Nasacort for her but discussed this is an OTC medication.  2. Laryngopharyngeal reflux We will have her continue Pepcid nightly.  Start GERD diet and probiotic.  Rx Nexium to use daily.  Follow-up with GI as scheduled.  3. Essential hypertension Needs medication refill.   Blood pressure stable.  Asymptomatic.  Labs up-to-date.  Medication refill sent. - potassium chloride SA (KLOR-CON) 20 MEQ tablet; Take 1 tablet (20 mEq total) by mouth daily.  Dispense: 90 tablet; Refill: 1 - hydrochlorothiazide (HYDRODIURIL) 25 MG tablet; Take 1 tablet (25 mg total) by mouth daily.  Dispense: 90 tablet; Refill: 1  This visit occurred during the SARS-CoV-2 public health emergency.  Safety protocols were in place, including screening questions prior to the visit, additional usage of staff PPE, and extensive cleaning of exam room while observing appropriate contact time as indicated for disinfecting solutions.     Leeanne Rio, PA-C

## 2020-04-06 NOTE — Telephone Encounter (Signed)
Prior Authorization started on Cover My Meds for patient medication Esomeprazole Magnesium. [id number] Medication was denied by insurance PCP changing medication to Aciphex 20 mg daily instead. Aciphex sent to the pharmacy

## 2020-04-06 NOTE — Patient Instructions (Addendum)
Please follow-up for imaging as scheduled.  Follow-up with GI as scheduled.   Start the Esomeprazole once daily.  Continue Famotidine and your antihistamines. Take the nasacort each day.   Let me know how things are going.

## 2020-04-17 NOTE — Progress Notes (Signed)
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 female here for evaluation of positive ANA, fatigue, malar rash. She has been treated for recurrent sinusitis and bacterial sinusitis with doxycycline. Recent ENT evaluation suspicion for GERD and OSA as major contributors. She was seen in 2019 by Dr. Estanislado Pandy for evaluation of joint pains and elevated CRP with myofascial pain and knee osteoarthritis seen but no inflammatory treatment indicated at that time. She has continued to experience fairly generalized pain and stiffness but worst in her back and knees and ankles. She describes it as feeling like her joints are compressed. She exercises about 30 minutes per day most days which helps her stiffness. 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 In addition to joint pains she is having facial erythematous rash that comes and goes, very dry eyes and mouth for which she uses biotene mouth spray. Her hands go numb overnight and she sleeps interrupted due to pain. Her feet also go numb after sitting in one position for too long.  Recently she is also having a lot of sinusitis she has taken about 6 rounds of antibiotics and steroids this year and recent ENT evaluation showed extensive inflammation that raised suspicion for OSA and severe reflux recommending GI evaluation.   Labs reviewed 02/2020 ANA 1:40 cytoplasmic ESR 25 hsCRP 11.6  Activities of Daily Living:  Patient reports morning stiffness for 24 hours.   Patient Reports nocturnal pain.   Difficulty dressing/grooming: Reports Difficulty climbing stairs: Reports Difficulty getting out of chair: Reports Difficulty using hands for taps, buttons, cutlery, and/or writing: Reports  Review of Systems  Constitutional: Positive for fatigue.  HENT: Positive for mouth dryness and nose dryness. Negative for mouth sores.   Eyes: Positive for visual disturbance and dryness. Negative for pain and itching.  Respiratory: Positive for cough, hemoptysis, shortness of breath and difficulty breathing.   Cardiovascular: Positive for palpitations and swelling in legs/feet. Negative for chest pain.  Gastrointestinal: Positive for constipation. Negative for abdominal pain, blood in stool and diarrhea.  Endocrine: Negative for increased urination.  Genitourinary: Negative for painful urination.  Musculoskeletal: Positive for arthralgias, joint pain, joint swelling, myalgias, muscle weakness, morning stiffness, muscle tenderness and myalgias.  Skin: Positive for color change and rash. Negative for redness.  Allergic/Immunologic: Positive for susceptible to infections.  Neurological: Positive for dizziness, numbness, headaches, memory loss and weakness.  Hematological: Positive for swollen glands.  Psychiatric/Behavioral: Positive for confusion and sleep disturbance.    PMFS History:  Patient Active Problem List   Diagnosis Date Noted  . Positive ANA (antinuclear antibody) 04/18/2020  . Essential hypertension 10/06/2019  . Elevated cholesterol 08/20/2019  . COVID-19 virus infection 04/03/2019  . Obstructive sleep apnea of adult 05/15/2018  . Obesity (BMI 35.0-39.9 without comorbidity) 05/15/2018  . Hypothyroidism 05/15/2018  . Fibromyalgia 09/27/2017  . Primary osteoarthritis of both knees 09/27/2017  . Vitamin D deficiency  09/27/2017  . Leiomyoma 10/30/2016  . Prediabetes 08/21/2016  . Gastroesophageal reflux disease 08/21/2016  . Thornwaldt's cyst 05/29/2016  . Morbid obesity ([place])  11/09/2015  . Decreased attention Span 09/30/2015  . Iron deficiency anemia 10/19/2014    Past Medical History:  Diagnosis Date  . Alcohol abuse   . Anemia   . Anxiety   . ASCUS with positive high risk HPV 11/2014  . Asthma   . Bipolar 1 disorder ([place])   . Constipation   . Depression   . Diabetes mellitus without complication ([place])   . Fibromyalgia   . GERD (gastroesophageal reflux disease)   . Heart murmur   . LGSIL (low grade squamous intraepithelial dysplasia) 11/2014   colposcopy biopsy.  recommend follow up pap in one year  . Obesity   . Pre-diabetes   . Prediabetes   . Vitamin D deficiency     Family History  Problem Relation Age of Onset  . Lupus Mother   . Heart disease Mother   . High blood pressure Mother   . High Cholesterol Mother   . Thyroid disease Mother   . Cancer Mother   . Depression Mother   . Anxiety disorder Mother   . Sleep apnea Mother   . Alcoholism Mother   . Obesity Mother   . Heart disease Father   . Diabetes Maternal Grandmother   . Heart disease Maternal Grandmother   . Hypertension Maternal Grandmother   . Prostate cancer Maternal Grandfather   . Alzheimer's disease Paternal Grandfather   . ADD / ADHD Son   . Colon cancer Neg Hx   . Stomach cancer Neg Hx    Past Surgical History:  Procedure Laterality Date  . CARDIAC CATHETERIZATION    . CYSTOSCOPY  10/30/2016   Procedure: CYSTOSCOPY;  Surgeon: Anastasio Auerbach, MD;  Location: [facility];  Service: Gynecology;;  . LAPAROSCOPIC VAGINAL HYSTERECTOMY WITH SALPINGECTOMY Bilateral 10/30/2016   Procedure: LAPAROSCOPIC ASSISTED VAGINAL HYSTERECTOMY WITH SALPINGECTOMY;  Surgeon: Anastasio Auerbach, MD;  Location: [facility];  Service: Gynecology;  Laterality: Bilateral;  . TUBAL LIGATION     Social History   Social History Narrative   Fun: Sleep    Immunization History  Administered Date(s) Administered  . Influenza Inj Mdck Quad Pf 02/24/2017  . Influenza,inj,Quad PF,6+ Mos 02/24/2017,  02/14/2018  . Influenza-Unspecified 01/29/2019  . Moderna Sars-Covid-2 Vaccination 05/11/2019, 07/09/2019  . Pneumococcal Polysaccharide-23 10/31/2016     Objective: Vital Signs: BP 111/68 (BP Location: Right Arm, Patient Position: Sitting, Cuff Size: Large)   Pulse (!) 57   Ht 5' 6"  (1.676 m)   Wt 276 lb (125.2 kg)   LMP  (LMP Unknown)   BMI 44.55 kg/m    Physical Exam Constitutional:      Appearance: She is obese.  HENT:     Right Ear: External ear normal.     Left Ear: External ear normal.     Mouth/Throat:     Mouth: Mucous membranes are moist.     Pharynx: Oropharynx is clear.  Eyes:     Conjunctiva/sclera: Conjunctivae normal.  Cardiovascular:     Rate and Rhythm: Normal rate and regular rhythm.  Pulmonary:     Effort: Pulmonary effort is normal.     Breath sounds: Normal breath sounds.  Skin:    General: Skin is warm and dry.     Findings: No rash.  Neurological:     Mental Status: She is alert.      Musculoskeletal  Exam:  Neck ROM intact with pain in lateral rotation to either side Shoulder, elbow, wrist, fingers full range of motion no tenderness or swelling Paraspinal and upper back muscle tenderness to palpation Normal hip internal and external rotation without pain Knees bilateral patellofemoral crepitus Ankles, MTPs full range of motion no tenderness or swelling    Investigation: No additional findings.  Imaging: No results found.  Recent Labs: Lab Results  Component Value Date   WBC 4.5 04/18/2020   HGB 11.8 04/18/2020   PLT 303 04/18/2020   NA 136 09/29/2019   K 4.0 09/29/2019   CL 104 09/29/2019   CO2 28 09/29/2019   GLUCOSE 123 (H) 09/29/2019   BUN 12 09/29/2019   CREATININE 1.14 09/29/2019   BILITOT 0.2 09/29/2019   ALKPHOS 54 09/29/2019   AST 14 09/29/2019   ALT 12 09/29/2019   PROT 7.1 09/29/2019   ALBUMIN 4.1 09/29/2019   CALCIUM 8.9 09/29/2019   GFRAA 107 08/19/2019    Speciality Comments: No specialty comments  available.  Procedures:  No procedures performed Allergies: Penicillins   Assessment / Plan:     Visit Diagnoses: Positive ANA (antinuclear antibody) - Plan: C3 and C4, Anti-DNA antibody, double-stranded, Sedimentation rate, CBC with Differential/Platelet  Positive ANA since years ago but I do not see clinical evidence of systemic lupus or other active connective tissue disease. She seems to be having a lot of inflammation related to GI and ENT issues at this time that do not sound typical for any particular CTD either. Will check ESR, C3 C4, dsDNA, and blood count for any more specific evidence for lupus or autoimmune disease.  Fibromyalgia  She does have fibromyalgia and exacerbation of pain with work and life related stress changes is typical for this condition. She is already exercising regularly which is first line treatment. Recommended checking out [facility] fibromyalgia patient guide for additional ideas on this.  Primary osteoarthritis of both knees  She has bilateral knee OA with patellofemoral crepitus on exam but ROM seems intact and good strength. Apparently did not get much benefit with duloxetine, pregabalin, and cannot tolerate oral NSAIDs with current GI problems. No specific new treatment for this besides continued exercise.  Orders: Orders Placed This Encounter  Procedures  . C3 and C4  . Anti-DNA antibody, double-stranded  . Sedimentation rate  . CBC with Differential/Platelet   No orders of the defined types were placed in this encounter.   Follow-Up Instructions: No follow-ups on file.   Collier Salina, MD  Note - This record has been created using [employer].  Chart creation errors have been sought, but may not always  have been located. Such creation errors do not reflect on  the standard of medical care.

## 2020-04-18 ENCOUNTER — Encounter: Payer: Self-pay | Admitting: Internal Medicine

## 2020-04-18 ENCOUNTER — Other Ambulatory Visit: Payer: Self-pay

## 2020-04-18 ENCOUNTER — Ambulatory Visit: Payer: 59 | Admitting: Internal Medicine

## 2020-04-18 VITALS — BP 111/68 | HR 57 | Ht 66.0 in | Wt 276.0 lb

## 2020-04-18 DIAGNOSIS — R7689 Other specified abnormal immunological findings in serum: Secondary | ICD-10-CM | POA: Insufficient documentation

## 2020-04-18 DIAGNOSIS — M170 Bilateral primary osteoarthritis of knee: Secondary | ICD-10-CM | POA: Diagnosis not present

## 2020-04-18 DIAGNOSIS — R768 Other specified abnormal immunological findings in serum: Secondary | ICD-10-CM | POA: Insufficient documentation

## 2020-04-18 DIAGNOSIS — M797 Fibromyalgia: Secondary | ICD-10-CM | POA: Diagnosis not present

## 2020-04-18 NOTE — Patient Instructions (Signed)
I recommend checking out the [place] fibromyalgia patient guide for recommendations on exercise, sleep, diet, and relaxation for chronic pain management: [email or web address]    Anti-DNA Antibody Test Why am I having this test? The anti-DNA antibody test helps with the diagnosis and follow-up of systemic lupus erythematosus (SLE). It is also used to monitor treatment of this condition as the antibody decreases with successful therapy. What is being tested? This test measures the amount of anti-DNA antibody in the blood. This antibody is found in 65-80% of patients with active SLE. This antibody is not as common in patients who have other diseases. What kind of sample is taken?  A blood sample is required for this test. It is usually collected by inserting a needle into a blood vessel. How are the results reported? Your test results will be reported as a value. Your test results may also be reported as positive, intermediate, or negative. Your health care provider will compare your results to normal ranges that were established after testing a large group of people (reference values). Reference values may vary among labs and hospitals. For this test, common reference values are:  Positive: 10 or more international units/mL.  Intermediate: 5-9 international units/mL.  Negative: Less than 5 international units/mL. What do the results mean? Positive results, which are associated with results that are higher than the reference values, may indicate:  Autoimmune disorders such as SLE.  Infectious mononucleosis.  Chronic liver conditions. Intermediate results mean that the anti-DNA antibody levels are higher than normal, but not high enough to be considered positive. Negative results mean that you do not have the anti-DNA antibody that is associated with these conditions. Talk with your health care provider about what your results mean. Questions to ask your health care  provider Ask your health care provider, or the department that is doing the test:  When will my results be ready?  How will I get my results?  What are my treatment options?  What other tests do I need?  What are my next steps? Summary  The anti-DNA antibody test helps with the diagnosis and follow-up of systemic lupus erythematosus (SLE). It is also used to monitor treatment of this condition as the antibody decreases with successful therapy.  This test measures the amount of anti-DNA antibody in the blood.  Elevated levels of anti-DNA antibody can be seen in patients with SLE and certain other conditions.    Complement Assay Test Why am I having this test? Complement refers to a group of proteins that are part of the body's disease-fighting (immune) system. A complement assay test provides information about some or all of these proteins. You may have this test:  To diagnose a lack (deficiency) of certain complement proteins. Deficiencies can be passed from parent to child (inherited).  To monitor an infection or autoimmune disease.  If you have unexplained inflammation or swelling (edema).  If you have repeated bacterial infections. What is being tested? This test can be used to measure:  The total number of complements in your blood (total complement).  The number of each kind of complement in your blood. The nine main kinds of complement are labeled C1 through C9. Some of these components, such as C3 and C4, are particularly important and have many functions in the body. Depending on why you are having the test, your health care provider may test your total complement or only some individual complements, such as C3 and C4. The total complement assay may be  done before individual complements are tested. What kind of sample is taken?  A blood sample is required for this test. It is usually collected by inserting a needle into a blood vessel. Tell a health care provider  about:  Any allergies you have.  All medicines you are taking, including vitamins, herbs, eye drops, creams, and over-the-counter medicines.  Any blood disorders you have.  Any surgeries you have had.  Any medical conditions you have.  Whether you are pregnant or may be pregnant. How are the results reported? Your results will be reported as a value that tells you how much complement is in your blood. This will be given as units per milliliter of blood (units/mL) or as milligrams per deciliter of blood (mg/dL). Your results may be reported as total complement, or as individual complements, or both. Your health care provider will compare your results to normal ranges that were established after testing a large group of people (reference ranges). Reference ranges may vary among labs and hospitals. For this test, reference ranges for some of the most commonly measured complement assays may be:  Total complement: 30-75 units/mL.  C2: 1-4 mg/dL.  C3: 75-175 mg/dL.  C4: 22-45 units/mL. What do the results mean? Results within reference ranges are considered normal, meaning that you have a normal amount of complement in your blood. Results that are higher than the reference ranges may be caused by:  Inflammatory diseases.  Heart attack.  Cancer. Complement deficiencies, or results lower than the reference ranges, may be caused by:  Certain inherited conditions.  Autoimmune disease.  Certain liver diseases.  Malnutrition.  Certain types of anemia that result in breakdown of red blood cells (hemolytic anemia). Talk with your health care provider about what your results mean. Questions to ask your health care provider Ask your health care provider, or the department that is doing the test:  When will my results be ready?  How will I get my results?  What are my treatment options?  What other tests do I need?  What are my next steps? Summary  Complement refers to a  group of proteins that are part of the body's disease-fighting (immune) system. A complement assay test can provide information about some or all of these proteins.  A complement assay test may be done to help diagnose a complement deficiency, and to monitor certain infections or autoimmune diseases.  Talk with your health care provider about what your results mean.    Erythrocyte Sedimentation Rate Test Why am I having this test? The erythrocyte sedimentation rate (ESR) test is used to help find illnesses related to:  Sudden (acute) or long-term (chronic) infections.  Inflammation.  The body's disease-fighting system attacking healthy cells (autoimmune diseases).  Cancer.  Tissue death. If you have symptoms that may be related to any of these illnesses, your health care provider may do an ESR test before doing more specific tests. If you have an inflammatory immune disease, such as rheumatoid arthritis, you may have this test to help monitor your therapy. What is being tested? This test measures how long it takes for your red blood cells (erythrocytes) to settle in a solution over a certain amount of time (sedimentation rate). When you have an infection or inflammation, your red blood cells clump together and settle faster. The sedimentation rate provides information about how much inflammation is present in the body. What kind of sample is taken?  A blood sample is required for this test. It is usually  collected by inserting a needle into a blood vessel. How do I prepare for this test? Follow any instructions from your health care provider about changing or stopping your regular medicines. Tell a health care provider about:  Any allergies you have.  All medicines you are taking, including vitamins, herbs, eye drops, creams, and over-the-counter medicines.  Any blood disorders you have.  Any surgeries you have had.  Any medical conditions you have, such as thyroid or kidney  disease.  Whether you are pregnant or may be pregnant. How are the results reported? Your results will be reported as a value that measures sedimentation rate in millimeters per hour (mm/hr). Your health care provider will compare your results to normal ranges that were established after testing a large group of people (reference values). Reference values may vary among labs and hospitals. For this test, common reference values, which vary by age and gender, are:  Newborn: 0-2 mm/hr.  Child, up to puberty: 0-10 mm/hr.  Female: ? Under 50 years: 0-20 mm/hr. ? 50-85 years: 0-30 mm/hr. ? Over 85 years: 0-42 mm/hr.  Female: ? Under 50 years: 0-15 mm/hr. ? 50-85 years: 0-20 mm/hr. ? Over 85 years: 0-30 mm/hr. Certain conditions or medicines may cause ESR levels to be falsely lower or higher, such as:  Pregnancy.  Obesity.  Steroids, birth control pills, and blood thinners.  Thyroid or kidney disease. What do the results mean? Results that are within reference values are considered normal, meaning that the level of inflammation in your body is healthy. High ESR levels mean that there is inflammation in your body. You will have more tests to help make a diagnosis. Inflammation may result from many different conditions or injuries. Talk with your health care provider about what your results mean. Questions to ask your health care provider Ask your health care provider, or the department that is doing the test:  When will my results be ready?  How will I get my results?  What are my treatment options?  What other tests do I need?  What are my next steps? Summary  The erythrocyte sedimentation rate (ESR) test is used to help find illnesses associated with sudden (acute) or long-term (chronic) infections, inflammation, autoimmune diseases, cancer, or tissue death.  If you have symptoms that may be related to any of these illnesses, your health care provider may do an ESR test before  doing more specific tests. If you have an inflammatory immune disease, such as rheumatoid arthritis, you may have this test to help monitor your therapy.  This test measures how long it takes for your red blood cells (erythrocytes) to settle in a solution over a certain amount of time (sedimentation rate). This provides information about how much inflammation is present in the body. This information is not intended to replace advice given to you by your health care provider. Make sure you discuss any questions you have with your health care provider. Document Revised: 12/04/2016 Document Reviewed: 12/04/2016 Elsevier Patient Education  [place].

## 2020-04-19 ENCOUNTER — Encounter: Payer: Self-pay | Admitting: Physician Assistant

## 2020-04-19 LAB — C3 AND C4
C3 Complement: 163 mg/dL (ref 83–193)
C4 Complement: 43 mg/dL (ref 15–57)

## 2020-04-19 LAB — CBC WITH DIFFERENTIAL/PLATELET
Absolute Monocytes: 419 cells/uL (ref 200–950)
Basophils Absolute: 41 cells/uL (ref 0–200)
Basophils Relative: 0.9 %
Eosinophils Absolute: 207 cells/uL (ref 15–500)
Eosinophils Relative: 4.6 %
HCT: 35.2 % (ref 35.0–45.0)
Hemoglobin: 11.8 g/dL (ref 11.7–15.5)
Lymphs Abs: 1742 cells/uL (ref 850–3900)
MCH: 29 pg (ref 27.0–33.0)
MCHC: 33.5 g/dL (ref 32.0–36.0)
MCV: 86.5 fL (ref 80.0–100.0)
MPV: 10.3 fL (ref 7.5–12.5)
Monocytes Relative: 9.3 %
Neutro Abs: 2093 cells/uL (ref 1500–7800)
Neutrophils Relative %: 46.5 %
Platelets: 303 10*3/uL (ref 140–400)
RBC: 4.07 10*6/uL (ref 3.80–5.10)
RDW: 12.4 % (ref 11.0–15.0)
Total Lymphocyte: 38.7 %
WBC: 4.5 10*3/uL (ref 3.8–10.8)

## 2020-04-19 LAB — SEDIMENTATION RATE: Sed Rate: 36 mm/h — ABNORMAL HIGH (ref 0–20)

## 2020-04-19 LAB — ANTI-DNA ANTIBODY, DOUBLE-STRANDED: ds DNA Ab: <1 IU/mL

## 2020-04-25 NOTE — Progress Notes (Signed)
Lab tests for ds DNA antibodies are and complement tests are negative which indicates against having lupus. The sedimentation rate is mildly elevated this can indicate some inflammation but I do not believe it is due to an active autoimmune disease so no additional treatment or testing for that recommended.

## 2020-05-02 ENCOUNTER — Ambulatory Visit (INDEPENDENT_AMBULATORY_CARE_PROVIDER_SITE_OTHER)
Admission: RE | Admit: 2020-05-02 | Discharge: 2020-05-02 | Disposition: A | Discharge status: Home / Self Care | Payer: 59 | Source: Ambulatory Visit | Attending: Physician Assistant | Admitting: Physician Assistant

## 2020-05-02 ENCOUNTER — Encounter: Payer: Self-pay | Admitting: Physician Assistant

## 2020-05-02 ENCOUNTER — Other Ambulatory Visit: Payer: Self-pay

## 2020-05-02 ENCOUNTER — Telehealth (INDEPENDENT_AMBULATORY_CARE_PROVIDER_SITE_OTHER): Payer: 59 | Admitting: Physician Assistant

## 2020-05-02 DIAGNOSIS — R053 Chronic cough: Secondary | ICD-10-CM

## 2020-05-02 DIAGNOSIS — R0981 Nasal congestion: Secondary | ICD-10-CM | POA: Diagnosis not present

## 2020-05-02 DIAGNOSIS — J343 Hypertrophy of nasal turbinates: Secondary | ICD-10-CM | POA: Diagnosis not present

## 2020-05-02 DIAGNOSIS — R0683 Snoring: Secondary | ICD-10-CM

## 2020-05-02 MED ORDER — ALBUTEROL SULFATE HFA 108 (90 BASE) MCG/ACT IN AERS: 2.0000 | INHALATION_SPRAY | Freq: Four times a day (QID) | RESPIRATORY_TRACT | 0 refills | Status: DC | PRN

## 2020-05-02 MED ORDER — QVAR REDIHALER 40 MCG/ACT IN AERB: 2.0000 | INHALATION_SPRAY | Freq: Two times a day (BID) | RESPIRATORY_TRACT | 1 refills | Status: DC

## 2020-05-02 MED ORDER — BENZONATATE 100 MG PO CAPS: 100.0000 mg | ORAL_CAPSULE | Freq: Three times a day (TID) | ORAL | 0 refills | Status: DC | PRN

## 2020-05-02 NOTE — Progress Notes (Signed)
Virtual Visit via Video   I connected with patient on 05/02/20 at 10:00 AM EST by a video enabled telemedicine application and verified that I am speaking with the correct person using two identifiers.  Location patient: Home Location provider: [facility] Persons participating in the virtual visit: Patient, Provider, [place] (Tracy Valenzuela)  I discussed the limitations of evaluation and management by telemedicine and the availability of in person appointments. The patient expressed understanding and agreed to proceed.  Subjective:   HPI:   Patient presents via [facility] today c/o ongoing issue (several months) with chest congestion and chronic sinus symptoms. Notes still having daily nasal congestion with significant PND and increased snoring. Notes continued chest congestion and cough along with this. Denies fever, chills. Denies chest pain or SOB. States she feels like she can never get anything up. Has been keeping hydrated and restarted Mucinex over the past few days. Occasional chest tightness with wheezing. Is followed by ENT with recent evaluation. Diagnosed with nasal turbinate hypertrophy with snoring. Was started on an allergy regimen and they are getting her set up for turbinate reduction. Was told they were getting a CT of her sinuses but notes she was never contacted regarding this. States this ongoing symptoms are frustrating her and wearing her down.    ROS:   See pertinent positives and negatives per HPI.  Patient Active Problem List   Diagnosis Date Noted  . Positive ANA (antinuclear antibody) 04/18/2020  . Essential hypertension 10/06/2019  . Elevated cholesterol 08/20/2019  . COVID-19 virus infection 04/03/2019  . Obstructive sleep apnea of adult 05/15/2018  . Obesity (BMI 35.0-39.9 without comorbidity) 05/15/2018  . Hypothyroidism 05/15/2018  . Fibromyalgia 09/27/2017  . Primary osteoarthritis of both knees 09/27/2017  . Vitamin D deficiency  09/27/2017  . Leiomyoma 10/30/2016  . Prediabetes 08/21/2016  . Gastroesophageal reflux disease 08/21/2016  . Thornwaldt's cyst 05/29/2016  . Morbid obesity ([place]) 11/09/2015  . Decreased attention Span 09/30/2015  . Iron deficiency anemia 10/19/2014    Social History   Tobacco Use  . Smoking status: Former Smoker    Types: Cigarettes  . Smokeless tobacco: Never Used  . Tobacco comment: not smoking at all  Substance Use Topics  . Alcohol use: Yes    Alcohol/week: 0.0 standard drinks    Comment: Socially    Current Outpatient Medications:  .  butalbital-acetaminophen-caffeine (FIORICET) 50-325-40 MG tablet, Take 1-2 tablets by mouth every 6 (six) hours as needed for headache. (Patient not taking: Reported on 04/18/2020), Disp: 20 tablet, Rfl: 0 .  clonazePAM (KLONOPIN) 0.5 MG tablet, Take 1 tablet (0.5 mg total) by mouth 2 (two) times daily as needed. for anxiety (Patient not taking: Reported on 04/18/2020), Disp: 60 tablet, Rfl: 0 .  DULoxetine (CYMBALTA) 60 MG capsule, Take 1 capsule (60 mg total) by mouth daily. (Patient not taking: Reported on 04/18/2020), Disp: 30 capsule, Rfl: 3 .  famotidine (PEPCID) 40 MG tablet, Take 1 tablet (40 mg total) by mouth at bedtime., Disp: 90 tablet, Rfl: 1 .  hydrochlorothiazide (HYDRODIURIL) 25 MG tablet, Take 1 tablet (25 mg total) by mouth daily., Disp: 90 tablet, Rfl: 1 .  levocetirizine (XYZAL) 5 MG tablet, Take 1 tablet (5 mg total) by mouth every evening., Disp: 30 tablet, Rfl: 1 .  metFORMIN (GLUCOPHAGE) 500 MG tablet, Take 500 mg by mouth daily., Disp: , Rfl:  .  mometasone (NASONEX) 50 MCG/ACT nasal spray, Place into the nose., Disp: , Rfl:  .  NP THYROID 30  MG tablet, Take 30 mg by mouth daily., Disp: , Rfl:  .  potassium chloride SA (KLOR-CON) 20 MEQ tablet, Take 1 tablet (20 mEq total) by mouth daily. (Patient not taking: Reported on 04/18/2020), Disp: 90 tablet, Rfl: 1 .  pregabalin (LYRICA) 100 MG capsule, Take 1 capsule (100 mg  total) by mouth 2 (two) times daily. (Patient not taking: Reported on 04/18/2020), Disp: 60 capsule, Rfl: 2 .  RABEprazole (ACIPHEX) 20 MG tablet, Take 1 tablet (20 mg total) by mouth daily. (Patient not taking: Reported on 04/18/2020), Disp: 90 tablet, Rfl: 1 .  triamcinolone (NASACORT) 55 MCG/ACT AERO nasal inhaler, Place 2 sprays into the nose daily. (Patient not taking: Reported on 04/18/2020), Disp: 1 each, Rfl: 12 .  VITAMIN D PO, Take by mouth., Disp: , Rfl:  .  Vitamin D, Ergocalciferol, (DRISDOL) 1.25 MG (50000 UNIT) CAPS capsule, Take 1 capsule (50,000 Units total) by mouth every 7 (seven) days. (Patient not taking: Reported on 04/18/2020), Disp: 4 capsule, Rfl: 2  Allergies  Allergen Reactions  . Penicillins Hives    Has patient had a PCN reaction causing immediate rash, facial/tongue/throat swelling, SOB or lightheadedness with hypotension:no Has patient had a PCN reaction causing severe rash involving mucus membranes or skin necrosis: Yes Has patient had a PCN reaction that required hospitalization No Has patient had a PCN reaction occurring within the last 10 years: No If all of the above answers are "NO", then may proceed with Cephalosporin use.     Objective:   LMP  (LMP Unknown)   Patient is well-developed, well-nourished in no acute distress.  Resting comfortably at home.  Head is normocephalic, atraumatic.  No labored breathing.  Speech is clear and coherent with logical content.  Patient is alert and oriented at baseline.   Assessment and Plan:   1. Chronic cough Suspected a reactive airway at this point coupled with her chronic drainage as cause of chronic cough and other respiratory complaints. Will obtain CXR today. Start Qvar. Albuterol inhaler on hand. Referral to pulmonology will be sent if CXR negative for PFTs and further assessment.  - DG Chest 2 View; Future  2. Nasal turbinate hypertrophy 3. Snoring 4. Chronic nasal congestion Continue care per ENT.  Will proceed with CT since she is having a hard time scheduling with specialist.     Piedad Climes, PA-C 05/02/2020

## 2020-05-02 NOTE — Patient Instructions (Signed)
Instructions sent to patients MyChart.

## 2020-05-02 NOTE — Progress Notes (Signed)
I have discussed the procedure for the virtual visit with the patient who has given consent to proceed with assessment and treatment.    S , CMA     

## 2020-05-08 ENCOUNTER — Other Ambulatory Visit: Payer: Self-pay | Admitting: Physician Assistant

## 2020-05-09 ENCOUNTER — Other Ambulatory Visit: Payer: Self-pay

## 2020-05-09 DIAGNOSIS — F32A Depression, unspecified: Secondary | ICD-10-CM

## 2020-05-09 DIAGNOSIS — F419 Anxiety disorder, unspecified: Secondary | ICD-10-CM

## 2020-05-09 MED ORDER — CLONAZEPAM 0.5 MG PO TABS: 0.5000 mg | ORAL_TABLET | Freq: Two times a day (BID) | ORAL | 0 refills | Status: DC | PRN

## 2020-05-09 NOTE — Telephone Encounter (Signed)
I was going to start back due to body pain from weather. If I can a script for my Klonpin which I feels needs to go to [facility]  [address], due to my insurance. Thank you! If you could let me know when this done. Thank you kindly. [facility] for klonopin is 09/17/19 #60 with no refills LOV 05/02/20 NOV none

## 2020-05-09 NOTE — Telephone Encounter (Signed)
Medication D/C. Sent patient a Pharmacist, community message regarding the Lyrica. It is not on patient's current medication list.Per patient:  I was going to start back due to body pain from weather. If I can a script for my Klonpin which I feels needs to [facility] to [facility]  [address], due to my insurance. Thank you! If you could let me know when this done. Thank you kindly.

## 2020-05-10 ENCOUNTER — Encounter: Payer: Self-pay | Admitting: Physician Assistant

## 2020-05-10 NOTE — Telephone Encounter (Signed)
She will need to talk to her rheumatologist regarding disability paperwork for pain issues and fatigue as they are the ones currently working this up.  A referral was not already placed for gastroenterology and pulmonology by her rheumatologist, okay to place these referrals.

## 2020-05-11 ENCOUNTER — Other Ambulatory Visit: Payer: Self-pay | Admitting: Emergency Medicine

## 2020-05-11 DIAGNOSIS — K219 Gastro-esophageal reflux disease without esophagitis: Secondary | ICD-10-CM

## 2020-05-11 DIAGNOSIS — G4733 Obstructive sleep apnea (adult) (pediatric): Secondary | ICD-10-CM

## 2020-05-11 DIAGNOSIS — R0683 Snoring: Secondary | ICD-10-CM

## 2020-05-23 ENCOUNTER — Encounter: Payer: Self-pay | Admitting: Physician Assistant

## 2020-05-25 ENCOUNTER — Encounter: Payer: Self-pay | Admitting: Neurology

## 2020-05-25 ENCOUNTER — Institutional Professional Consult (permissible substitution): Payer: 59 | Admitting: Neurology

## 2020-05-26 ENCOUNTER — Other Ambulatory Visit: Payer: 59

## 2020-06-16 ENCOUNTER — Telehealth (INDEPENDENT_AMBULATORY_CARE_PROVIDER_SITE_OTHER): Payer: 59 | Admitting: Family Medicine

## 2020-06-16 DIAGNOSIS — R197 Diarrhea, unspecified: Secondary | ICD-10-CM | POA: Diagnosis not present

## 2020-06-16 DIAGNOSIS — R112 Nausea with vomiting, unspecified: Secondary | ICD-10-CM | POA: Diagnosis not present

## 2020-06-16 MED ORDER — ONDANSETRON HCL 4 MG PO TABS: 4.0000 mg | ORAL_TABLET | Freq: Three times a day (TID) | ORAL | 0 refills | Status: DC | PRN

## 2020-06-16 NOTE — Telephone Encounter (Signed)
Pt is scheduled at [facility]

## 2020-06-16 NOTE — Progress Notes (Signed)
Virtual Visit via Video Note  I connected with Syd  on 06/16/20 at  4:40 PM EST by a video enabled telemedicine application and verified that I am speaking with the correct person using two identifiers.  Location patient: home, [place] Location provider:work or home office Persons participating in the virtual visit: patient, provider  I discussed the limitations of evaluation and management by telemedicine and the availability of in person appointments. The patient expressed understanding and agreed to proceed.   HPI:  Acute telemedicine visit for NVD: -Onset: yesterday -Symptoms include: subjective fever on and off, crampywatery diarrhea, nausea, vomited yesterday, body aches, nasal congestion - chronic -Denies: CP, SOB, focal or severe abd pain,  inability to ear/drink/get out of bed, melena, hematochezia, recent travel or abx -someone at work had stomach bug recently -s/p hysterectomy, no chance of pregnancy -Pertinent past medical history: IBS, reports BS has improved -Pertinent medication allergies: penicillins -COVID-19 vaccine status: fully vaccinated for covid x 2 per patient  ROS: See pertinent positives and negatives per HPI.  Past Medical History:  Diagnosis Date  . Alcohol abuse   . Anemia   . Anxiety   . ASCUS with positive high risk HPV 11/2014  . Asthma   . Bipolar 1 disorder ([place])   . Constipation   . Depression   . Diabetes mellitus without complication ([place])   . Fibromyalgia   . GERD (gastroesophageal reflux disease)   . Heart murmur   . LGSIL (low grade squamous intraepithelial dysplasia) 11/2014   colposcopy biopsy.  recommend follow up pap in one year  . Obesity   . Pre-diabetes   . Prediabetes   . Vitamin D deficiency     Past Surgical History:  Procedure Laterality Date  . CARDIAC CATHETERIZATION    . CYSTOSCOPY  10/30/2016   Procedure: CYSTOSCOPY;  Surgeon: Anastasio Auerbach, MD;  Location: [facility];  Service: Gynecology;;  . LAPAROSCOPIC VAGINAL  HYSTERECTOMY WITH SALPINGECTOMY Bilateral 10/30/2016   Procedure: LAPAROSCOPIC ASSISTED VAGINAL HYSTERECTOMY WITH SALPINGECTOMY;  Surgeon: Anastasio Auerbach, MD;  Location: [facility];  Service: Gynecology;  Laterality: Bilateral;  . TUBAL LIGATION       Current Outpatient Medications:  .  ondansetron (ZOFRAN) 4 MG tablet, Take 1 tablet (4 mg total) by mouth every 8 (eight) hours as needed for nausea or vomiting., Disp: 20 tablet, Rfl: 0 .  albuterol (VENTOLIN HFA) 108 (90 Base) MCG/ACT inhaler, Inhale 2 puffs into the lungs every 6 (six) hours as needed for wheezing or shortness of breath., Disp: 8 g, Rfl: 0 .  beclomethasone (QVAR REDIHALER) 40 MCG/ACT inhaler, Inhale 2 puffs into the lungs 2 (two) times daily., Disp: 1 each, Rfl: 1 .  benzonatate (TESSALON) 100 MG capsule, Take 1 capsule (100 mg total) by mouth 3 (three) times daily as needed for cough., Disp: 20 capsule, Rfl: 0 .  clonazePAM (KLONOPIN) 0.5 MG tablet, Take 1 tablet (0.5 mg total) by mouth 2 (two) times daily as needed. for anxiety, Disp: 60 tablet, Rfl: 0 .  famotidine (PEPCID) 40 MG tablet, Take 1 tablet (40 mg total) by mouth at bedtime., Disp: 90 tablet, Rfl: 1 .  hydrochlorothiazide (HYDRODIURIL) 25 MG tablet, Take 1 tablet (25 mg total) by mouth daily., Disp: 90 tablet, Rfl: 1 .  levocetirizine (XYZAL) 5 MG tablet, Take 1 tablet (5 mg total) by mouth every evening., Disp: 30 tablet, Rfl: 1 .  metFORMIN (GLUCOPHAGE) 500 MG tablet, Take 500 mg by mouth daily., Disp: , Rfl:  .  mometasone (NASONEX) 50 MCG/ACT nasal spray, Place into the nose., Disp: , Rfl:  .  NP THYROID 30 MG tablet, Take 30 mg by mouth daily., Disp: , Rfl:  .  potassium chloride SA (KLOR-CON) 20 MEQ tablet, Take 1 tablet (20 mEq total) by mouth daily. (Patient not taking: Reported on 04/18/2020), Disp: 90 tablet, Rfl: 1 .  VITAMIN D PO, Take by mouth., Disp: , Rfl:  .  Vitamin D, Ergocalciferol, (DRISDOL) 1.25 MG (50000 UNIT) CAPS capsule, Take 1 capsule  (50,000 Units total) by mouth every 7 (seven) days. (Patient not taking: Reported on 04/18/2020), Disp: 4 capsule, Rfl: 2  EXAM:  VITALS per patient if applicable:  GENERAL: alert, oriented, appears well and in no acute distress  HEENT: atraumatic, conjunttiva clear, no obvious abnormalities on inspection of external nose and ears  NECK: normal movements of the head and neck  LUNGS: on inspection no signs of respiratory distress, breathing rate appears normal, no obvious gross SOB, gasping or wheezing  CV: no obvious cyanosis  MS: moves all visible extremities without noticeable abnormality  PSYCH/NEURO: pleasant and cooperative, no obvious depression or anxiety, speech and thought processing grossly intact  ASSESSMENT AND PLAN:  Discussed the following assessment and plan:  Diarrhea, unspecified type  Nausea and vomiting, intractability of vomiting not specified, unspecified vomiting type  -we discussed possible serious and likely etiologies, options for evaluation and workup, limitations of telemedicine visit vs in person visit, treatment, treatment risks and precautions. Pt prefers to treat via telemedicine empirically rather than in person at this moment.  Given recent exposure to coworker with stomach bug in her current systems, suspect gastroenteritis versus Covid versus other.  She opted to try Zofran for nausea, oral hydration, Imodium for diarrhea and a bland diet with avoidance of dairy.  Discussed Covid testing options and in case positive isolation.  Advise follow-up video visit if positive test.  Work note requested. Work/School slipped offered: provided in patient instructions  Scheduled follow up with PCP offered: Advised to contact primary care office if follow-up is needed.  She is currently transitioning from one PCP to another and plans to contact the [facility] the [place] office to schedule a new patient visit there, as it is more convenient to her. Advised to seek  prompt in person care if worsening, new symptoms arise, or if is not improving with treatment. Discussed options for inperson care if PCP office not available. Did let this patient know that I only do telemedicine on Tuesdays and Thursdays for [place]. Advised to schedule follow up visit with PCP or [facility] if any further questions or concerns to avoid delays in care.   I discussed the assessment and treatment plan with the patient. The patient was provided an opportunity to ask questions and all were answered. The patient agreed with the plan and demonstrated an understanding of the instructions.     Lucretia Kern, DO

## 2020-06-16 NOTE — Patient Instructions (Addendum)
   ---------------------------------------------------------------------------------------------------------------------------      WORK SLIP:  Patient Kolby Myung,  12/19/1975, was seen for a medical visit today, 06/16/20 . Please excuse from work for a COVID like illness. We advise 10 days minimum from the onset of symptoms (05/15/20) PLUS 1 day of no fever and improved symptoms. Will defer to employer for a sooner return to work if symptoms have resolved for greater than 24 hours and she has a negative covid test OR if it is greater than 5 days since the positive test and the patient can wear a high-quality, tight fitting mask such as N95 or KN95 at all times for an additional 5 days. Would also suggest COVID19 antigen testing is negative prior to return.  Sincerely: E-signature: Dr. Colin Benton, DO [place] Ph: [phone]   ------------------------------------------------------------------------------------------------------------------------------  -I sent the medication(s) we discussed to your pharmacy: Meds ordered this encounter  Medications  . ondansetron (ZOFRAN) 4 MG tablet    Sig: Take 1 tablet (4 mg total) by mouth every 8 (eight) hours as needed for nausea or vomiting.    Dispense:  20 tablet    Refill:  0   Get a covid test and schedule prompt follow up video visit through your primary care doctor or through [facility] health if your Covid test is positive.  Drink plenty of fluids.  Eat a bland diet and avoid dairy.  Can try imodium per instructions if any further diarrhea.  Stay home while sick and until symptoms have resolved for 24-48 hours, unless to seek medical care. If covid test is positive stay home for 10 days unless to seek medical care.   I hope you are feeling better soon!  Seek in person care promptly if your symptoms worsen, new concerns arise or you are not improving with treatment.  It was nice to meet you today. I help  [place] out with telemedicine visits on Tuesdays and Thursdays and am available for visits on those days. If you have any concerns or questions following this visit please schedule a follow up visit with your Primary Care doctor or seek care at a local urgent care clinic to avoid delays in care.

## 2020-07-03 ENCOUNTER — Other Ambulatory Visit: Payer: Self-pay | Admitting: Physician Assistant

## 2020-07-03 DIAGNOSIS — F32A Depression, unspecified: Secondary | ICD-10-CM

## 2020-07-03 DIAGNOSIS — F419 Anxiety disorder, unspecified: Secondary | ICD-10-CM

## 2020-07-04 ENCOUNTER — Other Ambulatory Visit: Payer: Self-pay | Admitting: Chiropractic Medicine

## 2020-07-04 ENCOUNTER — Other Ambulatory Visit: Payer: Self-pay

## 2020-07-04 ENCOUNTER — Ambulatory Visit
Admission: RE | Admit: 2020-07-04 | Discharge: 2020-07-04 | Disposition: A | Discharge status: Home / Self Care | Payer: 59 | Source: Ambulatory Visit | Attending: Chiropractic Medicine | Admitting: Chiropractic Medicine

## 2020-07-04 ENCOUNTER — Encounter (INDEPENDENT_AMBULATORY_CARE_PROVIDER_SITE_OTHER): Payer: Self-pay | Admitting: Bariatrics

## 2020-07-04 DIAGNOSIS — M542 Cervicalgia: Secondary | ICD-10-CM

## 2020-07-04 DIAGNOSIS — M549 Dorsalgia, unspecified: Secondary | ICD-10-CM

## 2020-07-04 NOTE — Telephone Encounter (Signed)
Clonazepam last rx 05/09/20 #60 LOV: 05/02/20 No pending appointment

## 2020-07-13 MED ORDER — CLONAZEPAM 0.5 MG PO TABS: 0.5000 mg | ORAL_TABLET | Freq: Two times a day (BID) | ORAL | 0 refills | Status: DC | PRN

## 2020-07-26 ENCOUNTER — Encounter: Payer: Self-pay | Admitting: Gastroenterology

## 2020-07-26 ENCOUNTER — Ambulatory Visit: Payer: 59 | Admitting: Gastroenterology

## 2020-07-26 ENCOUNTER — Other Ambulatory Visit (INDEPENDENT_AMBULATORY_CARE_PROVIDER_SITE_OTHER): Payer: 59

## 2020-07-26 VITALS — BP 138/80 | HR 66 | Wt 273.0 lb

## 2020-07-26 DIAGNOSIS — K219 Gastro-esophageal reflux disease without esophagitis: Secondary | ICD-10-CM

## 2020-07-26 DIAGNOSIS — R1310 Dysphagia, unspecified: Secondary | ICD-10-CM

## 2020-07-26 DIAGNOSIS — K5900 Constipation, unspecified: Secondary | ICD-10-CM

## 2020-07-26 DIAGNOSIS — K920 Hematemesis: Secondary | ICD-10-CM

## 2020-07-26 DIAGNOSIS — R1013 Epigastric pain: Secondary | ICD-10-CM

## 2020-07-26 DIAGNOSIS — F1011 Alcohol abuse, in remission: Secondary | ICD-10-CM

## 2020-07-26 LAB — HEPATIC FUNCTION PANEL
ALT: 12 U/L (ref 0–35)
AST: 12 U/L (ref 0–37)
Albumin: 4.1 g/dL (ref 3.5–5.2)
Alkaline Phosphatase: 53 U/L (ref 39–117)
Bilirubin, Direct: 0 mg/dL (ref 0.0–0.3)
Total Bilirubin: 0.3 mg/dL (ref 0.2–1.2)
Total Protein: 7.8 g/dL (ref 6.0–8.3)

## 2020-07-26 MED ORDER — ESOMEPRAZOLE MAGNESIUM 40 MG PO CPDR: 40.0000 mg | DELAYED_RELEASE_CAPSULE | Freq: Two times a day (BID) | ORAL | Status: DC

## 2020-07-26 MED ORDER — POLYETHYLENE GLYCOL 3350 17 G PO PACK: 17.0000 g | PACK | Freq: Every day | ORAL | 0 refills | Status: DC

## 2020-07-26 NOTE — Patient Instructions (Addendum)
If you are age 43 or older, your body mass index should be between 23-30. Your Body mass index is 44.06 kg/m. If this is out of the aforementioned range listed, please consider follow up with your Primary Care Provider.  If you are age 43 or younger, your body mass index should be between 19-25. Your Body mass index is 44.06 kg/m. If this is out of the aformentioned range listed, please consider follow up with your Primary Care Provider.   Please go to the lab in the basement of our building to have lab work done as you leave today. Hit "B" for basement when you get on the elevator.  When the doors open the lab is on your left.  We will call you with the results. Thank you.   Due to recent changes in healthcare laws, you may see the results of your imaging and laboratory studies on MyChart before your provider has had a chance to review them.  We understand that in some cases there may be results that are confusing or concerning to you. Not all laboratory results come back in the same time frame and the provider may be waiting for multiple results in order to interpret others.  Please give [place] 48 hours in order for your provider to thoroughly review all the results before contacting the office for clarification of your results.    You have been scheduled for an endoscopy. Please follow written instructions given to you at your visit today. If you use inhalers (even only as needed), please bring them with you on the day of your procedure.  ____________________________________________________________________________  You have been scheduled for an abdominal ultrasound at [facility] (1st floor of hospital) on at Friday, 4-1 at 9:30am. Please arrive 15 minutes prior to your appointment for registration. Make certain not to have anything to eat or drink 6 hours prior to your appointment. Should you need to reschedule your appointment, please contact radiology at [phone]. This test  typically takes about 30 minutes to perform. ______________________________________________________________________________   Increase Nexium to twice daily. Please let the pharmacy know when you need a refill.  Please purchase the following medications over the counter and take as directed: Miralax- Take as directed Daily  Thank you for entrusting me with your care and for choosing [facility], Dr. Bright Cellar

## 2020-07-26 NOTE — Progress Notes (Signed)
HPI :  45 year old female, with a history of GERD, reported IBS, diabetes, alcohol abuse, here to reestablish care for multiple GI tract symptoms as outlined below, referred by [name], PA.  She was last seen by me in April 2018, was 4 years ago.  At her last visit she had acute onset of diarrhea following antibiotic use.  She was found to have C. difficile and was treated with Flagyl.  She responded well to that, course was complicated by thrush which was treated as well.  She states that resolved her diarrhea at the time.   Main issue for her today that she wants evaluated his reflux.  She states she has had some swelling in her throat and was found to have enlarged inflamed tonsils.  She has been seen by ENT who is concerned that reflux is driving the tonsil inflammation.  She states her symptoms have been ongoing for 6 months.  She does endorse pyrosis occasionally as well as regurgitation.  She has been on 40 mg of Nexium once daily for 2 months, as well as Pepcid 40 mg nightly.  She is also been avoiding trigger foods which can often precipitate symptoms.  She states symptoms have improved on this regimen but not resolved.  She continues to have occasional pyrosis and regurgitation.  She does have dysphagia mostly with solids, can occur with liquids at times as well.  She states this happens fairly frequently and feels it in her upper chest to her throat area.  She does have nausea when she wakes up in the morning and occasionally will have emesis that contained some bright red blood.  This usually occurs in the morning and not necessarily after eating.  She does have some nausea after she eats as well as some postprandial upper abdominal pain as well.  Feels it in the epigastric and right upper quadrant area.  No routine NSAID use.  She does state that she used to drink heavily, has been drinking since age 58, upwards of 2 bottles of wine per night or other alcohol.  She states she felt  quite poorly when drinking several months ago, she stopped drinking altogether about 6 months ago and is feeling much better since doing so.  She is worried that she has done some damage to her liver from drinking alcohol and wants this evaluated.  She denies any family history of liver disease or cirrhosis.  Her LFTs when last checked a year ago were normal.  She does have her gallbladder.  Regarding her bowel function she states she has had some longstanding constipation.  She will try to have a bowel movement once a day but often struggles to produce stool.  Does not see any blood in her stools.  She is never had a prior colonoscopy.  She has tried some herbal laxatives over-the-counter for relief at times.  Her mother she states has cancer but she does not know what type, she is working on finding out more information.  No FH of colon cancer. No Crohns or colitis in the family.   No prior colonoscopy or upper endoscopy.   Past Medical History:  Diagnosis Date  . Alcohol abuse   . Anemia   . Anxiety   . ASCUS with positive high risk HPV 11/2014  . Asthma   . Bipolar 1 disorder ([place])   . Constipation   . Depression   . Diabetes mellitus without complication ([place])   . Fibromyalgia   . GERD (  gastroesophageal reflux disease)   . Heart murmur   . LGSIL (low grade squamous intraepithelial dysplasia) 11/2014   colposcopy biopsy.  recommend follow up pap in one year  . Obesity   . Pre-diabetes   . Prediabetes   . Vitamin D deficiency      Past Surgical History:  Procedure Laterality Date  . CARDIAC CATHETERIZATION    . CYSTOSCOPY  10/30/2016   Procedure: CYSTOSCOPY;  Surgeon: Anastasio Auerbach, MD;  Location: [facility];  Service: Gynecology;;  . LAPAROSCOPIC VAGINAL HYSTERECTOMY WITH SALPINGECTOMY Bilateral 10/30/2016   Procedure: LAPAROSCOPIC ASSISTED VAGINAL HYSTERECTOMY WITH SALPINGECTOMY;  Surgeon: Anastasio Auerbach, MD;  Location: [facility];  Service: Gynecology;  Laterality:  Bilateral;  . TUBAL LIGATION     Family History  Problem Relation Age of Onset  . Lupus Mother   . Heart disease Mother   . High blood pressure Mother   . High Cholesterol Mother   . Thyroid disease Mother   . Cancer Mother   . Depression Mother   . Anxiety disorder Mother   . Sleep apnea Mother   . Alcoholism Mother   . Obesity Mother   . Heart disease Father   . Diabetes Maternal Grandmother   . Heart disease Maternal Grandmother   . Hypertension Maternal Grandmother   . Prostate cancer Maternal Grandfather   . Alzheimer's disease Paternal Grandfather   . ADD / ADHD Son   . Colon cancer Neg Hx   . Stomach cancer Neg Hx    Social History   Tobacco Use  . Smoking status: Former Smoker    Types: Cigarettes  . Smokeless tobacco: Never Used  . Tobacco comment: not smoking at all  Vaping Use  . Vaping Use: Never used  Substance Use Topics  . Alcohol use: Yes    Alcohol/week: 0.0 standard drinks    Comment: Socially  . Drug use: No   Current Outpatient Medications  Medication Sig Dispense Refill  . albuterol (VENTOLIN HFA) 108 (90 Base) MCG/ACT inhaler Inhale 2 puffs into the lungs every 6 (six) hours as needed for wheezing or shortness of breath. 8 g 0  . beclomethasone (QVAR REDIHALER) 40 MCG/ACT inhaler Inhale 2 puffs into the lungs 2 (two) times daily. 1 each 1  . benzonatate (TESSALON) 100 MG capsule Take 1 capsule (100 mg total) by mouth 3 (three) times daily as needed for cough. 20 capsule 0  . clonazePAM (KLONOPIN) 0.5 MG tablet Take 1 tablet (0.5 mg total) by mouth 2 (two) times daily as needed. for anxiety 60 tablet 0  . famotidine (PEPCID) 40 MG tablet Take 1 tablet (40 mg total) by mouth at bedtime. 90 tablet 1  . hydrochlorothiazide (HYDRODIURIL) 25 MG tablet Take 1 tablet (25 mg total) by mouth daily. 90 tablet 1  . levocetirizine (XYZAL) 5 MG tablet Take 1 tablet (5 mg total) by mouth every evening. 30 tablet 1  . metFORMIN (GLUCOPHAGE) 500 MG tablet Take  500 mg by mouth daily.    . mometasone (NASONEX) 50 MCG/ACT nasal spray Place into the nose.    . NP THYROID 30 MG tablet Take 30 mg by mouth daily.    . ondansetron (ZOFRAN) 4 MG tablet Take 1 tablet (4 mg total) by mouth every 8 (eight) hours as needed for nausea or vomiting. 20 tablet 0  . potassium chloride SA (KLOR-CON) 20 MEQ tablet Take 1 tablet (20 mEq total) by mouth daily. (Patient not taking: Reported on 04/18/2020) 90 tablet  1  . VITAMIN D PO Take by mouth.    . Vitamin D, Ergocalciferol, (DRISDOL) 1.25 MG (50000 UNIT) CAPS capsule Take 1 capsule (50,000 Units total) by mouth every 7 (seven) days. (Patient not taking: Reported on 04/18/2020) 4 capsule 2   No current facility-administered medications for this visit.   Allergies  Allergen Reactions  . Penicillins Hives    Has patient had a PCN reaction causing immediate rash, facial/tongue/throat swelling, SOB or lightheadedness with hypotension:no Has patient had a PCN reaction causing severe rash involving mucus membranes or skin necrosis: Yes Has patient had a PCN reaction that required hospitalization No Has patient had a PCN reaction occurring within the last 10 years: No If all of the above answers are "NO", then may proceed with Cephalosporin use.      Review of Systems: All systems reviewed and negative except where noted in HPI.   Lab Results  Component Value Date   WBC 4.5 04/18/2020   HGB 11.8 04/18/2020   HCT 35.2 04/18/2020   MCV 86.5 04/18/2020   PLT 303 04/18/2020    Lab Results  Component Value Date   CREATININE 1.14 09/29/2019   BUN 12 09/29/2019   NA 136 09/29/2019   K 4.0 09/29/2019   CL 104 09/29/2019   CO2 28 09/29/2019    Lab Results  Component Value Date   ALT 12 09/29/2019   AST 14 09/29/2019   ALKPHOS 54 09/29/2019   BILITOT 0.2 09/29/2019      Physical Exam: BP 138/80   Pulse 66   Wt 273 lb (123.8 kg)   LMP  (LMP Unknown)   BMI 44.06 kg/m  Constitutional:  Pleasant,well-developed, female in no acute distress. HEENT: Normocephalic and atraumatic. Conjunctivae are normal. No scleral icterus. Neck supple.  Cardiovascular: Normal rate, regular rhythm.  Pulmonary/chest: Effort normal and breath sounds normal. No wheezing, rales or rhonchi. Abdominal: Soft, nondistended, protuberant, mild epigastric / RUQ pain.  There are no masses palpable. Extremities: no edema Neurological: Alert and oriented to person place and time. Skin: Skin is warm and dry. No rashes noted. Psychiatric: Normal mood and affect. Behavior is normal.   ASSESSMENT AND PLAN: 45 year old female here to reestablish for the following issues:  GERD / Dysphagia / Abdominal pain / Hematemesis - symptoms of reflux which have improved with Nexium once daily and Pepcid at night however continues to have some bothersome symptoms.  ENT has evaluated her and feels that reflux could be contributing to tonsil inflammation.  She also has some periodic dysphagia as outlined above in addition to postprandial abdominal pain with episodic scant hematemesis.  We discussed reflux in general, she is avoiding trigger foods as best she can.  We discussed management for this.  Given her ongoing symptoms I am recommending we increase her Nexium to 40 mg twice daily to see if she finds any improvement with high-dose PPI.  Given the constellation of symptoms I think an EGD is reasonable at this point time, assess for erosive esophagitis, evaluate for stricture, can perform dilation if needed, rule out PUD and other causes for hematemesis.  I discussed risks and benefits of endoscopy and anesthesia with her and she wants to proceed.  Further recommendations pending the results.  I will also obtain right upper quadrant ultrasound to screen for gallstones to rule out that as cause of pain, will also evaluate for steatosis or damage from alcohol abuse as below.  Constipation - longstanding, no alarm symptoms.  Recommend  trial  of MiraLAX daily and can titrate up or down as needed.  Coupon provided.  Due for colonoscopy age 49 for screening purposes  History of alcohol abuse - heavy alcohol use over years.  As above will obtain right upper quadrant ultrasound.  Will obtain LFTs to make sure normal.  Patient has felt much better since stopping alcohol, encouraged her to continue to abstain.  [place] Cellar, MD [facility]  CC: Brunetta Jeans, PA-C

## 2020-07-29 ENCOUNTER — Ambulatory Visit (HOSPITAL_COMMUNITY): Admission: RE | Admit: 2020-07-29 | Payer: 59 | Source: Ambulatory Visit

## 2020-08-02 ENCOUNTER — Ambulatory Visit (AMBULATORY_SURGERY_CENTER): Payer: 59 | Admitting: Gastroenterology

## 2020-08-02 ENCOUNTER — Other Ambulatory Visit: Payer: Self-pay

## 2020-08-02 ENCOUNTER — Encounter: Payer: Self-pay | Admitting: Gastroenterology

## 2020-08-02 VITALS — BP 169/73 | HR 61 | Temp 98.4°F | Resp 16 | Ht 66.0 in | Wt 273.0 lb

## 2020-08-02 DIAGNOSIS — K2951 Unspecified chronic gastritis with bleeding: Secondary | ICD-10-CM | POA: Diagnosis not present

## 2020-08-02 DIAGNOSIS — K920 Hematemesis: Secondary | ICD-10-CM

## 2020-08-02 DIAGNOSIS — R1310 Dysphagia, unspecified: Secondary | ICD-10-CM

## 2020-08-02 DIAGNOSIS — K2970 Gastritis, unspecified, without bleeding: Secondary | ICD-10-CM | POA: Diagnosis not present

## 2020-08-02 DIAGNOSIS — K219 Gastro-esophageal reflux disease without esophagitis: Secondary | ICD-10-CM

## 2020-08-02 MED ORDER — SODIUM CHLORIDE 0.9 % IV SOLN
500.0000 mL | Freq: Once | INTRAVENOUS | Status: DC
Start: 2020-08-02 — End: 2020-08-02

## 2020-08-02 NOTE — Patient Instructions (Signed)
YOU HAD AN ENDOSCOPIC PROCEDURE TODAY AT THE [facility]:   Refer to the procedure report that was given to you for any specific questions about what was found during the examination.  If the procedure report does not answer your questions, please call your gastroenterologist to clarify.  If you requested that your care partner not be given the details of your procedure findings, then the procedure report has been included in a sealed envelope for you to review at your convenience later.  YOU SHOULD EXPECT: Some feelings of bloating in the abdomen. Passage of more gas than usual.  Walking can help get rid of the air that was put into your GI tract during the procedure and reduce the bloating. If you had a lower endoscopy (such as a colonoscopy or flexible sigmoidoscopy) you may notice spotting of blood in your stool or on the toilet paper. If you underwent a bowel prep for your procedure, you may not have a normal bowel movement for a few days.  Please Note:  You might notice some irritation and congestion in your nose or some drainage.  This is from the oxygen used during your procedure.  There is no need for concern and it should clear up in a day or so.  SYMPTOMS TO REPORT IMMEDIATELY:    Following upper endoscopy (EGD)  Vomiting of blood or coffee ground material  New chest pain or pain under the shoulder blades  Painful or persistently difficult swallowing  New shortness of breath  Fever of 100F or higher  Black, tarry-looking stools  For urgent or emergent issues, a gastroenterologist can be reached at any hour by calling [phone]. Do not use MyChart messaging for urgent concerns.    DIET:  We do recommend a small meal at first, but then you may proceed to your regular diet.  Drink plenty of fluids but you should avoid alcoholic beverages for 24 hours.  ACTIVITY:  You should plan to take it easy for the rest of today and you should NOT DRIVE or use heavy machinery  until tomorrow (because of the sedation medicines used during the test).    FOLLOW UP: Our staff will call the number listed on your records 48-72 hours following your procedure to check on you and address any questions or concerns that you may have regarding the information given to you following your procedure. If we do not reach you, we will leave a message.  We will attempt to reach you two times.  During this call, we will ask if you have developed any symptoms of COVID 19. If you develop any symptoms (ie: fever, flu-like symptoms, shortness of breath, cough etc.) before then, please call [phone].  If you test positive for Covid 19 in the 2 weeks post procedure, please call and report this information to us.    If any biopsies were taken you will be contacted by phone or by letter within the next 1-3 weeks.  Please call us at [phone] if you have not heard about the biopsies in 3 weeks.    SIGNATURES/CONFIDENTIALITY: You and/or your care partner have signed paperwork which will be entered into your electronic medical record.  These signatures attest to the fact that that the information above on your After Visit Summary has been reviewed and is understood.  Full responsibility of the confidentiality of this discharge information lies with you and/or your care-partner. 

## 2020-08-02 NOTE — Progress Notes (Signed)
pt tolerated well. VSS. awake and to recovery. Report given to RN.  Bite block placed while awake to ensure comfort. Atraumatic. Bite block left insitu to recovery. Nasal trumpet removed atraumatic.

## 2020-08-02 NOTE — Progress Notes (Signed)
CHECK-IN-AER  VITAL SIGNS-CW

## 2020-08-02 NOTE — Op Note (Signed)
[facility] Patient Name: Tracy Valenzuela Procedure Date: 08/02/2020 10:25 AM MRN: [id number] Endoscopist: Remo Lipps P.  , MD Age: 45 Referring MD:  Date of Birth: 1975-05-19 Gender: Female Account #: [id number] Procedure:                Upper GI endoscopy Indications:              Epigastric abdominal pain, Dysphagia, Suspected                            gastro-esophageal reflux disease, nausea with                            vomiting, history of hematemesis - previously with                            persistent symptoms despite nexium 40mg  once daily,                            now with improvement on twice daily nexium dosing Medicines:                Monitored Anesthesia Care Procedure:                Pre-Anesthesia Assessment:                           - Prior to the procedure, a History and Physical                            was performed, and patient medications and                            allergies were reviewed. The patient's tolerance of                            previous anesthesia was also reviewed. The risks                            and benefits of the procedure and the sedation                            options and risks were discussed with the patient.                            All questions were answered, and informed consent                            was obtained. Prior Anticoagulants: The patient has                            taken no previous anticoagulant or antiplatelet                            agents. ASA Grade Assessment: III - A patient with  severe systemic disease. After reviewing the risks                            and benefits, the patient was deemed in                            satisfactory condition to undergo the procedure.                           After obtaining informed consent, the endoscope was                            passed under direct vision. Throughout the                             procedure, the patient's blood pressure, pulse, and                            oxygen saturations were monitored continuously. The                            Endoscope was introduced through the mouth, and                            advanced to the second part of duodenum. The upper                            GI endoscopy was accomplished without difficulty.                            The patient tolerated the procedure well. Scope In: Scope Out: Findings:                 Esophagogastric landmarks were identified: the                            Z-line was found at 40 cm, the gastroesophageal                            junction was found at 40 cm and the upper extent of                            the gastric folds was found at 40 cm from the                            incisors.                           The exam of the esophagus was otherwise normal. No                            overt stricture / stenosis appreciated, no  inflammatory changes.                           A guidewire was placed and the scope was withdrawn.                            Empiric dilation was performed in the entire                            esophagus with a Savary dilator with mild                            resistance at 18 mm. Relook endoscopy showed no                            mucosal wrents. Biopsies were taken with a cold                            forceps in the upper third of the esophagus, in the                            middle third of the esophagus and in the lower                            third of the esophagus for histology.                           The entire examined stomach was normal. Biopsies                            were taken with a cold forceps for Helicobacter                            pylori testing.                           The duodenal bulb and second portion of the                            duodenum were normal. Complications:            No  immediate complications. Estimated blood loss:                            Minimal. Estimated Blood Loss:     Estimated blood loss was minimal. Impression:               - Esophagogastric landmarks identified.                           - Normal esophagus otherwise - empiric dilation                            performed to 61mm given symptoms of dysphagia,  biopsies obtained.                           - Normal stomach. Biopsied.                           - Normal duodenal bulb and second portion of the                            duodenum.                           No overt cause for symptoms of hematemesis on this                            exam. Recommendation:           - Patient has a contact number available for                            emergencies. The signs and symptoms of potential                            delayed complications were discussed with the                            patient. Return to normal activities tomorrow.                            Written discharge instructions were provided to the                            patient.                           - Resume previous diet.                           - Continue present medications.                           - Continue nexium 40mg  twice daily if that has                            provided benefit                           - Await pathology results. Remo Lipps P. , MD 08/02/2020 10:50:40 AM This report has been signed electronically.

## 2020-08-03 ENCOUNTER — Institutional Professional Consult (permissible substitution): Payer: 59 | Admitting: Pulmonary Disease

## 2020-08-04 ENCOUNTER — Telehealth: Payer: Self-pay

## 2020-08-04 ENCOUNTER — Telehealth: Payer: Self-pay | Admitting: *Deleted

## 2020-08-04 NOTE — Telephone Encounter (Signed)
Attempted f/u phone call. Line busy.

## 2020-08-04 NOTE — Telephone Encounter (Signed)
  Follow up Call-  Call back number 08/02/2020  Post procedure Call Back phone  # [phone]  Permission to leave phone message Yes  Some recent data might be hidden     First attempt for follow up phone call. No answer at number given.  Left message on voicemail.   Only 1 f/u call attempt d/t phone outage.

## 2020-09-02 ENCOUNTER — Other Ambulatory Visit: Payer: Self-pay

## 2020-09-02 ENCOUNTER — Telehealth (INDEPENDENT_AMBULATORY_CARE_PROVIDER_SITE_OTHER): Payer: 59 | Admitting: Family Medicine

## 2020-09-02 DIAGNOSIS — J309 Allergic rhinitis, unspecified: Secondary | ICD-10-CM | POA: Diagnosis not present

## 2020-09-02 DIAGNOSIS — J45901 Unspecified asthma with (acute) exacerbation: Secondary | ICD-10-CM

## 2020-09-02 DIAGNOSIS — R059 Cough, unspecified: Secondary | ICD-10-CM | POA: Diagnosis not present

## 2020-09-02 MED ORDER — POLYETHYLENE GLYCOL 3350 17 G PO PACK: 17.0000 g | PACK | Freq: Every day | ORAL | 2 refills | Status: DC

## 2020-09-02 MED ORDER — PREDNISONE 20 MG PO TABS: 40.0000 mg | ORAL_TABLET | Freq: Every day | ORAL | 0 refills | Status: DC

## 2020-09-02 MED ORDER — ESOMEPRAZOLE MAGNESIUM 40 MG PO CPDR: 40.0000 mg | DELAYED_RELEASE_CAPSULE | Freq: Two times a day (BID) | ORAL | 2 refills | Status: DC

## 2020-09-02 NOTE — Progress Notes (Signed)
Virtual Visit via Video Note  I connected with Archie Patten on 09/02/20 at 12:51 PM by a video enabled telemedicine application. Initial video working, no audio from pt but she heard me. Repeat connection ok.  verified that I am speaking with the correct person using two identifiers.  Patient location:home  My location: office - [place]    I discussed the limitations, risks, security and privacy concerns of performing an evaluation and management service by telephone and the availability of in person appointments. I also discussed with the patient that there may be a patient responsible charge related to this service. The patient expressed understanding and agreed to proceed, consent obtained  Chief complaint:  Chief Complaint  Patient presents with  . Nasal Congestion    Pt notes congestion, some general pain /fullness in her face, a headache, pt notes sxs started apx 3 days ago, pt has tried OTC but has not helped much other than musinex breaking up congestion but not helping with pain      History of Present Illness: Romanita Fager is a 45 y.o. female  Nasal congestion: Initial symptoms 3 days ago.  Sinus congestion, bad cough, increased mucus, chest congestion. No fever. Some Headache. Morning nausea, no vomiting. No abd pain. No diarrhea.  Decreased appetite, but drinking fluids.  Had negative covid test 2 days ago (1 day after sx's started). No repeat test. Off work next few days.  Dyspnea with walking, not at rest. Initial chest tightness, more short of breath - that has improved. Takes Qvar BID. Has needed albuterol 2 times per day. Same wheezing today - still hears wheezing at rest.  Clear mucus.  Less shortness of breath, but still short of breath with walking. Worse wheezing at night.  Hx of allergies, worse with pollen. Has been gardening at work - day prior to current symptoms. xyzal daily for allergies, has not used nasonex recently.    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   COVID-vaccine Moderna January 2021, March 2021. Patient Active Problem List   Diagnosis Date Noted  . Positive ANA (antinuclear antibody) 04/18/2020  . Essential hypertension 10/06/2019  . Elevated cholesterol 08/20/2019  . COVID-19 virus infection 04/03/2019  . Obstructive sleep apnea of adult 05/15/2018  . Obesity (BMI 35.0-39.9 without comorbidity) 05/15/2018  . Hypothyroidism 05/15/2018  . Fibromyalgia 09/27/2017  . Primary osteoarthritis of both knees 09/27/2017  . Vitamin D deficiency 09/27/2017  . Leiomyoma 10/30/2016  . Prediabetes 08/21/2016  . Gastroesophageal reflux disease 08/21/2016  . Thornwaldt's cyst 05/29/2016  . Morbid obesity ([place]) 11/09/2015  . Decreased attention Span 09/30/2015  . Iron deficiency anemia 10/19/2014   Past Medical History:  Diagnosis Date  . Alcohol abuse   . Allergy    SEASONAL  . Anemia   . Anxiety   . Arthritis    BACK,KNESS-BILATERAL  . ASCUS with positive high risk HPV 11/2014  . Asthma   . Bipolar 1 disorder ([place])   . Constipation   . Depression   . Diabetes mellitus without complication ([place])   . Fibromyalgia   . GERD (gastroesophageal reflux disease)   . Heart murmur   . Hypertension   . LGSIL (low grade squamous intraepithelial dysplasia) 11/2014   colposcopy biopsy.  recommend follow up pap in one year  . Obesity   . Pre-diabetes   . Prediabetes   . Thyroid disease   . Vitamin D deficiency  Past Surgical History:  Procedure Laterality Date  . CARDIAC CATHETERIZATION    . CYSTOSCOPY  10/30/2016   Procedure: CYSTOSCOPY;  Surgeon: Anastasio Auerbach, MD;  Location: [facility];  Service: Gynecology;;  . LAPAROSCOPIC VAGINAL HYSTERECTOMY WITH SALPINGECTOMY Bilateral 10/30/2016   Procedure: LAPAROSCOPIC ASSISTED VAGINAL HYSTERECTOMY WITH SALPINGECTOMY;  Surgeon: Anastasio Auerbach, MD;  Location: [facility];  Service: Gynecology;  Laterality:  Bilateral;  . TUBAL LIGATION     Allergies  Allergen Reactions  . Penicillins Hives    Has patient had a PCN reaction causing immediate rash, facial/tongue/throat swelling, SOB or lightheadedness with hypotension:no Has patient had a PCN reaction causing severe rash involving mucus membranes or skin necrosis: Yes Has patient had a PCN reaction that required hospitalization No Has patient had a PCN reaction occurring within the last 10 years: No If all of the above answers are "NO", then may proceed with Cephalosporin use.    Prior to Admission medications   Medication Sig Start Date End Date Taking? Authorizing Provider  albuterol (VENTOLIN HFA) 108 (90 Base) MCG/ACT inhaler Inhale 2 puffs into the lungs every 6 (six) hours as needed for wheezing or shortness of breath. 05/02/20  Yes Brunetta Jeans, PA-C  beclomethasone (QVAR REDIHALER) 40 MCG/ACT inhaler Inhale 2 puffs into the lungs 2 (two) times daily. 05/02/20  Yes Brunetta Jeans, PA-C  clonazePAM (KLONOPIN) 0.5 MG tablet Take 1 tablet (0.5 mg total) by mouth 2 (two) times daily as needed. for anxiety 07/13/20  Yes Dutch Quint B, FNP  esomeprazole (NEXIUM) 40 MG capsule Take 1 capsule (40 mg total) by mouth 2 (two) times daily before a meal. 07/26/20  Yes Armbruster, Carlota Raspberry, MD  famotidine (PEPCID) 40 MG tablet Take 1 tablet (40 mg total) by mouth at bedtime. 04/06/20  Yes Brunetta Jeans, PA-C  hydrochlorothiazide (HYDRODIURIL) 25 MG tablet Take 1 tablet (25 mg total) by mouth daily. 04/06/20  Yes Brunetta Jeans, PA-C  levocetirizine (XYZAL) 5 MG tablet Take 1 tablet (5 mg total) by mouth every evening. 09/21/19  Yes Brunetta Jeans, PA-C  metFORMIN (GLUCOPHAGE) 500 MG tablet Take 500 mg by mouth daily. 11/20/19  Yes [provider]  mometasone (NASONEX) 50 MCG/ACT nasal spray Place into the nose. 03/29/20  Yes [provider]  NP THYROID 30 MG tablet Take 30 mg by mouth daily. 11/20/19  Yes [provider]  ondansetron (ZOFRAN) 4 MG tablet Take 1 tablet (4 mg total) by mouth every 8 (eight) hours as needed for nausea or vomiting. 06/16/20  Yes Colin Benton R, DO  polyethylene glycol (MIRALAX) 17 g packet Take 17 g by mouth daily. 07/26/20  Yes Armbruster, Carlota Raspberry, MD  potassium chloride SA (KLOR-CON) 20 MEQ tablet Take 1 tablet (20 mEq total) by mouth daily. 04/06/20  Yes Brunetta Jeans, PA-C  RABEprazole (ACIPHEX) 20 MG tablet Take 20 mg by mouth daily. 05/08/20  Yes [provider]  VITAMIN D PO Take by mouth.   Yes [provider]  Vitamin D, Ergocalciferol, (DRISDOL) 1.25 MG (50000 UNIT) CAPS capsule Take 1 capsule (50,000 Units total) by mouth every 7 (seven) days. 10/01/19  Yes Brunetta Jeans, PA-C   Social History   Socioeconomic History  . Marital status: Legally Separated    Spouse name: Not on file  . Number of children: 3  . Years of education: 43  . Highest education level: Not on file  Occupational History  . Occupation: Scientist, clinical (histocompatibility and immunogenetics)  Tobacco Use  . Smoking status: Former Smoker    Types: Cigarettes  . Smokeless tobacco: Never Used  . Tobacco comment: not smoking at all  Vaping Use  . Vaping Use: Never used  Substance and Sexual Activity  . Alcohol use: Yes    Alcohol/week: 0.0 standard drinks    Comment: Socially  . Drug use: No  . Sexual activity: Yes    Birth control/protection: Surgical    Comment: intercourse age 49, sexual partners more than 5  Other Topics Concern  . Not on file  Social History Narrative   Fun: Sleep    Social Determinants of Health   Financial Resource Strain: Not on file  Food Insecurity: Not on file  Transportation Needs: Not on file  Physical Activity: Not on file  Stress: Not on file  Social Connections: Not on file  Intimate Partner Violence: Not on file    Observations/Objective: nontoxic appearance on video. Speaking in full sentences. No respiratory distress.  All questions answered with  understanding of plan expressed.   Assessment and Plan: Cough  Allergic rhinitis, unspecified seasonality, unspecified trigger  Exacerbation of asthma, unspecified asthma severity, unspecified whether persistent  Suspected flare of allergies, asthma with recent pollen exposure versus viral URI.  Increased need for albuterol, dyspnea on exertion likely due to asthma.  Denies fever or discolored mucus.  Unlikely bacterial infection.  -  COVID testing negative 1 day after start of symptoms, recommended repeat today to rule out early/false negative.  If positive test, advised to be seen or call within the next 24 hours as approaching 5 days and window for treatments.  - prednisone 40mg  QD, potential side effects discussed. Prior dm/predm - monitor for hyperglycemeia sx's. Restart nasonex, continue qvar, xyzal. Er/urgent care precautions given.   Follow Up Instructions: Urgent care/ER precautions given.    I discussed the assessment and treatment plan with the patient. The patient was provided an opportunity to ask questions and all were answered. The patient agreed with the plan and demonstrated an understanding of the instructions.   The patient was advised to call back or seek an in-person evaluation if the symptoms worsen or if the condition fails to improve as anticipated.  I provided 22 minutes of non-face-to-face time during this encounter.   Wendie Agreste, MD

## 2020-09-18 ENCOUNTER — Encounter: Payer: Self-pay | Admitting: Family Medicine

## 2020-09-20 ENCOUNTER — Ambulatory Visit (INDEPENDENT_AMBULATORY_CARE_PROVIDER_SITE_OTHER): Payer: 59 | Admitting: Registered Nurse

## 2020-09-20 ENCOUNTER — Other Ambulatory Visit: Payer: Self-pay

## 2020-09-20 ENCOUNTER — Encounter: Payer: Self-pay | Admitting: Registered Nurse

## 2020-09-20 VITALS — BP 107/68 | HR 55 | Temp 98.0°F | Resp 18 | Ht 66.0 in | Wt 267.4 lb

## 2020-09-20 DIAGNOSIS — K6289 Other specified diseases of anus and rectum: Secondary | ICD-10-CM | POA: Diagnosis not present

## 2020-09-20 DIAGNOSIS — N3001 Acute cystitis with hematuria: Secondary | ICD-10-CM

## 2020-09-20 DIAGNOSIS — N390 Urinary tract infection, site not specified: Secondary | ICD-10-CM

## 2020-09-20 LAB — POCT URINALYSIS DIP (MANUAL ENTRY)
Bilirubin, UA: NEGATIVE
Glucose, UA: NEGATIVE mg/dL
Ketones, POC UA: NEGATIVE mg/dL
Nitrite, UA: NEGATIVE
Protein Ur, POC: 30 mg/dL — AB
Spec Grav, UA: >=1.03 — AB (ref 1.010–1.025)
Urobilinogen, UA: 0.2 E.U./dL
pH, UA: 5 (ref 5.0–8.0)

## 2020-09-20 MED ORDER — MUPIROCIN 2 % EX OINT
1.0000 | TOPICAL_OINTMENT | Freq: Two times a day (BID) | CUTANEOUS | 0 refills | Status: DC
Start: 2020-09-20 — End: 2023-09-20

## 2020-09-20 MED ORDER — SULFAMETHOXAZOLE-TRIMETHOPRIM 800-160 MG PO TABS: 1.0000 | ORAL_TABLET | Freq: Two times a day (BID) | ORAL | 0 refills | Status: DC

## 2020-09-20 NOTE — Patient Instructions (Signed)
° ° ° °  If you have lab work done today you will be contacted with your lab results within the next 2 weeks.  If you have not heard from us then please contact us. The fastest way to get your results is to register for My Chart. ° ° °IF you received an x-ray today, you will receive an invoice from [facility]. Please contact  Radiology at [phone] with questions or concerns regarding your invoice.  ° °IF you received labwork today, you will receive an invoice from [facility]. Please contact [facility] at [phone] with questions or concerns regarding your invoice.  ° °Our billing staff will not be able to assist you with questions regarding bills from these companies. ° °You will be contacted with the lab results as soon as they are available. The fastest way to get your results is to activate your My Chart account. Instructions are located on the last page of this paperwork. If you have not heard from us regarding the results in 2 weeks, please contact this office. °  ° ° ° °

## 2020-09-20 NOTE — Progress Notes (Signed)
Acute Office Visit  Subjective:    Patient ID: Tracy Valenzuela, female    DOB: 1976-02-11, 45 y.o.   MRN: [id number]  Chief Complaint  Patient presents with  . Urinary Tract Infection    Patient states she think she has a uti. Pressure while urination and also have a boil for about a week.    HPI Patient is in today for UTI and boi  uti Symptoms onset around 1 week ago Frequency, urgency, and dysuria Does note gross hematuria of bright red blood. Mild flank pain Denies systemic symptoms No recent abx use  Boil Near rectum Spontaneous rupture Some drainage No systemic symptoms Has happened before, no intervention previously  Otherwise no concerns.  Past Medical History:  Diagnosis Date  . Alcohol abuse   . Allergy    SEASONAL  . Anemia   . Anxiety   . Arthritis    BACK,KNESS-BILATERAL  . ASCUS with positive high risk HPV 11/2014  . Asthma   . Bipolar 1 disorder ([place])   . Constipation   . Depression   . Diabetes mellitus without complication ([place])   . Fibromyalgia   . GERD (gastroesophageal reflux disease)   . Heart murmur   . Hypertension   . LGSIL (low grade squamous intraepithelial dysplasia) 11/2014   colposcopy biopsy.  recommend follow up pap in one year  . Obesity   . Pre-diabetes   . Prediabetes   . Thyroid disease   . Vitamin D deficiency     Past Surgical History:  Procedure Laterality Date  . CARDIAC CATHETERIZATION    . CYSTOSCOPY  10/30/2016   Procedure: CYSTOSCOPY;  Surgeon: Anastasio Auerbach, MD;  Location: [facility];  Service: Gynecology;;  . LAPAROSCOPIC VAGINAL HYSTERECTOMY WITH SALPINGECTOMY Bilateral 10/30/2016   Procedure: LAPAROSCOPIC ASSISTED VAGINAL HYSTERECTOMY WITH SALPINGECTOMY;  Surgeon: Anastasio Auerbach, MD;  Location: [facility];  Service: Gynecology;  Laterality: Bilateral;  . TUBAL LIGATION      Family History  Problem Relation Age of Onset  . Lupus Mother   . Heart disease Mother   . High blood pressure Mother   . High  Cholesterol Mother   . Thyroid disease Mother   . Cancer Mother   . Depression Mother   . Anxiety disorder Mother   . Sleep apnea Mother   . Alcoholism Mother   . Obesity Mother   . Heart disease Father   . Diabetes Maternal Grandmother   . Heart disease Maternal Grandmother   . Hypertension Maternal Grandmother   . Prostate cancer Maternal Grandfather   . Alzheimer's disease Paternal Grandfather   . ADD / ADHD Son   . Colon cancer Neg Hx   . Stomach cancer Neg Hx   . Colon polyps Neg Hx   . Esophageal cancer Neg Hx   . Rectal cancer Neg Hx     Social History   Socioeconomic History  . Marital status: Legally Separated    Spouse name: Not on file  . Number of children: 3  . Years of education: 38  . Highest education level: Not on file  Occupational History  . Occupation: Scientist, clinical (histocompatibility and immunogenetics)  Tobacco Use  . Smoking status: Former Smoker    Types: Cigarettes  . Smokeless tobacco: Never Used  . Tobacco comment: not smoking at all  Vaping Use  . Vaping Use: Never used  Substance and Sexual Activity  . Alcohol use: Yes    Alcohol/week: 0.0 standard drinks    Comment: Socially  .  Drug use: No  . Sexual activity: Yes    Birth control/protection: Surgical    Comment: intercourse age 48, sexual partners more than 5  Other Topics Concern  . Not on file  Social History Narrative   Fun: Sleep    Social Determinants of Health   Financial Resource Strain: Not on file  Food Insecurity: Not on file  Transportation Needs: Not on file  Physical Activity: Not on file  Stress: Not on file  Social Connections: Not on file  Intimate Partner Violence: Not on file    Outpatient Medications Prior to Visit  Medication Sig Dispense Refill  . albuterol (VENTOLIN HFA) 108 (90 Base) MCG/ACT inhaler Inhale 2 puffs into the lungs every 6 (six) hours as needed for wheezing or shortness of breath. 8 g 0  . beclomethasone (QVAR REDIHALER) 40 MCG/ACT inhaler Inhale 2 puffs into the  lungs 2 (two) times daily. 1 each 1  . clonazePAM (KLONOPIN) 0.5 MG tablet Take 1 tablet (0.5 mg total) by mouth 2 (two) times daily as needed. for anxiety 60 tablet 0  . esomeprazole (NEXIUM) 40 MG capsule Take 1 capsule (40 mg total) by mouth 2 (two) times daily before a meal. 60 capsule 2  . famotidine (PEPCID) 40 MG tablet Take 1 tablet (40 mg total) by mouth at bedtime. 90 tablet 1  . hydrochlorothiazide (HYDRODIURIL) 25 MG tablet Take 1 tablet (25 mg total) by mouth daily. 90 tablet 1  . levocetirizine (XYZAL) 5 MG tablet Take 1 tablet (5 mg total) by mouth every evening. 30 tablet 1  . metFORMIN (GLUCOPHAGE) 500 MG tablet Take 500 mg by mouth daily.    . mometasone (NASONEX) 50 MCG/ACT nasal spray Place into the nose.    . NP THYROID 30 MG tablet Take 30 mg by mouth daily.    . ondansetron (ZOFRAN) 4 MG tablet Take 1 tablet (4 mg total) by mouth every 8 (eight) hours as needed for nausea or vomiting. 20 tablet 0  . polyethylene glycol (MIRALAX) 17 g packet Take 17 g by mouth daily. 30 each 2  . potassium chloride SA (KLOR-CON) 20 MEQ tablet Take 1 tablet (20 mEq total) by mouth daily. 90 tablet 1  . VITAMIN D PO Take by mouth.    . predniSONE (DELTASONE) 20 MG tablet Take 2 tablets (40 mg total) by mouth daily with breakfast. (Patient not taking: Reported on 09/20/2020) 10 tablet 0  . Vitamin D, Ergocalciferol, (DRISDOL) 1.25 MG (50000 UNIT) CAPS capsule Take 1 capsule (50,000 Units total) by mouth every 7 (seven) days. (Patient not taking: Reported on 09/20/2020) 4 capsule 2   No facility-administered medications prior to visit.    Allergies  Allergen Reactions  . Penicillins Hives    Has patient had a PCN reaction causing immediate rash, facial/tongue/throat swelling, SOB or lightheadedness with hypotension:no Has patient had a PCN reaction causing severe rash involving mucus membranes or skin necrosis: Yes Has patient had a PCN reaction that required hospitalization No Has patient  had a PCN reaction occurring within the last 10 years: No If all of the above answers are "NO", then may proceed with Cephalosporin use.     Review of Systems Per hpi      Objective:    Physical Exam Vitals and nursing note reviewed.  Constitutional:      General: She is not in acute distress.    Appearance: Normal appearance. She is not ill-appearing, toxic-appearing or diaphoretic.  Cardiovascular:  Rate and Rhythm: Normal rate and regular rhythm.     Pulses: Normal pulses.     Heart sounds: Normal heart sounds. No murmur heard. No friction rub. No gallop.   Pulmonary:     Effort: Pulmonary effort is normal. No respiratory distress.     Breath sounds: Normal breath sounds. No stridor. No wheezing, rhonchi or rales.  Chest:     Chest wall: No tenderness.  Genitourinary:    Comments: Deferred by pt Skin:    General: Skin is warm and dry.     Capillary Refill: Capillary refill takes less than 2 seconds.  Neurological:     General: No focal deficit present.     Mental Status: She is alert and oriented to person, place, and time. Mental status is at baseline.  Psychiatric:        Mood and Affect: Mood normal.        Behavior: Behavior normal.        Thought Content: Thought content normal.        Judgment: Judgment normal.     BP 107/68   Pulse (!) 55   Temp 98 F (36.7 C) (Temporal)   Resp 18   Ht 5\' 6"  (1.676 m)   Wt 267 lb 6.4 oz (121.3 kg)   LMP  (LMP Unknown)   SpO2 99%   BMI 43.16 kg/m  Wt Readings from Last 3 Encounters:  09/20/20 267 lb 6.4 oz (121.3 kg)  08/02/20 273 lb (123.8 kg)  07/26/20 273 lb (123.8 kg)    There are no preventive care reminders to display for this patient.  There are no preventive care reminders to display for this patient.   Lab Results  Component Value Date   TSH 1.790 08/19/2019   Lab Results  Component Value Date   WBC 4.5 04/18/2020   HGB 11.8 04/18/2020   HCT 35.2 04/18/2020   MCV 86.5 04/18/2020   PLT 303  04/18/2020   Lab Results  Component Value Date   NA 136 09/29/2019   K 4.0 09/29/2019   CO2 28 09/29/2019   GLUCOSE 123 (H) 09/29/2019   BUN 12 09/29/2019   CREATININE 1.14 09/29/2019   BILITOT 0.3 07/26/2020   ALKPHOS 53 07/26/2020   AST 12 07/26/2020   ALT 12 07/26/2020   PROT 7.8 07/26/2020   ALBUMIN 4.1 07/26/2020   CALCIUM 8.9 09/29/2019   ANIONGAP 13 11/18/2017   GFR 63.14 09/29/2019   Lab Results  Component Value Date   CHOL 228 (H) 08/19/2019   Lab Results  Component Value Date   HDL 49 08/19/2019   Lab Results  Component Value Date   LDLCALC 153 (H) 08/19/2019   Lab Results  Component Value Date   TRIG 145 08/19/2019   Lab Results  Component Value Date   CHOLHDL 5.4 10/12/2008   Lab Results  Component Value Date   HGBA1C 6.0 (H) 08/19/2019       Assessment & Plan:   Problem List Items Addressed This Visit   None   Visit Diagnoses    Urinary tract infection without hematuria, site unspecified    -  Primary   Relevant Medications   sulfamethoxazole-trimethoprim (BACTRIM DS) 800-160 MG tablet   mupirocin ointment (BACTROBAN) 2 %   Other Relevant Orders   POCT urinalysis dipstick (Completed)   Acute cystitis with hematuria       Relevant Medications   sulfamethoxazole-trimethoprim (BACTRIM DS) 800-160 MG tablet   Perirectal cyst  Relevant Medications   sulfamethoxazole-trimethoprim (BACTRIM DS) 800-160 MG tablet   mupirocin ointment (BACTROBAN) 2 %       No orders of the defined types were placed in this encounter.  PLAN  POCT UA suggestive of UTI.   Will give extended course of bactrim to seek coverage for perirectal abscess as well  Topical mupirocin for abscess  Consider gen surg referral if no improvement in coming days  ER precautions reviewed with patient which include namely systemic symptoms of infection  Patient encouraged to call clinic with any questions, comments, or concerns.   Maximiano Coss, NP

## 2020-10-04 ENCOUNTER — Encounter: Payer: Self-pay | Admitting: Registered Nurse

## 2020-10-05 ENCOUNTER — Other Ambulatory Visit: Payer: Self-pay | Admitting: Registered Nurse

## 2020-10-05 DIAGNOSIS — R310 Gross hematuria: Secondary | ICD-10-CM

## 2020-11-08 ENCOUNTER — Encounter: Payer: Self-pay | Admitting: Family Medicine

## 2020-11-08 ENCOUNTER — Telehealth (INDEPENDENT_AMBULATORY_CARE_PROVIDER_SITE_OTHER): Payer: 59 | Admitting: Family Medicine

## 2020-11-08 DIAGNOSIS — Z20822 Contact with and (suspected) exposure to covid-19: Secondary | ICD-10-CM | POA: Diagnosis not present

## 2020-11-08 MED ORDER — QVAR REDIHALER 40 MCG/ACT IN AERB: 2.0000 | INHALATION_SPRAY | Freq: Two times a day (BID) | RESPIRATORY_TRACT | 1 refills | Status: DC

## 2020-11-08 MED ORDER — GUAIFENESIN-CODEINE 100-10 MG/5ML PO SYRP: 10.0000 mL | ORAL_SOLUTION | Freq: Three times a day (TID) | ORAL | 0 refills | Status: DC | PRN

## 2020-11-08 NOTE — Progress Notes (Signed)
Virtual Visit via Video   I connected with patient on 11/08/20 at 11:30 AM EDT by a video enabled telemedicine application and verified that I am speaking with the correct person using two identifiers.  Location patient: Home Location provider: [facility], Office Persons participating in the virtual visit: Patient, Provider, [place] (Sabrina M)  I discussed the limitations of evaluation and management by telemedicine and the availability of in person appointments. The patient expressed understanding and agreed to proceed.  Subjective:   HPI:   URI- sxs started Friday.  + nasal congestion, facial pressure, HAs.  Denies body aches.  No chills.  + sweats.  + sick contacts- has COVID test scheduled for tomorrow.  Was tested on Thursday but that was prior to sxs.  No home test available.  Pt report this feels different than previous episode of COVID.  + SOB- yesterday had trouble speaking in complete sentences.  Pt needs refill on Qvar and she has albuterol rescue inhaler.  + cough, productive of yellow sputum.  Sxs started suddenly on Friday.  ROS:   See pertinent positives and negatives per HPI.  Patient Active Problem List   Diagnosis Date Noted   Positive ANA (antinuclear antibody) 04/18/2020   Essential hypertension 10/06/2019   Elevated cholesterol 08/20/2019   COVID-19 virus infection 04/03/2019   Obstructive sleep apnea of adult 05/15/2018   Obesity (BMI 35.0-39.9 without comorbidity) 05/15/2018   Hypothyroidism 05/15/2018   Fibromyalgia 09/27/2017   Primary osteoarthritis of both knees 09/27/2017   Vitamin D deficiency 09/27/2017   Leiomyoma 10/30/2016   Prediabetes 08/21/2016   Gastroesophageal reflux disease 08/21/2016   Thornwaldt's cyst 05/29/2016   Morbid obesity ([place]) 11/09/2015   Decreased attention Span 09/30/2015   Iron deficiency anemia 10/19/2014    Social History   Tobacco Use   Smoking status: Former    Pack years: 0.00    Types: Cigarettes    Smokeless tobacco: Never   Tobacco comments:    not smoking at all  Substance Use Topics   Alcohol use: Yes    Alcohol/week: 0.0 standard drinks    Comment: Socially    Current Outpatient Medications:    albuterol (VENTOLIN HFA) 108 (90 Base) MCG/ACT inhaler, Inhale 2 puffs into the lungs every 6 (six) hours as needed for wheezing or shortness of breath., Disp: 8 g, Rfl: 0   beclomethasone (QVAR REDIHALER) 40 MCG/ACT inhaler, Inhale 2 puffs into the lungs 2 (two) times daily., Disp: 1 each, Rfl: 1   clonazePAM (KLONOPIN) 0.5 MG tablet, Take 1 tablet (0.5 mg total) by mouth 2 (two) times daily as needed. for anxiety, Disp: 60 tablet, Rfl: 0   esomeprazole (NEXIUM) 40 MG capsule, Take 1 capsule (40 mg total) by mouth 2 (two) times daily before a meal., Disp: 60 capsule, Rfl: 2   famotidine (PEPCID) 40 MG tablet, Take 1 tablet (40 mg total) by mouth at bedtime., Disp: 90 tablet, Rfl: 1   hydrochlorothiazide (HYDRODIURIL) 25 MG tablet, Take 1 tablet (25 mg total) by mouth daily., Disp: 90 tablet, Rfl: 1   levocetirizine (XYZAL) 5 MG tablet, Take 1 tablet (5 mg total) by mouth every evening., Disp: 30 tablet, Rfl: 1   metFORMIN (GLUCOPHAGE) 500 MG tablet, Take 500 mg by mouth daily., Disp: , Rfl:    mometasone (NASONEX) 50 MCG/ACT nasal spray, Place into the nose., Disp: , Rfl:    NP THYROID 30 MG tablet, Take 30 mg by mouth daily., Disp: , Rfl:    polyethylene  glycol (MIRALAX) 17 g packet, Take 17 g by mouth daily., Disp: 30 each, Rfl: 2   potassium chloride SA (KLOR-CON) 20 MEQ tablet, Take 1 tablet (20 mEq total) by mouth daily., Disp: 90 tablet, Rfl: 1   VITAMIN D PO, Take by mouth., Disp: , Rfl:    mupirocin ointment (BACTROBAN) 2 %, Apply 1 application topically 2 (two) times daily. (Patient not taking: Reported on 11/08/2020), Disp: 22 g, Rfl: 0   ondansetron (ZOFRAN) 4 MG tablet, Take 1 tablet (4 mg total) by mouth every 8 (eight) hours as needed for nausea or vomiting. (Patient not taking:  Reported on 11/08/2020), Disp: 20 tablet, Rfl: 0   predniSONE (DELTASONE) 20 MG tablet, Take 2 tablets (40 mg total) by mouth daily with breakfast. (Patient not taking: No sig reported), Disp: 10 tablet, Rfl: 0   sulfamethoxazole-trimethoprim (BACTRIM DS) 800-160 MG tablet, Take 1 tablet by mouth 2 (two) times daily. (Patient not taking: Reported on 11/08/2020), Disp: 14 tablet, Rfl: 0   Vitamin D, Ergocalciferol, (DRISDOL) 1.25 MG (50000 UNIT) CAPS capsule, Take 1 capsule (50,000 Units total) by mouth every 7 (seven) days. (Patient not taking: Reported on 09/20/2020), Disp: 4 capsule, Rfl: 2  Allergies  Allergen Reactions   Penicillins Hives    Has patient had a PCN reaction causing immediate rash, facial/tongue/throat swelling, SOB or lightheadedness with hypotension:no Has patient had a PCN reaction causing severe rash involving mucus membranes or skin necrosis: Yes Has patient had a PCN reaction that required hospitalization No Has patient had a PCN reaction occurring within the last 10 years: No If all of the above answers are "NO", then may proceed with Cephalosporin use.     Objective:   LMP  (LMP Unknown)  AAOx3, NAD NCAT, EOMI No obvious CN deficits Coloring WNL Pt is able to speak clearly, coherently without shortness of breath or increased work of breathing.  Thought process is linear.  Mood is appropriate.   Assessment and Plan:   Suspected COVID- new.  Pt's sxs started on Friday so explained to her the need to retest today so we can start antivirals if needed.  Given her sxs and known sick contacts, i'm highly suspicious of COVID infection.  She does not have a recent GFR so Paxlovid is not an option.  We could prescribe Molnupiravir if she is in fact positive.  Refill on Qvar.  Prescription cough syrup sent.  Encouraged fluids, rest, Vit D, Vit C, and Zinc.  Note provided for work.  Pt enrolled in home monitoring.   Annye Asa, MD 11/08/2020

## 2020-11-08 NOTE — Progress Notes (Signed)
I connected with  Tracy Valenzuela on 11/08/20 by a video enabled telemedicine application and verified that I am speaking with the correct person using two identifiers.   I discussed the limitations of evaluation and management by telemedicine. The patient expressed understanding and agreed to proceed.

## 2020-11-09 ENCOUNTER — Encounter: Payer: Self-pay | Admitting: Family Medicine

## 2020-11-23 ENCOUNTER — Encounter: Payer: 59 | Admitting: Family Medicine

## 2020-11-23 ENCOUNTER — Encounter: Payer: 59 | Admitting: Registered Nurse

## 2020-12-16 ENCOUNTER — Encounter: Payer: Self-pay | Admitting: Pulmonary Disease

## 2020-12-16 ENCOUNTER — Ambulatory Visit (INDEPENDENT_AMBULATORY_CARE_PROVIDER_SITE_OTHER): Payer: 59

## 2020-12-16 ENCOUNTER — Ambulatory Visit (INDEPENDENT_AMBULATORY_CARE_PROVIDER_SITE_OTHER): Payer: 59 | Admitting: Pulmonary Disease

## 2020-12-16 ENCOUNTER — Other Ambulatory Visit: Payer: Self-pay

## 2020-12-16 VITALS — BP 122/76 | HR 49 | Temp 98.2°F | Ht 67.0 in | Wt 274.6 lb

## 2020-12-16 DIAGNOSIS — R0683 Snoring: Secondary | ICD-10-CM | POA: Diagnosis not present

## 2020-12-16 DIAGNOSIS — R053 Chronic cough: Secondary | ICD-10-CM | POA: Diagnosis not present

## 2020-12-16 LAB — COMPREHENSIVE METABOLIC PANEL
ALT: 11 U/L (ref 0–35)
AST: 11 U/L (ref 0–37)
Albumin: 3.8 g/dL (ref 3.5–5.2)
Alkaline Phosphatase: 43 U/L (ref 39–117)
BUN: 12 mg/dL (ref 6–23)
CO2: 27 mEq/L (ref 19–32)
Calcium: 9 mg/dL (ref 8.4–10.5)
Chloride: 104 mEq/L (ref 96–112)
Creatinine, Ser: 0.79 mg/dL (ref 0.40–1.20)
GFR: 91.53 mL/min (ref >60.00)
Glucose, Bld: 91 mg/dL (ref 70–99)
Potassium: 4 mEq/L (ref 3.5–5.1)
Sodium: 138 mEq/L (ref 135–145)
Total Bilirubin: 0.3 mg/dL (ref 0.2–1.2)
Total Protein: 7.2 g/dL (ref 6.0–8.3)

## 2020-12-16 LAB — CBC WITH DIFFERENTIAL/PLATELET
Basophils Absolute: 0 10*3/uL (ref 0.0–0.1)
Basophils Relative: 0.7 % (ref 0.0–3.0)
Eosinophils Absolute: 0.2 10*3/uL (ref 0.0–0.7)
Eosinophils Relative: 6 % — ABNORMAL HIGH (ref 0.0–5.0)
HCT: 31.7 % — ABNORMAL LOW (ref 36.0–46.0)
Hemoglobin: 10.5 g/dL — ABNORMAL LOW (ref 12.0–15.0)
Lymphocytes Relative: 39.4 % (ref 12.0–46.0)
Lymphs Abs: 1.6 10*3/uL (ref 0.7–4.0)
MCHC: 33.2 g/dL (ref 30.0–36.0)
MCV: 87.1 fl (ref 78.0–100.0)
Monocytes Absolute: 0.4 10*3/uL (ref 0.1–1.0)
Monocytes Relative: 10.2 % (ref 3.0–12.0)
Neutro Abs: 1.8 10*3/uL (ref 1.4–7.7)
Neutrophils Relative %: 43.7 % (ref 43.0–77.0)
Platelets: 296 10*3/uL (ref 150.0–400.0)
RBC: 3.63 Mil/uL — ABNORMAL LOW (ref 3.87–5.11)
RDW: 13.5 % (ref 11.5–15.5)
WBC: 4.1 10*3/uL (ref 4.0–10.5)

## 2020-12-16 MED ORDER — MONTELUKAST SODIUM 10 MG PO TABS: 10.0000 mg | ORAL_TABLET | Freq: Every day | ORAL | 5 refills | Status: DC

## 2020-12-16 MED ORDER — BUDESONIDE-FORMOTEROL FUMARATE 160-4.5 MCG/ACT IN AERO: 2.0000 | INHALATION_SPRAY | Freq: Two times a day (BID) | RESPIRATORY_TRACT | 6 refills | Status: DC

## 2020-12-16 NOTE — Patient Instructions (Signed)
Chest xray and lab work today. Will arrange for pulmonary function test, FeNO, and home sleep study. Symbicort two puffs in the morning and two puffs in the evening, and rinse your mouth after each use. Singulair 10 mg pill nightly. Stop using Qvar. Continue albuterol two puffs every 6 hours as needed for cough, wheeze, or chest congestion.  Follow up in 4 weeks with Dr. Halford Chessman or Nurse Practitioner

## 2020-12-16 NOTE — Progress Notes (Signed)
[facility]  Chief Complaint  Patient presents with   Consult    Waking at night sometimes unable to fall back asleep.    Constitutional:  BP 122/76   Pulse (!) 49   Temp 98.2 F (36.8 C) (Oral)   Ht '5\' 7"'$  (1.702 m)   Wt 274 lb 9.6 oz (124.6 kg)   LMP  (LMP Unknown)   SpO2 98%   BMI 43.01 kg/m   Past Medical History:  ETOH, Anemia, Anxiety, HPV, Bipolar, Constipation, Depression, Pre-diabetes, Fibromyaglia, GERD, HTN, Hypothyroidism, Vit D deficiency, COVID 19 infection December 2020  Past Surgical History:  She  has a past surgical history that includes Tubal ligation; Cardiac catheterization; Laparoscopic vaginal hysterectomy with salpingectomy (Bilateral, 10/30/2016); and Cystoscopy (10/30/2016).  Brief Summary:  Tracy Valenzuela is a 45 y.o. female former smoker with snoring and dyspnea.      Subjective:   She has trouble falling asleep and staying asleep.  She was previously on klonopin, but this was stopped several months ago.  She takes melatonin, but this doesn't help much.  She takes about an hour to fall asleep now.  She snores and has been told she stops breathing while asleep.  She will talk in her sleep and wakes up feeling her teeth clenched.  She feels tired during the day.  She goes to bed at 11 pm and wakes up at 730 am.  She feels tired in the morning.  She wakes up sometimes to use the bathroom.  She isn't using anything to help stay awake.  She denies sleep walking, or nightmares.  There is no history of restless legs.  She denies sleep hallucinations, sleep paralysis, or cataplexy.  The Epworth score is 0 out of 24.  She has history of asthma.  She quit smoking several years ago.  She had COVID 19 infection in December 2020.  Her asthma symptoms have gotten worse since then.  She has more cough with clear sputum.  She feels tight in her chest and has to stop walking to catch her breath.  She wakes up with a cough  and wheeze.  She has been using Qvar and albuterol with partial relief.  Her mother has asthma.    Physical Exam:   Appearance - well kempt   ENMT - no sinus tenderness, no oral exudate, no LAN, Mallampati 3 airway, no stridor, 2+ tonsils  Respiratory - faint bilateral expiratory wheezing  CV - s1s2 regular rate and rhythm, no murmurs  Ext - no clubbing, no edema  Skin - no rashes  Psych - normal mood and affect   Pulmonary testing:    Chest Imaging:    Sleep Tests:    Cardiac Tests:  Echo 04/28/17 >> EF 55 to 60%, mild LVH, mod LA dilation, mild MR  Social History:  She  reports that she quit smoking about 4 years ago. Her smoking use included cigarettes. She started smoking about 24 years ago. She has never used smokeless tobacco. She reports current alcohol use. She reports that she does not use drugs.  Family History:  Her family history includes ADD / ADHD in her son; Alcoholism in her mother; Alzheimer's disease in her paternal grandfather; Anxiety disorder in her mother; Cancer in her mother; Depression in her mother; Diabetes in her maternal grandmother; Heart disease in her father, maternal grandmother, and mother; High Cholesterol in her mother; High blood pressure in her mother; Hypertension in her maternal grandmother; Lupus  in her mother; Obesity in her mother; Prostate cancer in her maternal grandfather; Sleep apnea in her mother; Thyroid disease in her mother.    Discussion:  She has snoring, sleep disruption, apnea, and daytime sleepiness.  She has history of mood disorder and hypertension.  Her BMI is > 35.  I am concerned she could have obstructive sleep apnea.  She has history of asthma and prior history of smoking.  She had COVID 19 infection in December 2020 and reports asthma symptoms have been persistently worse since then.  Assessment/Plan:   Dyspnea on exertion with cough and wheeze. - with history of asthma, tobacco abuse, and prior COVID 19  infection - change from Qvar to symbicort 160 two puffs bid - add singulair 10 mg qhs - prn albuterol - will arrange for chest xray, PFT, FeNO, CBC with diff, IgE, CMET  Snoring with excessive daytime sleepiness. - will need to arrange for a home sleep study  Obesity. - discussed how weight can impact sleep and risk for sleep disordered breathing - discussed options to assist with weight loss: combination of diet modification, cardiovascular and strength training exercises  Cardiovascular risk. - had an extensive discussion regarding the adverse health consequences related to untreated sleep disordered breathing - specifically discussed the risks for hypertension, coronary artery disease, cardiac dysrhythmias, cerebrovascular disease, and diabetes - lifestyle modification discussed  Safe driving practices. - discussed how sleep disruption can increase risk of accidents, particularly when driving - safe driving practices were discussed  Therapies for obstructive sleep apnea. - if the sleep study shows significant sleep apnea, then various therapies for treatment were reviewed: CPAP, oral appliance, and surgical interventions  Time Spent Involved in Patient Care on Day of Examination:  47 minutes  Follow up:   Patient Instructions  Chest xray and lab work today. Will arrange for pulmonary function test, FeNO, and home sleep study. Symbicort two puffs in the morning and two puffs in the evening, and rinse your mouth after each use. Singulair 10 mg pill nightly. Stop using Qvar. Continue albuterol two puffs every 6 hours as needed for cough, wheeze, or chest congestion.  Follow up in 4 weeks with Dr. Halford Chessman or Nurse Practitioner  Medication List:   Allergies as of 12/16/2020       Reactions   Penicillins Hives   Has patient had a PCN reaction causing immediate rash, facial/tongue/throat swelling, SOB or lightheadedness with hypotension:no Has patient had a PCN reaction causing  severe rash involving mucus membranes or skin necrosis: Yes Has patient had a PCN reaction that required hospitalization No Has patient had a PCN reaction occurring within the last 10 years: No If all of the above answers are "NO", then may proceed with Cephalosporin use.        Medication List        Accurate as of December 16, 2020 10:45 AM. If you have any questions, ask your nurse or doctor.          STOP taking these medications    clonazePAM 0.5 MG tablet Commonly known as: KLONOPIN Stopped by: Chesley Mires, MD   metFORMIN 500 MG tablet Commonly known as: GLUCOPHAGE Stopped by: Chesley Mires, MD   Qvar RediHaler 40 MCG/ACT inhaler Generic drug: beclomethasone Stopped by: Chesley Mires, MD       TAKE these medications    albuterol 108 (90 Base) MCG/ACT inhaler Commonly known as: VENTOLIN HFA Inhale 2 puffs into the lungs every 6 (six) hours as needed for  wheezing or shortness of breath.   budesonide-formoterol 160-4.5 MCG/ACT inhaler Commonly known as: Symbicort Inhale 2 puffs into the lungs 2 (two) times daily. Started by: Chesley Mires, MD   esomeprazole 40 MG capsule Commonly known as: NEXIUM Take 1 capsule (40 mg total) by mouth 2 (two) times daily before a meal.   famotidine 40 MG tablet Commonly known as: PEPCID Take 1 tablet (40 mg total) by mouth at bedtime.   guaiFENesin-codeine 100-10 MG/5ML syrup Commonly known as: ROBITUSSIN AC Take 10 mLs by mouth 3 (three) times daily as needed for cough.   hydrochlorothiazide 25 MG tablet Commonly known as: HYDRODIURIL Take 1 tablet (25 mg total) by mouth daily.   levocetirizine 5 MG tablet Commonly known as: Xyzal Take 1 tablet (5 mg total) by mouth every evening.   mometasone 50 MCG/ACT nasal spray Commonly known as: NASONEX Place into the nose.   montelukast 10 MG tablet Commonly known as: SINGULAIR Take 1 tablet (10 mg total) by mouth at bedtime. Started by: Chesley Mires, MD   mupirocin ointment  2 % Commonly known as: BACTROBAN Apply 1 application topically 2 (two) times daily.   NP Thyroid 30 MG tablet Generic drug: thyroid Take 30 mg by mouth daily.   polyethylene glycol 17 g packet Commonly known as: MiraLax Take 17 g by mouth daily.   potassium chloride SA 20 MEQ tablet Commonly known as: KLOR-CON Take 1 tablet (20 mEq total) by mouth daily.   VITAMIN D PO Take by mouth.        Signature:  Chesley Mires, MD [place] Pager - [phone] 12/16/2020, 10:45 AM

## 2020-12-19 ENCOUNTER — Telehealth: Payer: Self-pay | Admitting: Registered Nurse

## 2020-12-19 LAB — IGE: IgE (Immunoglobulin E), Serum: 33 kU/L (ref <=114)

## 2020-12-19 NOTE — Telephone Encounter (Signed)
Received the following message from patient:   "Great Monday to you. I looked over my labs and the set with my HCT, hemoglobin and the other lab has ran low, but it getting lower. I was given a hysterectomy in 2017 do to my count begin low, but it still decreasing. I am concerned because I was test for lupus because of family history and the MD says no. I am just concerned because my hemoglobin has never been at 10.5, holds in the 11 and 12."   VS, can you please advise on her lab results? Thanks!

## 2020-12-19 NOTE — Telephone Encounter (Signed)
Patient called and states that she has never received her results from her HIV test.  Please advise.

## 2021-01-13 ENCOUNTER — Ambulatory Visit: Payer: 59 | Admitting: Adult Health

## 2021-02-17 ENCOUNTER — Ambulatory Visit: Payer: 59

## 2021-02-17 ENCOUNTER — Other Ambulatory Visit: Payer: Self-pay

## 2021-02-17 DIAGNOSIS — R0683 Snoring: Secondary | ICD-10-CM

## 2021-02-17 DIAGNOSIS — G4733 Obstructive sleep apnea (adult) (pediatric): Secondary | ICD-10-CM

## 2021-02-19 NOTE — Telephone Encounter (Signed)
Looks like future orders are in place. Can have this drawn at upcoming visit  Thank you  Rich

## 2021-02-23 ENCOUNTER — Telehealth: Payer: Self-pay | Admitting: Pulmonary Disease

## 2021-02-23 DIAGNOSIS — G4733 Obstructive sleep apnea (adult) (pediatric): Secondary | ICD-10-CM

## 2021-02-23 NOTE — Telephone Encounter (Signed)
HST 02/20/21 >> AHI 11.4, SpO2 low 75%  Please inform her that her sleep study shows mild obstructive sleep apnea.  Please arrange for ROV with me or NP to discuss treatment options.

## 2021-02-23 NOTE — Telephone Encounter (Signed)
ATC patient. LMTCB with RDS office number  

## 2021-03-06 ENCOUNTER — Encounter: Payer: 59 | Admitting: Registered Nurse

## 2021-10-17 ENCOUNTER — Encounter (HOSPITAL_BASED_OUTPATIENT_CLINIC_OR_DEPARTMENT_OTHER): Payer: Self-pay

## 2021-10-17 ENCOUNTER — Emergency Department (HOSPITAL_BASED_OUTPATIENT_CLINIC_OR_DEPARTMENT_OTHER)
Admission: EM | Admit: 2021-10-17 | Discharge: 2021-10-17 | Disposition: A | Discharge status: Home / Self Care | Payer: Medicaid Other | Attending: Emergency Medicine | Admitting: Emergency Medicine

## 2021-10-17 ENCOUNTER — Other Ambulatory Visit: Payer: Self-pay

## 2021-10-17 DIAGNOSIS — I10 Essential (primary) hypertension: Secondary | ICD-10-CM | POA: Insufficient documentation

## 2021-10-17 DIAGNOSIS — Z79899 Other long term (current) drug therapy: Secondary | ICD-10-CM | POA: Insufficient documentation

## 2021-10-17 DIAGNOSIS — R001 Bradycardia, unspecified: Secondary | ICD-10-CM | POA: Insufficient documentation

## 2021-10-17 DIAGNOSIS — J069 Acute upper respiratory infection, unspecified: Secondary | ICD-10-CM | POA: Insufficient documentation

## 2021-10-17 DIAGNOSIS — E039 Hypothyroidism, unspecified: Secondary | ICD-10-CM | POA: Insufficient documentation

## 2021-10-17 DIAGNOSIS — J45909 Unspecified asthma, uncomplicated: Secondary | ICD-10-CM | POA: Insufficient documentation

## 2021-10-17 LAB — GROUP A STREP BY PCR: Group A Strep by PCR: NOT DETECTED

## 2021-10-17 NOTE — ED Provider Notes (Signed)
[facility] Provider Note   CSN: [id number] Arrival date & time: 10/17/21  1000     History  Chief Complaint  Patient presents with   Sore Throat   Nasal Congestion    Tracy Valenzuela is a 46 y.o. female.  Patient is a 46 year old female with a history of asthma, bipolar disease, anemia, hypertension and hypothyroidism who is presenting today with URI symptoms over the last 3 days.  She is having nasal congestion, cough and sore throat.  She reports the sore throat has gradually worsened and she feels pain in her anterior neck.  No difficulty swallowing or trouble breathing.  She felt fever and chills on Sunday but has not had any further fever.  She does have a history of having recurrent strep throat and last treated for strep in January of this year.  No known sick contacts.  The history is provided by the patient.  Sore Throat       Home Medications Prior to Admission medications   Medication Sig Start Date End Date Taking? Authorizing Provider  albuterol (VENTOLIN HFA) 108 (90 Base) MCG/ACT inhaler Inhale 2 puffs into the lungs every 6 (six) hours as needed for wheezing or shortness of breath. 05/02/20   Brunetta Jeans, PA-C  budesonide-formoterol [facility]) 160-4.5 MCG/ACT inhaler Inhale 2 puffs into the lungs 2 (two) times daily. 12/16/20   Chesley Mires, MD  esomeprazole (NEXIUM) 40 MG capsule Take 1 capsule (40 mg total) by mouth 2 (two) times daily before a meal. 09/02/20   Armbruster, Carlota Raspberry, MD  famotidine (PEPCID) 40 MG tablet Take 1 tablet (40 mg total) by mouth at bedtime. 04/06/20   Brunetta Jeans, PA-C  guaiFENesin-codeine (ROBITUSSIN AC) 100-10 MG/5ML syrup Take 10 mLs by mouth 3 (three) times daily as needed for cough. 11/08/20   Midge Minium, MD  hydrochlorothiazide (HYDRODIURIL) 25 MG tablet Take 1 tablet (25 mg total) by mouth daily. 04/06/20   Brunetta Jeans, PA-C  levocetirizine (XYZAL) 5 MG tablet Take 1 tablet (5 mg  total) by mouth every evening. 09/21/19   Brunetta Jeans, PA-C  mometasone (NASONEX) 50 MCG/ACT nasal spray Place into the nose. 03/29/20   [provider]  montelukast (SINGULAIR) 10 MG tablet Take 1 tablet (10 mg total) by mouth at bedtime. 12/16/20   Chesley Mires, MD  mupirocin ointment (BACTROBAN) 2 % Apply 1 application topically 2 (two) times daily. 09/20/20   Maximiano Coss, NP  NP THYROID 30 MG tablet Take 30 mg by mouth daily. 11/20/19   [provider]  polyethylene glycol (MIRALAX) 17 g packet Take 17 g by mouth daily. 09/02/20   Armbruster, Carlota Raspberry, MD  potassium chloride SA (KLOR-CON) 20 MEQ tablet Take 1 tablet (20 mEq total) by mouth daily. 04/06/20   Brunetta Jeans, PA-C  VITAMIN D PO Take by mouth.    [provider]      Allergies    Penicillins    Review of Systems   Review of Systems  Physical Exam Updated Vital Signs BP (!) 137/47 (BP Location: Right Arm)   Pulse (!) 55   Temp 98.4 F (36.9 C) (Oral)   Resp 17   Ht '5\' 8"'$  (1.727 m)   Wt 122.5 kg   LMP  (LMP Unknown)   SpO2 99%   BMI 41.05 kg/m  Physical Exam Vitals and nursing note reviewed.  Constitutional:      General: She is not in acute distress.  Appearance: She is well-developed.  HENT:     Head: Normocephalic and atraumatic.     Right Ear: Tympanic membrane normal.     Left Ear: Tympanic membrane normal.     Nose: Congestion present.     Mouth/Throat:     Mouth: Mucous membranes are moist. No oral lesions.     Pharynx: Posterior oropharyngeal erythema present. No pharyngeal swelling, oropharyngeal exudate or uvula swelling.     Tonsils: No tonsillar exudate.  Eyes:     Pupils: Pupils are equal, round, and reactive to light.  Cardiovascular:     Rate and Rhythm: Bradycardia present.  Pulmonary:     Effort: Pulmonary effort is normal.     Breath sounds: Normal breath sounds. No wheezing or rales.  Musculoskeletal:        General: No tenderness. Normal range of  motion.     Comments: No edema  Lymphadenopathy:     Cervical: Cervical adenopathy present.  Skin:    General: Skin is warm and dry.     Findings: No rash.  Neurological:     Mental Status: She is alert and oriented to person, place, and time.     Cranial Nerves: No cranial nerve deficit.  Psychiatric:        Behavior: Behavior normal.     ED Results / Procedures / Treatments   Labs (all labs ordered are listed, but only abnormal results are displayed) Labs Reviewed  GROUP A STREP BY PCR    EKG None  Radiology No results found.  Procedures Procedures    Medications Ordered in ED Medications - No data to display  ED Course/ Medical Decision Making/ A&P                           Medical Decision Making  Pt with symptoms consistent with viral URI vs pharnygitis.  Well appearing here.  No signs of breathing difficulty  No signs of otitis or abnormal resp findings.  No stridor or trismus.  Low suspicion for epiglottitis, RPA or PTA. strep wnl and pt to return with any further problems.         Final Clinical Impression(s) / ED Diagnoses Final diagnoses:  Viral upper respiratory tract infection    Rx / DC Orders ED Discharge Orders     None         Blanchie Dessert, MD 10/17/21 1111

## 2021-10-17 NOTE — Discharge Instructions (Signed)
Strep testing was negative.  Continue with the over-the-counter products you have been using.  Would suspect that symptoms will improve within the next 3 to 4 days.  Make sure you are drinking plenty of fluids

## 2021-10-17 NOTE — ED Triage Notes (Signed)
Pt. States she is coughing, congested, and has a sore throat. States has been going on for a couple of days. States she has been drinking garlic water and taking a cough pearl. Pain is rated at a 6/10 throat. States she feels like she has been running a fever. Denies headache.

## 2021-12-06 ENCOUNTER — Encounter (INDEPENDENT_AMBULATORY_CARE_PROVIDER_SITE_OTHER): Payer: Self-pay

## 2021-12-19 ENCOUNTER — Other Ambulatory Visit: Payer: Self-pay

## 2021-12-19 ENCOUNTER — Encounter (HOSPITAL_BASED_OUTPATIENT_CLINIC_OR_DEPARTMENT_OTHER): Payer: Self-pay | Admitting: Emergency Medicine

## 2021-12-19 ENCOUNTER — Emergency Department (HOSPITAL_BASED_OUTPATIENT_CLINIC_OR_DEPARTMENT_OTHER)
Admission: EM | Admit: 2021-12-19 | Discharge: 2021-12-19 | Disposition: A | Discharge status: Home / Self Care | Payer: Medicaid Other | Attending: Emergency Medicine | Admitting: Emergency Medicine

## 2021-12-19 DIAGNOSIS — K0889 Other specified disorders of teeth and supporting structures: Secondary | ICD-10-CM

## 2021-12-19 DIAGNOSIS — R110 Nausea: Secondary | ICD-10-CM | POA: Insufficient documentation

## 2021-12-19 MED ORDER — ONDANSETRON 4 MG PO TBDP: 4.0000 mg | ORAL_TABLET | Freq: Three times a day (TID) | ORAL | 0 refills | Status: DC | PRN

## 2021-12-19 MED ORDER — OXYCODONE-ACETAMINOPHEN 5-325 MG PO TABS: 1.0000 | ORAL_TABLET | Freq: Four times a day (QID) | ORAL | 0 refills | Status: DC | PRN

## 2021-12-19 MED ORDER — CLINDAMYCIN HCL 300 MG PO CAPS: 300.0000 mg | ORAL_CAPSULE | Freq: Three times a day (TID) | ORAL | 0 refills | Status: DC

## 2021-12-19 NOTE — ED Provider Notes (Signed)
[facility] Provider Note   CSN: [id number] Arrival date & time: 12/19/21  1829     History  Chief Complaint  Patient presents with   Dental Pain    Tracy Valenzuela is a 46 y.o. female.   Dental Pain Patient presents with jaw/dental pain.  Has had for the last 2 days.  States upper and lower jaw.  States her sister had died and she has been grinding her teeth since.  Not really having dental issues before now.  No fevers.  Does have a little nauseousness.  No swelling.  Pain worse with eating.     Home Medications Prior to Admission medications   Medication Sig Start Date End Date Taking? Authorizing Provider  clindamycin (CLEOCIN) 300 MG capsule Take 1 capsule (300 mg total) by mouth 3 (three) times daily. 12/19/21  Yes Davonna Belling, MD  ondansetron (ZOFRAN-ODT) 4 MG disintegrating tablet Take 1 tablet (4 mg total) by mouth every 8 (eight) hours as needed for nausea or vomiting. 12/19/21  Yes Davonna Belling, MD  oxyCODONE-acetaminophen (PERCOCET/ROXICET) 5-325 MG tablet Take 1 tablet by mouth every 6 (six) hours as needed for severe pain. 12/19/21  Yes Davonna Belling, MD  albuterol (VENTOLIN HFA) 108 (90 Base) MCG/ACT inhaler Inhale 2 puffs into the lungs every 6 (six) hours as needed for wheezing or shortness of breath. 05/02/20   Brunetta Jeans, PA-C  budesonide-formoterol [facility]) 160-4.5 MCG/ACT inhaler Inhale 2 puffs into the lungs 2 (two) times daily. 12/16/20   Chesley Mires, MD  esomeprazole (NEXIUM) 40 MG capsule Take 1 capsule (40 mg total) by mouth 2 (two) times daily before a meal. 09/02/20   Armbruster, Carlota Raspberry, MD  famotidine (PEPCID) 40 MG tablet Take 1 tablet (40 mg total) by mouth at bedtime. 04/06/20   Brunetta Jeans, PA-C  guaiFENesin-codeine (ROBITUSSIN AC) 100-10 MG/5ML syrup Take 10 mLs by mouth 3 (three) times daily as needed for cough. 11/08/20   Midge Minium, MD  hydrochlorothiazide (HYDRODIURIL) 25 MG tablet  Take 1 tablet (25 mg total) by mouth daily. 04/06/20   Brunetta Jeans, PA-C  levocetirizine (XYZAL) 5 MG tablet Take 1 tablet (5 mg total) by mouth every evening. 09/21/19   Brunetta Jeans, PA-C  mometasone (NASONEX) 50 MCG/ACT nasal spray Place into the nose. 03/29/20   [provider]  montelukast (SINGULAIR) 10 MG tablet Take 1 tablet (10 mg total) by mouth at bedtime. 12/16/20   Chesley Mires, MD  mupirocin ointment (BACTROBAN) 2 % Apply 1 application topically 2 (two) times daily. 09/20/20   Maximiano Coss, NP  NP THYROID 30 MG tablet Take 30 mg by mouth daily. 11/20/19   [provider]  polyethylene glycol (MIRALAX) 17 g packet Take 17 g by mouth daily. 09/02/20   Armbruster, Carlota Raspberry, MD  potassium chloride SA (KLOR-CON) 20 MEQ tablet Take 1 tablet (20 mEq total) by mouth daily. 04/06/20   Brunetta Jeans, PA-C  VITAMIN D PO Take by mouth.    [provider]      Allergies    Penicillins    Review of Systems   Review of Systems  Physical Exam Updated Vital Signs BP (!) 141/77   Pulse (!) 56   Temp 98.5 F (36.9 C)   Resp 18   Ht '5\' 7"'$  (1.702 m)   Wt 122.9 kg   LMP  (LMP Unknown)   SpO2 99%   BMI 42.44 kg/m  Physical Exam Vitals and nursing  note reviewed.  HENT:     Mouth/Throat:     Comments: No facial swelling.  Some tenderness over most posterior tooth on upper and lower jaw on the right side.  Particularly on upper.  No swelling of the jaw. Skin:    General: Skin is warm.  Neurological:     Mental Status: She is alert.     ED Results / Procedures / Treatments   Labs (all labs ordered are listed, but only abnormal results are displayed) Labs Reviewed - No data to display  EKG None  Radiology No results found.  Procedures Procedures    Medications Ordered in ED Medications - No data to display  ED Course/ Medical Decision Making/ A&P                           Medical Decision Making Risk Prescription drug  management.   Patient with dental pain.  Some tenderness over teeth.  No large abscess seen.  No swelling of floor of mouth.  No swelling of cheek.  Will give antibiotics and pain medicine.  Also with some nausea and will give antiemetics.  States she had called the dentist yesterday but does not have an appointment yet.  Given information for our dentist on-call.  Will discharge.        Final Clinical Impression(s) / ED Diagnoses Final diagnoses:  Pain, dental    Rx / DC Orders ED Discharge Orders          Ordered    ondansetron (ZOFRAN-ODT) 4 MG disintegrating tablet  Every 8 hours PRN        12/19/21 0733    oxyCODONE-acetaminophen (PERCOCET/ROXICET) 5-325 MG tablet  Every 6 hours PRN        12/19/21 0733    clindamycin (CLEOCIN) 300 MG capsule  3 times daily        12/19/21 0734              Davonna Belling, MD 12/19/21 [phone]

## 2021-12-19 NOTE — ED Triage Notes (Signed)
Pt c/o right sided dental pain x 2 days. She states she may have an infection

## 2022-05-17 ENCOUNTER — Telehealth: Payer: Self-pay

## 2022-05-17 NOTE — Telephone Encounter (Signed)
Mychart msg sent. AS, CMA 

## 2022-06-19 ENCOUNTER — Emergency Department
Admission: EM | Admit: 2022-06-19 | Discharge: 2022-06-19 | Disposition: A | Discharge status: Home / Self Care | Payer: Commercial Managed Care - HMO | Attending: Emergency Medicine | Admitting: Emergency Medicine

## 2022-06-19 ENCOUNTER — Other Ambulatory Visit: Payer: Self-pay

## 2022-06-19 ENCOUNTER — Emergency Department: Payer: Commercial Managed Care - HMO

## 2022-06-19 ENCOUNTER — Encounter: Payer: Self-pay | Admitting: Emergency Medicine

## 2022-06-19 DIAGNOSIS — I10 Essential (primary) hypertension: Secondary | ICD-10-CM | POA: Insufficient documentation

## 2022-06-19 DIAGNOSIS — J45901 Unspecified asthma with (acute) exacerbation: Secondary | ICD-10-CM

## 2022-06-19 DIAGNOSIS — Z1152 Encounter for screening for COVID-19: Secondary | ICD-10-CM | POA: Insufficient documentation

## 2022-06-19 DIAGNOSIS — J209 Acute bronchitis, unspecified: Secondary | ICD-10-CM | POA: Diagnosis not present

## 2022-06-19 DIAGNOSIS — J45909 Unspecified asthma, uncomplicated: Secondary | ICD-10-CM | POA: Diagnosis not present

## 2022-06-19 DIAGNOSIS — R059 Cough, unspecified: Secondary | ICD-10-CM | POA: Diagnosis not present

## 2022-06-19 DIAGNOSIS — E039 Hypothyroidism, unspecified: Secondary | ICD-10-CM | POA: Insufficient documentation

## 2022-06-19 LAB — RESP PANEL BY RT-PCR (RSV, FLU A&B, COVID)  RVPGX2
Influenza A by PCR: NEGATIVE
Influenza B by PCR: NEGATIVE
Resp Syncytial Virus by PCR: NEGATIVE
SARS Coronavirus 2 by RT PCR: NEGATIVE

## 2022-06-19 MED ORDER — IPRATROPIUM-ALBUTEROL 0.5-2.5 (3) MG/3ML IN SOLN
3.0000 mL | Freq: Once | RESPIRATORY_TRACT | Status: AC
  Administered 2022-06-19: 3 mL via RESPIRATORY_TRACT
  Filled 2022-06-19: qty 3

## 2022-06-19 MED ORDER — ALBUTEROL SULFATE HFA 108 (90 BASE) MCG/ACT IN AERS: 2.0000 | INHALATION_SPRAY | Freq: Four times a day (QID) | RESPIRATORY_TRACT | 2 refills | Status: DC | PRN

## 2022-06-19 MED ORDER — PREDNISONE 50 MG PO TABS: ORAL_TABLET | ORAL | 0 refills | Status: DC

## 2022-06-19 MED ORDER — PREDNISONE 20 MG PO TABS
60.0000 mg | ORAL_TABLET | Freq: Once | ORAL | Status: AC
  Administered 2022-06-19: 60 mg via ORAL
  Filled 2022-06-19: qty 3

## 2022-06-19 NOTE — Discharge Instructions (Addendum)
Your chest x-ray was negative for pneumonia.  Your COVID and flu test is negative.  I suspect that you have bronchitis causing exacerbation of your asthma.  Please take the prednisone once daily for the next 4 days starting tomorrow.  Please use your albuterol inhaler 2 to 4 puffs every 4 hours as needed for shortness of breath.

## 2022-06-19 NOTE — ED Provider Notes (Signed)
[facility] Provider Note    Event Date/Time   First MD Initiated Contact with Patient 06/19/22 0421     (approximate)   History   Cough   HPI  Tracy Valenzuela is a 47 y.o. female with past medical history of asthma, bipolar disease, anemia, hypertension hypothyroidism who presents because of cough and shortness of breath.  Symptoms started 3 days ago.  Patient Dors is cooperative green sputum.  Today noticed some streaks of blood in it but no large-volume hemoptysis.  She does feel short of breath.  Has asthma uses Symbicort does not have rescue inhaler at home.  Is never required admission for her asthma.  She denies any pain in her chest or pain with coughing.  Did have subjective fever at home yesterday.  Had some diarrhea this morning and vomiting yesterday but tolerating p.o.  Denies abdominal pain.  Patient works at an [employer] and flu been going on.  Did take a COVID test at her work and this was negative.     Past Medical History:  Diagnosis Date   Alcohol abuse    Allergy    SEASONAL   Anemia    Anxiety    Arthritis    BACK,KNESS-BILATERAL   ASCUS with positive high risk HPV 11/2014   Asthma    Bipolar 1 disorder (HCC)    Constipation    COVID-19 virus infection 03/2019   Depression    Fibromyalgia    GERD (gastroesophageal reflux disease)    Heart murmur    Hypertension    Hypothyroidism    LGSIL (low grade squamous intraepithelial dysplasia) 11/2014   colposcopy biopsy.  recommend follow up pap in one year   Obesity    Prediabetes    Vitamin D deficiency     Patient Active Problem List   Diagnosis Date Noted   Positive ANA (antinuclear antibody) 04/18/2020   Essential hypertension 10/06/2019   Elevated cholesterol 08/20/2019   COVID-19 virus infection 04/03/2019   Obstructive sleep apnea of adult 05/15/2018   Obesity (BMI 35.0-39.9 without comorbidity) 05/15/2018   Hypothyroidism 05/15/2018   Fibromyalgia  09/27/2017   Primary osteoarthritis of both knees 09/27/2017   Vitamin D deficiency 09/27/2017   Leiomyoma 10/30/2016   Prediabetes 08/21/2016   Gastroesophageal reflux disease 08/21/2016   Thornwaldt's cyst 05/29/2016   Morbid obesity ([place]) 11/09/2015   Decreased attention Span 09/30/2015   Iron deficiency anemia 10/19/2014     Physical Exam  Triage Vital Signs: ED Triage Vitals  Enc Vitals Group     BP 06/19/22 0415 (!) 150/99     Pulse Rate 06/19/22 0415 75     Resp 06/19/22 0415 18     Temp 06/19/22 0415 98.3 F (36.8 C)     Temp Source 06/19/22 0415 Oral     SpO2 06/19/22 0415 95 %     Weight 06/19/22 0411 270 lb (122.5 kg)     Height 06/19/22 0411 5' 6"$  (1.676 m)     Head Circumference --      Peak Flow --      Pain Score --      Pain Loc --      Pain Edu? --      Excl. in [place]? --     Most recent vital signs: Vitals:   06/19/22 0415  BP: (!) 150/99  Pulse: 75  Resp: 18  Temp: 98.3 F (36.8 C)  SpO2: 95%     General:  Awake, no distress.  CV:  Good peripheral perfusion.  No edema or leg asymmetry Resp:  Normal effort.  End expiratory wheeze with good air movement Abd:  No distention.  Neuro:             Awake, Alert, Oriented x 3  Other:     ED Results / Procedures / Treatments  Labs (all labs ordered are listed, but only abnormal results are displayed) Labs Reviewed  RESP PANEL BY RT-PCR (RSV, FLU A&B, COVID)  [id number]     EKG     RADIOLOGY I reviewed and interpreted the CXR which does not show any acute cardiopulmonary process    PROCEDURES:  Critical Care performed: No  Procedures    MEDICATIONS ORDERED IN ED: Medications  ipratropium-albuterol (DUONEB) 0.5-2.5 (3) MG/3ML nebulizer solution 3 mL (3 mLs Nebulization Given 06/19/22 0456)  ipratropium-albuterol (DUONEB) 0.5-2.5 (3) MG/3ML nebulizer solution 3 mL (3 mLs Nebulization Given 06/19/22 0456)  predniSONE (DELTASONE) tablet 60 mg (60 mg Oral Given 06/19/22 0456)      IMPRESSION / MDM / ASSESSMENT AND PLAN / ED COURSE  I reviewed the triage vital signs and the nursing notes.                              Patient's presentation is most consistent with acute complicated illness / injury requiring diagnostic workup.  Differential diagnosis includes, but is not limited to, bronchitis, asthma exacerbation, viral illness including COVID-19, influenza, pneumonia, less likely pulmonary embolism  The patient is a 47 year old female with history of asthma who presents because of cough productive of green sputum and shortness of breath times several days.  It is green with some streaks of blood this morning.  She denies any associated chest pain did have subjective fever at home.  Has had some GI symptoms as well diarrhea and vomiting but tolerating p.o. without abdominal pain.  Patient's vitals are notable for hypertension.  She is saturating 98% when I am in the room.  She overall looks well nontoxic she does have some end expiratory wheezing but is moving good air.  No signs of DVT.  Overall suspect exacerbation of asthma due to underlying viral bronchitis.  Will test for COVID and flu.  Will obtain chest x-ray to evaluate for pneumonia.  I have low suspicion for pulmonary embolism suspect that the hemoptysis is due to bronchitis.  Will give DuoNeb and a dose of prednisone.  Patient's chest x-ray does not suggest pneumonia.  On reassessment after DuoNeb's and prednisone she is feeling improved no longer wheezing.  Will discharge with albuterol inhaler she does not have this at home and 5 days of prednisone.  We discussed return precautions.      FINAL CLINICAL IMPRESSION(S) / ED DIAGNOSES   Final diagnoses:  Acute bronchitis, unspecified organism  Exacerbation of asthma, unspecified asthma severity, unspecified whether persistent     Rx / DC Orders   ED Discharge Orders          Ordered    albuterol (VENTOLIN HFA) 108 (90 Base) MCG/ACT inhaler  Every 6  hours PRN        06/19/22 0532    predniSONE (DELTASONE) 50 MG tablet        06/19/22 0532             Note:  This document was prepared using Dragon voice recognition software and may include unintentional dictation errors.   Starleen Blue,  Jennette Dubin, MD 06/19/22 [phone]

## 2022-06-19 NOTE — ED Triage Notes (Signed)
Patient ambulatory to triage with steady gait, without difficulty or distress noted; pt reports since yesterday  having prod cough green mucous, congestion, and fever; took covid test at work which was negative

## 2022-09-03 ENCOUNTER — Emergency Department (HOSPITAL_BASED_OUTPATIENT_CLINIC_OR_DEPARTMENT_OTHER)
Admission: EM | Admit: 2022-09-03 | Discharge: 2022-09-03 | Disposition: A | Discharge status: Home / Self Care | Payer: Medicaid Other | Attending: Emergency Medicine | Admitting: Emergency Medicine

## 2022-09-03 ENCOUNTER — Emergency Department (HOSPITAL_BASED_OUTPATIENT_CLINIC_OR_DEPARTMENT_OTHER): Payer: Medicaid Other | Admitting: Radiology

## 2022-09-03 ENCOUNTER — Other Ambulatory Visit: Payer: Self-pay

## 2022-09-03 ENCOUNTER — Other Ambulatory Visit (HOSPITAL_BASED_OUTPATIENT_CLINIC_OR_DEPARTMENT_OTHER): Payer: Self-pay

## 2022-09-03 DIAGNOSIS — R051 Acute cough: Secondary | ICD-10-CM

## 2022-09-03 DIAGNOSIS — B9789 Other viral agents as the cause of diseases classified elsewhere: Secondary | ICD-10-CM | POA: Diagnosis not present

## 2022-09-03 DIAGNOSIS — E039 Hypothyroidism, unspecified: Secondary | ICD-10-CM | POA: Insufficient documentation

## 2022-09-03 DIAGNOSIS — J069 Acute upper respiratory infection, unspecified: Secondary | ICD-10-CM | POA: Insufficient documentation

## 2022-09-03 DIAGNOSIS — J45909 Unspecified asthma, uncomplicated: Secondary | ICD-10-CM | POA: Insufficient documentation

## 2022-09-03 DIAGNOSIS — R0789 Other chest pain: Secondary | ICD-10-CM | POA: Diagnosis not present

## 2022-09-03 DIAGNOSIS — Z1152 Encounter for screening for COVID-19: Secondary | ICD-10-CM | POA: Insufficient documentation

## 2022-09-03 DIAGNOSIS — I10 Essential (primary) hypertension: Secondary | ICD-10-CM | POA: Insufficient documentation

## 2022-09-03 DIAGNOSIS — R059 Cough, unspecified: Secondary | ICD-10-CM | POA: Diagnosis not present

## 2022-09-03 LAB — SARS CORONAVIRUS 2 BY RT PCR: SARS Coronavirus 2 by RT PCR: NEGATIVE

## 2022-09-03 MED ORDER — PREDNISONE 20 MG PO TABS: 40.0000 mg | ORAL_TABLET | Freq: Every day | ORAL | 0 refills | Status: DC

## 2022-09-03 MED ORDER — BENZONATATE 100 MG PO CAPS
100.0000 mg | ORAL_CAPSULE | Freq: Three times a day (TID) | ORAL | 0 refills | Status: DC
Start: 2022-09-03 — End: 2023-09-20

## 2022-09-03 MED ORDER — IPRATROPIUM-ALBUTEROL 0.5-2.5 (3) MG/3ML IN SOLN
3.0000 mL | Freq: Once | RESPIRATORY_TRACT | Status: AC
  Administered 2022-09-03: 3 mL via RESPIRATORY_TRACT
  Filled 2022-09-03: qty 3

## 2022-09-03 MED ORDER — ALBUTEROL SULFATE (2.5 MG/3ML) 0.083% IN NEBU
2.5000 mg | INHALATION_SOLUTION | Freq: Once | RESPIRATORY_TRACT | Status: AC
  Administered 2022-09-03: 2.5 mg via RESPIRATORY_TRACT
  Filled 2022-09-03: qty 3

## 2022-09-03 MED ORDER — ALBUTEROL SULFATE HFA 108 (90 BASE) MCG/ACT IN AERS
2.0000 | INHALATION_SPRAY | RESPIRATORY_TRACT | Status: DC | PRN
  Administered 2022-09-03: 2 via RESPIRATORY_TRACT
  Filled 2022-09-03: qty 6.7

## 2022-09-03 NOTE — ED Triage Notes (Signed)
Pt via pov from home with chest tightness, cough, congestion, loss of smell x 3 days. Pt reports that it has been getting worse. She endorses fever & chills. Pt alert & oriented, nad noted.

## 2022-09-03 NOTE — ED Notes (Signed)
Discharge instructions, follow up care, and prescriptions reviewed and explained, pt verbalized understanding and had no further questions on d/c. Pt caox4, ambulatory, NAD on d/c.  

## 2022-09-03 NOTE — Discharge Instructions (Signed)
Please read and follow all provided instructions.  Your diagnoses today include:  1. Viral upper respiratory tract infection     Tests performed today include: Chest x-ray - does not show any pneumonia EKG - no new changes noted COVID test - pending results Vital signs. See below for your results today.   Medications prescribed:  Prednisone - steroid medicine   It is best to take this medication in the morning to prevent sleeping problems. If you are diabetic, monitor your blood sugar closely and stop taking Prednisone if blood sugar is over 300. Take with food to prevent stomach upset.   Albuterol inhaler - medication that opens up your airway  Use inhaler as follows: 1-2 puffs with spacer every 4 hours as needed for wheezing, cough, or shortness of breath.   Tessalon Perles - cough suppressant medication  Take any prescribed medications only as directed.  Home care instructions:  Follow any educational materials contained in this packet.  Follow-up instructions: Please follow-up with your primary care provider in the next 3 days for further evaluation of your symptoms and a recheck if you are not feeling better.   Return instructions:  Please return to the Emergency Department if you experience worsening symptoms. Please return with worsening wheezing, shortness of breath, or difficulty breathing. Return with persistent fever above 101F.  Please return if you have any other emergent concerns.  Additional Information:  Your vital signs today were: Pulse 67   Temp 98.2 F (36.8 C) (Oral)   Resp 16   Ht 5\' 6"  (1.676 m)   Wt 119.7 kg   LMP  (LMP Unknown)   SpO2 97%   BMI 42.61 kg/m  If your blood pressure (BP) was elevated above 135/85 this visit, please have this repeated by your doctor within one month. --------------

## 2022-09-03 NOTE — ED Provider Notes (Signed)
[place] EMERGENCY DEPARTMENT AT [facility] Provider Note   CSN: [id number] Arrival date & time: 09/03/22  4098     History  Chief Complaint  Patient presents with   Chest Pain   Cough    Tracy Valenzuela is a 47 y.o. female.  Patient with h/o asthma, bipolar disease, anemia, hypertension, hypothyroidism -- presents to the ED for evaluation of cough and chest tightness.  Symptoms started 3 to 4 days ago.  She has had a cough productive of green sputum.  She reports subjective fever and chills.  She has had associated headache, nasal congestion.  She has had some diarrhea without vomiting.  She has tried over-the-counter medication which has not helped.  She has tried her home albuterol inhaler which has not helped.       Home Medications Prior to Admission medications   Medication Sig Start Date End Date Taking? Authorizing Provider  albuterol (VENTOLIN HFA) 108 (90 Base) MCG/ACT inhaler Inhale 2 puffs into the lungs every 6 (six) hours as needed for wheezing or shortness of breath. 05/02/20   Waldon Merl, PA-C  albuterol (VENTOLIN HFA) 108 (90 Base) MCG/ACT inhaler Inhale 2 puffs into the lungs every 6 (six) hours as needed for wheezing or shortness of breath. 06/19/22   Georga Hacking, MD  budesonide-formoterol [facility]) 160-4.5 MCG/ACT inhaler Inhale 2 puffs into the lungs 2 (two) times daily. 12/16/20   Coralyn Helling, MD  clindamycin (CLEOCIN) 300 MG capsule Take 1 capsule (300 mg total) by mouth 3 (three) times daily. 12/19/21   Benjiman Core, MD  esomeprazole (NEXIUM) 40 MG capsule Take 1 capsule (40 mg total) by mouth 2 (two) times daily before a meal. 09/02/20   Armbruster, Willaim Rayas, MD  famotidine (PEPCID) 40 MG tablet Take 1 tablet (40 mg total) by mouth at bedtime. 04/06/20   Waldon Merl, PA-C  guaiFENesin-codeine (ROBITUSSIN AC) 100-10 MG/5ML syrup Take 10 mLs by mouth 3 (three) times daily as needed for cough. 11/08/20   Sheliah Hatch, MD   hydrochlorothiazide (HYDRODIURIL) 25 MG tablet Take 1 tablet (25 mg total) by mouth daily. 04/06/20   Waldon Merl, PA-C  levocetirizine (XYZAL) 5 MG tablet Take 1 tablet (5 mg total) by mouth every evening. 09/21/19   Waldon Merl, PA-C  mometasone (NASONEX) 50 MCG/ACT nasal spray Place into the nose. 03/29/20   [provider]  montelukast (SINGULAIR) 10 MG tablet Take 1 tablet (10 mg total) by mouth at bedtime. 12/16/20   Coralyn Helling, MD  mupirocin ointment (BACTROBAN) 2 % Apply 1 application topically 2 (two) times daily. 09/20/20   Janeece Agee, NP  NP THYROID 30 MG tablet Take 30 mg by mouth daily. 11/20/19   [provider]  ondansetron (ZOFRAN-ODT) 4 MG disintegrating tablet Take 1 tablet (4 mg total) by mouth every 8 (eight) hours as needed for nausea or vomiting. 12/19/21   Benjiman Core, MD  oxyCODONE-acetaminophen (PERCOCET/ROXICET) 5-325 MG tablet Take 1 tablet by mouth every 6 (six) hours as needed for severe pain. 12/19/21   Benjiman Core, MD  polyethylene glycol (MIRALAX) 17 g packet Take 17 g by mouth daily. 09/02/20   Armbruster, Willaim Rayas, MD  potassium chloride SA (KLOR-CON) 20 MEQ tablet Take 1 tablet (20 mEq total) by mouth daily. 04/06/20   Waldon Merl, PA-C  predniSONE (DELTASONE) 50 MG tablet Take 1 pill daily for the next 4 days. 06/19/22   Georga Hacking, MD  VITAMIN D PO Take  by mouth.    [provider]      Allergies    Penicillins    Review of Systems   Review of Systems  Physical Exam Updated Vital Signs Ht 5\' 6"  (1.676 m)   Wt 119.7 kg   LMP  (LMP Unknown)   BMI 42.61 kg/m   Physical Exam Vitals and nursing note reviewed.  Constitutional:      Appearance: She is well-developed.  HENT:     Head: Normocephalic and atraumatic.     Jaw: No trismus.     Right Ear: Tympanic membrane, ear canal and external ear normal.     Left Ear: Tympanic membrane, ear canal and external ear normal.     Nose: Congestion  present. No mucosal edema or rhinorrhea.     Mouth/Throat:     Mouth: Mucous membranes are moist. Mucous membranes are not dry. No oral lesions.     Pharynx: Uvula midline. No oropharyngeal exudate, posterior oropharyngeal erythema or uvula swelling.     Tonsils: No tonsillar abscesses.  Eyes:     General:        Right eye: No discharge.        Left eye: No discharge.     Conjunctiva/sclera: Conjunctivae normal.  Cardiovascular:     Rate and Rhythm: Normal rate and regular rhythm.     Heart sounds: Normal heart sounds.  Pulmonary:     Effort: Pulmonary effort is normal. No respiratory distress.     Breath sounds: Wheezing and rhonchi present. No rales.     Comments: Mild and expiratory wheezing, scattered rhonchi Abdominal:     Palpations: Abdomen is soft.     Tenderness: There is no abdominal tenderness.  Musculoskeletal:     Cervical back: Normal range of motion and neck supple.     Right lower leg: No tenderness. No edema.     Left lower leg: No tenderness. No edema.  Lymphadenopathy:     Cervical: No cervical adenopathy.  Skin:    General: Skin is warm and dry.  Neurological:     Mental Status: She is alert.  Psychiatric:        Mood and Affect: Mood normal.     ED Results / Procedures / Treatments   Labs (all labs ordered are listed, but only abnormal results are displayed) Labs Reviewed  SARS CORONAVIRUS 2 BY RT PCR    EKG None  Radiology DG Chest 2 View  Result Date: 09/03/2022 CLINICAL DATA:  Cough Chest tightness EXAM: CHEST - 2 VIEW COMPARISON:  06/19/2022 FINDINGS: Cardiomediastinal silhouette and pulmonary vasculature are within normal limits. Lungs are clear. IMPRESSION: No acute cardiopulmonary process. Electronically Signed   By: Acquanetta Belling M.D.   On: 09/03/2022 10:52    Procedures Procedures    Medications Ordered in ED Medications - No data to display  ED Course/ Medical Decision Making/ A&P    Patient seen and examined. History obtained  directly from patient.   Labs/EKG: Ordered COVID, EKG.  T wave inversions noted, reviewed with Dr. Anitra Lauth, unchanged from old.  Imaging: Ordered chest x-ray.  Medications/Fluids: Ordered: Albuterol and Atrovent.   Most recent vital signs reviewed and are as follows: Pulse 68   Temp 98.2 F (36.8 C) (Oral)   Resp 16   Ht 5\' 6"  (1.676 m)   Wt 119.7 kg   LMP  (LMP Unknown)   SpO2 99%   BMI 42.61 kg/m   Initial impression: Upper respiratory infection with cough,  possible asthma exacerbation  11:28 AM Reassessment performed. Patient appears stable.  Occasional coughing  Labs personally reviewed and interpreted including: COVID-negative  Imaging personally visualized and interpreted including: Chest x-ray agree negative  Reviewed pertinent lab work and imaging with patient at bedside. Questions answered.   Most current vital signs reviewed and are as follows: BP 139/66   Pulse 67   Temp 98.2 F (36.8 C) (Oral)   Resp 16   Ht 5\' 6"  (1.676 m)   Wt 119.7 kg   LMP  (LMP Unknown)   SpO2 97%   BMI 42.61 kg/m   Plan: Discharge to home.   Prescriptions written for: Prednisone x 5 days, Tessalon, continued use of home albuterol inhaler  Other home care instructions discussed: Rest, hydration, inhaler every 4 hours as needed, OTC meds as needed  ED return instructions discussed: Return with worsening shortness of breath, trouble breathing, persistent vomiting, high fevers  Follow-up instructions discussed: Patient encouraged to follow-up with their PCP in 5 days if not improving.                              Medical Decision Making Amount and/or Complexity of Data Reviewed Radiology: ordered.  Risk Prescription drug management.   Patient with likely viral bronchitis superimposed on asthma exacerbation. No concern for PNA given x-ray. Antibiotics not indicated. Conservative therapy indicated.  Low concern for ACS, PE, heart failure.         Final Clinical  Impression(s) / ED Diagnoses Final diagnoses:  Viral upper respiratory tract infection  Acute cough    Rx / DC Orders ED Discharge Orders          Ordered    predniSONE (DELTASONE) 20 MG tablet  Daily        09/03/22 1127    benzonatate (TESSALON) 100 MG capsule  Every 8 hours        09/03/22 1127              Renne Crigler, PA-C 09/03/22 1129    Gwyneth Sprout, MD 09/03/22 1516

## 2022-09-21 ENCOUNTER — Ambulatory Visit: Payer: Medicaid Other | Admitting: Family

## 2022-12-19 ENCOUNTER — Other Ambulatory Visit: Payer: Self-pay

## 2022-12-19 ENCOUNTER — Emergency Department (HOSPITAL_COMMUNITY)
Admission: EM | Admit: 2022-12-19 | Discharge: 2022-12-19 | Disposition: A | Discharge status: Home / Self Care | Payer: Medicaid Other

## 2022-12-19 ENCOUNTER — Encounter (HOSPITAL_COMMUNITY): Payer: Self-pay

## 2022-12-19 ENCOUNTER — Emergency Department (HOSPITAL_COMMUNITY): Payer: Medicaid Other

## 2022-12-19 DIAGNOSIS — Z79899 Other long term (current) drug therapy: Secondary | ICD-10-CM | POA: Insufficient documentation

## 2022-12-19 DIAGNOSIS — Z20822 Contact with and (suspected) exposure to covid-19: Secondary | ICD-10-CM | POA: Diagnosis not present

## 2022-12-19 DIAGNOSIS — J45901 Unspecified asthma with (acute) exacerbation: Secondary | ICD-10-CM | POA: Insufficient documentation

## 2022-12-19 DIAGNOSIS — J4521 Mild intermittent asthma with (acute) exacerbation: Secondary | ICD-10-CM | POA: Diagnosis not present

## 2022-12-19 DIAGNOSIS — B349 Viral infection, unspecified: Secondary | ICD-10-CM | POA: Insufficient documentation

## 2022-12-19 DIAGNOSIS — R0602 Shortness of breath: Secondary | ICD-10-CM | POA: Diagnosis not present

## 2022-12-19 LAB — RESP PANEL BY RT-PCR (RSV, FLU A&B, COVID)  RVPGX2
Influenza A by PCR: NEGATIVE
Influenza B by PCR: NEGATIVE
Resp Syncytial Virus by PCR: NEGATIVE
SARS Coronavirus 2 by RT PCR: NEGATIVE

## 2022-12-19 MED ORDER — DEXAMETHASONE 4 MG PO TABS
6.0000 mg | ORAL_TABLET | Freq: Once | ORAL | Status: AC
  Administered 2022-12-19: 6 mg via ORAL
  Filled 2022-12-19: qty 1

## 2022-12-19 MED ORDER — ALBUTEROL SULFATE HFA 108 (90 BASE) MCG/ACT IN AERS: 2.0000 | INHALATION_SPRAY | RESPIRATORY_TRACT | 0 refills | Status: DC | PRN

## 2022-12-19 MED ORDER — IPRATROPIUM-ALBUTEROL 0.5-2.5 (3) MG/3ML IN SOLN
3.0000 mL | Freq: Once | RESPIRATORY_TRACT | Status: AC
  Administered 2022-12-19: 3 mL via RESPIRATORY_TRACT
  Filled 2022-12-19: qty 3

## 2022-12-19 MED ORDER — ALBUTEROL SULFATE HFA 108 (90 BASE) MCG/ACT IN AERS
2.0000 | INHALATION_SPRAY | Freq: Once | RESPIRATORY_TRACT | Status: AC
  Administered 2022-12-19: 2 via RESPIRATORY_TRACT
  Filled 2022-12-19: qty 6.7

## 2022-12-19 NOTE — ED Provider Notes (Signed)
[facility] AT [facility] Provider Note   CSN: [id number] Arrival date & time: 12/19/22  4098     History  Chief Complaint  Patient presents with   Shortness of Breath    Tracy Valenzuela is a 47 y.o. female.  47 year old female presenting emergency department chief complaint shortness of breath.  Reportedly has a history of asthma, out of her albuterol.  She had viral URI last week with rhinorrhea, congestion, sore throat and generalized malaise.  She reports that she was feeling better the past few days, but awoke this morning with worsening shortness of breath.  She notes that she has had a productive cough with clear sputum.  No fevers.  No chest pain.   Shortness of Breath      Home Medications Prior to Admission medications   Medication Sig Start Date End Date Taking? Authorizing Provider  albuterol (VENTOLIN HFA) 108 (90 Base) MCG/ACT inhaler Inhale 2 puffs into the lungs every 4 (four) hours as needed for wheezing or shortness of breath. 12/19/22  Yes Priseis Cratty J, DO  albuterol (VENTOLIN HFA) 108 (90 Base) MCG/ACT inhaler Inhale 2 puffs into the lungs every 6 (six) hours as needed for wheezing or shortness of breath. 05/02/20   Waldon Merl, PA-C  albuterol (VENTOLIN HFA) 108 (90 Base) MCG/ACT inhaler Inhale 2 puffs into the lungs every 6 (six) hours as needed for wheezing or shortness of breath. 06/19/22   Georga Hacking, MD  benzonatate (TESSALON) 100 MG capsule Take 1 capsule (100 mg total) by mouth every 8 (eight) hours. 09/03/22   Renne Crigler, PA-C  budesonide-formoterol (SYMBICORT) 160-4.5 MCG/ACT inhaler Inhale 2 puffs into the lungs 2 (two) times daily. 12/16/20   Coralyn Helling, MD  clindamycin (CLEOCIN) 300 MG capsule Take 1 capsule (300 mg total) by mouth 3 (three) times daily. 12/19/21   Benjiman Core, MD  esomeprazole (NEXIUM) 40 MG capsule Take 1 capsule (40 mg total) by mouth 2 (two) times daily before a meal. 09/02/20    Armbruster, Willaim Rayas, MD  famotidine (PEPCID) 40 MG tablet Take 1 tablet (40 mg total) by mouth at bedtime. 04/06/20   Waldon Merl, PA-C  guaiFENesin-codeine (ROBITUSSIN AC) 100-10 MG/5ML syrup Take 10 mLs by mouth 3 (three) times daily as needed for cough. 11/08/20   Sheliah Hatch, MD  hydrochlorothiazide (HYDRODIURIL) 25 MG tablet Take 1 tablet (25 mg total) by mouth daily. 04/06/20   Waldon Merl, PA-C  levocetirizine (XYZAL) 5 MG tablet Take 1 tablet (5 mg total) by mouth every evening. 09/21/19   Waldon Merl, PA-C  mometasone (NASONEX) 50 MCG/ACT nasal spray Place into the nose. 03/29/20   [provider]  montelukast (SINGULAIR) 10 MG tablet Take 1 tablet (10 mg total) by mouth at bedtime. 12/16/20   Coralyn Helling, MD  mupirocin ointment (BACTROBAN) 2 % Apply 1 application topically 2 (two) times daily. 09/20/20   Janeece Agee, NP  NP THYROID 30 MG tablet Take 30 mg by mouth daily. 11/20/19   [provider]  ondansetron (ZOFRAN-ODT) 4 MG disintegrating tablet Take 1 tablet (4 mg total) by mouth every 8 (eight) hours as needed for nausea or vomiting. 12/19/21   Benjiman Core, MD  oxyCODONE-acetaminophen (PERCOCET/ROXICET) 5-325 MG tablet Take 1 tablet by mouth every 6 (six) hours as needed for severe pain. 12/19/21   Benjiman Core, MD  polyethylene glycol (MIRALAX) 17 g packet Take 17 g by mouth daily. 09/02/20   Armbruster,  Willaim Rayas, MD  potassium chloride SA (KLOR-CON) 20 MEQ tablet Take 1 tablet (20 mEq total) by mouth daily. 04/06/20   Waldon Merl, PA-C  predniSONE (DELTASONE) 20 MG tablet Take 2 tablets (40 mg total) by mouth daily. 09/03/22   Renne Crigler, PA-C  VITAMIN D PO Take by mouth.    [provider]      Allergies    Penicillins    Review of Systems   Review of Systems  Respiratory:  Positive for shortness of breath.     Physical Exam Updated Vital Signs BP (!) 105/58   Pulse 61   Temp 98 F (36.7 C) (Oral)    Resp 18   Ht 5\' 8"  (1.727 m)   Wt 121.6 kg   LMP  (LMP Unknown)   SpO2 94%   BMI 40.75 kg/m  Physical Exam Vitals and nursing note reviewed.  HENT:     Head: Normocephalic.  Cardiovascular:     Rate and Rhythm: Normal rate and regular rhythm.  Pulmonary:     Effort: Pulmonary effort is normal.     Breath sounds: No wheezing, rhonchi or rales.  Chest:     Chest wall: No tenderness.  Musculoskeletal:     Cervical back: Normal range of motion.     Right lower leg: No tenderness. No edema.     Left lower leg: No tenderness. No edema.  Skin:    General: Skin is warm and dry.     Capillary Refill: Capillary refill takes less than 2 seconds.  Neurological:     Mental Status: She is alert and oriented to person, place, and time.  Psychiatric:        Mood and Affect: Mood normal.        Behavior: Behavior normal.     ED Results / Procedures / Treatments   Labs (all labs ordered are listed, but only abnormal results are displayed) Labs Reviewed  RESP PANEL BY RT-PCR (RSV, FLU A&B, COVID)  [id number]    EKG EKG Interpretation Date/Time:  Wednesday December 19 2022 08:49:17 EDT Ventricular Rate:  80 PR Interval:  165 QRS Duration:  90 QT Interval:  381 QTC Calculation: 440 R Axis:   -13  Text Interpretation: Sinus rhythm Borderline repolarization abnormality Confirmed by Estanislado Pandy [phone]) on 12/19/2022 9:06:04 AM  Radiology DG Chest 2 View  Result Date: 12/19/2022 CLINICAL DATA:  Productive cough EXAM: CHEST - 2 VIEW COMPARISON:  X-ray 09/03/2022 FINDINGS: No consolidation, pneumothorax or effusion. Normal cardiopericardial silhouette without edema. Overlapping cardiac leads. IMPRESSION: No acute cardiopulmonary disease. Electronically Signed   By: Karen Kays M.D.   On: 12/19/2022 10:14    Procedures Procedures    Medications Ordered in ED Medications  ipratropium-albuterol (DUONEB) 0.5-2.5 (3) MG/3ML nebulizer solution 3 mL (3 mLs Nebulization Given 12/19/22 0900)   dexamethasone (DECADRON) tablet 6 mg (6 mg Oral Given 12/19/22 0904)  albuterol (VENTOLIN HFA) 108 (90 Base) MCG/ACT inhaler 2 puff (2 puffs Inhalation Given 12/19/22 1049)    ED Course/ Medical Decision Making/ A&P Clinical Course as of 12/19/22 1625  Wed Dec 19, 2022  0951 Resp panel by RT-PCR (RSV, Flu A&B, Covid) Anterior Nasal Swab Negative for flu COVID [TY]  0951 DG Chest 2 View No overt pneumonia on my independent interpretation.  Awaiting radiology overread. [TY]  1018 DG Chest 2 View MPRESSION: No acute cardiopulmonary disease.   [TY]  1019 Reevaluated, feeling much improved.  Still does not appear to be in  respiratory distress.  Lungs clear.  Workup today negative.  She is out of albuterol.  Will give rescue inhaler and discharge to follow-up with primary doctors. [TY]    Clinical Course User Index [TY] Coral Spikes, DO                                 Medical Decision Making Is well-appearing 47 year old female present emergency department shortness of breath in the setting of asthma.  She is afebrile nontachycardic maintaining oxygen saturation on room air.  Does not appear to be in respiratory distress.  Lungs are largely clear to auscultation.  Given productive cough, will get chest x-ray to evaluate for pneumonia.  Will swab for flu/COVID as patient was complaining of viral syndrome type complaints.  She has been out of her albuterol.  Will give DuoNeb and Decadron.  Amount and/or Complexity of Data Reviewed External Data Reviewed:     Details: From pulmonology note last year: "Dyspnea on exertion with cough and wheeze. - with history of asthma, tobacco abuse, and prior COVID 19 infection - change from Qvar to symbicort 160 two puffs bid - add singulair 10 mg qhs - prn albuterol - will arrange for chest xray, PFT, FeNO, CBC with diff, IgE, CMET " Labs:  Decision-making details documented in ED Course.    Details: Consider labs, however stable vital signs physical  exam and history point to more viral syndrome or Radiology: ordered. Decision-making details documented in ED Course.  Risk Prescription drug management.          Final Clinical Impression(s) / ED Diagnoses Final diagnoses:  Mild asthma with exacerbation, unspecified whether persistent  Viral syndrome    Rx / DC Orders ED Discharge Orders          Ordered    albuterol (VENTOLIN HFA) 108 (90 Base) MCG/ACT inhaler  Every 4 hours PRN        12/19/22 1022              Coral Spikes, DO 12/19/22 1625

## 2022-12-19 NOTE — Discharge Instructions (Addendum)
He was seen in the emergency department for your shortness of breath.  It does not appear to be life-threatening or require emergent intervention or hospitalization at this time.  We are prescribing you a refill of your albuterol.  Please follow-up with your primary doctor soon as possible return immediately felt fevers, chills, worsening shortness of breath, chest pain or any new or worsening symptoms that are concerning to you.

## 2022-12-19 NOTE — ED Triage Notes (Signed)
Pt complaining of cough and SOB. States it has been going on for about a week. Denies any fevers, or N/V/D. Pt took a covid test and was negative.

## 2023-01-13 ENCOUNTER — Emergency Department (HOSPITAL_BASED_OUTPATIENT_CLINIC_OR_DEPARTMENT_OTHER): Payer: Medicaid Other | Admitting: Radiology

## 2023-01-13 ENCOUNTER — Other Ambulatory Visit: Payer: Self-pay

## 2023-01-13 ENCOUNTER — Encounter (HOSPITAL_BASED_OUTPATIENT_CLINIC_OR_DEPARTMENT_OTHER): Payer: Self-pay

## 2023-01-13 ENCOUNTER — Emergency Department (HOSPITAL_BASED_OUTPATIENT_CLINIC_OR_DEPARTMENT_OTHER)
Admission: EM | Admit: 2023-01-13 | Discharge: 2023-01-13 | Disposition: A | Discharge status: Home / Self Care | Payer: Medicaid Other

## 2023-01-13 DIAGNOSIS — J4521 Mild intermittent asthma with (acute) exacerbation: Secondary | ICD-10-CM | POA: Insufficient documentation

## 2023-01-13 DIAGNOSIS — B9789 Other viral agents as the cause of diseases classified elsewhere: Secondary | ICD-10-CM | POA: Diagnosis not present

## 2023-01-13 DIAGNOSIS — H6692 Otitis media, unspecified, left ear: Secondary | ICD-10-CM | POA: Insufficient documentation

## 2023-01-13 DIAGNOSIS — H6690 Otitis media, unspecified, unspecified ear: Secondary | ICD-10-CM

## 2023-01-13 DIAGNOSIS — J069 Acute upper respiratory infection, unspecified: Secondary | ICD-10-CM | POA: Diagnosis not present

## 2023-01-13 DIAGNOSIS — Z1152 Encounter for screening for COVID-19: Secondary | ICD-10-CM | POA: Diagnosis not present

## 2023-01-13 DIAGNOSIS — R0602 Shortness of breath: Secondary | ICD-10-CM | POA: Diagnosis present

## 2023-01-13 LAB — RESP PANEL BY RT-PCR (RSV, FLU A&B, COVID)  RVPGX2
Influenza A by PCR: NEGATIVE
Influenza B by PCR: NEGATIVE
Resp Syncytial Virus by PCR: NEGATIVE
SARS Coronavirus 2 by RT PCR: NEGATIVE

## 2023-01-13 MED ORDER — IPRATROPIUM-ALBUTEROL 0.5-2.5 (3) MG/3ML IN SOLN
RESPIRATORY_TRACT | Status: AC
  Administered 2023-01-13: 3 mL via RESPIRATORY_TRACT
  Filled 2023-01-13: qty 3

## 2023-01-13 MED ORDER — CLINDAMYCIN HCL 300 MG PO CAPS: 300.0000 mg | ORAL_CAPSULE | Freq: Three times a day (TID) | ORAL | 0 refills | Status: AC

## 2023-01-13 MED ORDER — PREDNISONE 50 MG PO TABS
60.0000 mg | ORAL_TABLET | Freq: Once | ORAL | Status: AC
  Administered 2023-01-13: 60 mg via ORAL
  Filled 2023-01-13: qty 1

## 2023-01-13 MED ORDER — IPRATROPIUM-ALBUTEROL 0.5-2.5 (3) MG/3ML IN SOLN: 3.0000 mL | Freq: Once | RESPIRATORY_TRACT | Status: AC

## 2023-01-13 MED ORDER — PREDNISONE 10 MG PO TABS: 40.0000 mg | ORAL_TABLET | Freq: Every day | ORAL | 0 refills | Status: AC

## 2023-01-13 MED ORDER — VENTOLIN HFA 108 (90 BASE) MCG/ACT IN AERS: 2.0000 | INHALATION_SPRAY | RESPIRATORY_TRACT | 0 refills | Status: DC | PRN

## 2023-01-13 MED ORDER — IPRATROPIUM-ALBUTEROL 0.5-2.5 (3) MG/3ML IN SOLN
3.0000 mL | Freq: Once | RESPIRATORY_TRACT | Status: AC
  Administered 2023-01-13: 3 mL via RESPIRATORY_TRACT
  Filled 2023-01-13: qty 3

## 2023-01-13 NOTE — ED Notes (Signed)
Discharge paperwork given and verbally understood. 

## 2023-01-13 NOTE — ED Triage Notes (Addendum)
Patient arrives with completes of chest congestion and worsening cough x1 week. Also having left ear fullness as well. Tried OTC medications at home with minimal relief.

## 2023-01-13 NOTE — Discharge Instructions (Signed)
You have a viral URI it should resolve on its own.  Appears you have an ear infection we are prescribing you antibiotics.  As discussed please use your inhaler every 4 hours for the next 24 hours, then every 6 hours for the next day, and then as needed after that.  Take the steroids as prescribed.  Please follow-up with your primary doctor as soon as possible.  Return if develop fevers, chills, chest pain, worsening shortness of breath, difficulty breathing or any new or worsening symptoms that are concerning to you

## 2023-01-13 NOTE — ED Provider Notes (Signed)
[place] EMERGENCY DEPARTMENT AT [facility] Provider Note   CSN: [id number] Arrival date & time: 01/13/23  4696     History  Chief Complaint  Patient presents with   Nasal Congestion   Cough    Tracy Valenzuela is a 47 y.o. female.  47 year old female to the emergency department for URI symptoms for the past week with increased shortness of breath.  History of asthma.  She has been using her inhaler more.  Reports productive cough and chest congestion.   Cough      Home Medications Prior to Admission medications   Medication Sig Start Date End Date Taking? Authorizing Provider  albuterol (VENTOLIN HFA) 108 (90 Base) MCG/ACT inhaler Inhale 2 puffs into the lungs every 6 (six) hours as needed for wheezing or shortness of breath. 05/02/20   Waldon Merl, PA-C  albuterol (VENTOLIN HFA) 108 (90 Base) MCG/ACT inhaler Inhale 2 puffs into the lungs every 6 (six) hours as needed for wheezing or shortness of breath. 06/19/22   Georga Hacking, MD  albuterol (VENTOLIN HFA) 108 (90 Base) MCG/ACT inhaler Inhale 2 puffs into the lungs every 4 (four) hours as needed for wheezing or shortness of breath. 12/19/22   Coral Spikes, DO  benzonatate (TESSALON) 100 MG capsule Take 1 capsule (100 mg total) by mouth every 8 (eight) hours. 09/03/22   Renne Crigler, PA-C  budesonide-formoterol (SYMBICORT) 160-4.5 MCG/ACT inhaler Inhale 2 puffs into the lungs 2 (two) times daily. 12/16/20   Coralyn Helling, MD  clindamycin (CLEOCIN) 300 MG capsule Take 1 capsule (300 mg total) by mouth 3 (three) times daily. 12/19/21   Benjiman Core, MD  esomeprazole (NEXIUM) 40 MG capsule Take 1 capsule (40 mg total) by mouth 2 (two) times daily before a meal. 09/02/20   Armbruster, Willaim Rayas, MD  famotidine (PEPCID) 40 MG tablet Take 1 tablet (40 mg total) by mouth at bedtime. 04/06/20   Waldon Merl, PA-C  guaiFENesin-codeine (ROBITUSSIN AC) 100-10 MG/5ML syrup Take 10 mLs by mouth 3 (three) times daily  as needed for cough. 11/08/20   Sheliah Hatch, MD  hydrochlorothiazide (HYDRODIURIL) 25 MG tablet Take 1 tablet (25 mg total) by mouth daily. 04/06/20   Waldon Merl, PA-C  levocetirizine (XYZAL) 5 MG tablet Take 1 tablet (5 mg total) by mouth every evening. 09/21/19   Waldon Merl, PA-C  mometasone (NASONEX) 50 MCG/ACT nasal spray Place into the nose. 03/29/20   [provider]  montelukast (SINGULAIR) 10 MG tablet Take 1 tablet (10 mg total) by mouth at bedtime. 12/16/20   Coralyn Helling, MD  mupirocin ointment (BACTROBAN) 2 % Apply 1 application topically 2 (two) times daily. 09/20/20   Janeece Agee, NP  NP THYROID 30 MG tablet Take 30 mg by mouth daily. 11/20/19   [provider]  ondansetron (ZOFRAN-ODT) 4 MG disintegrating tablet Take 1 tablet (4 mg total) by mouth every 8 (eight) hours as needed for nausea or vomiting. 12/19/21   Benjiman Core, MD  oxyCODONE-acetaminophen (PERCOCET/ROXICET) 5-325 MG tablet Take 1 tablet by mouth every 6 (six) hours as needed for severe pain. 12/19/21   Benjiman Core, MD  polyethylene glycol (MIRALAX) 17 g packet Take 17 g by mouth daily. 09/02/20   Armbruster, Willaim Rayas, MD  potassium chloride SA (KLOR-CON) 20 MEQ tablet Take 1 tablet (20 mEq total) by mouth daily. 04/06/20   Waldon Merl, PA-C  predniSONE (DELTASONE) 20 MG tablet Take 2 tablets (40 mg total) by mouth  daily. 09/03/22   Renne Crigler, PA-C  VITAMIN D PO Take by mouth.    [provider]      Allergies    Penicillins    Review of Systems   Review of Systems  Respiratory:  Positive for cough.     Physical Exam Updated Vital Signs BP (!) 147/66 (BP Location: Left Arm)   Pulse 70   Temp 97.6 F (36.4 C) (Oral)   Resp 20   Ht 5\' 8"  (1.727 m)   Wt 117.9 kg   LMP  (LMP Unknown)   SpO2 99%   BMI 39.53 kg/m  Physical Exam Vitals reviewed.  HENT:     Head: Normocephalic.     Right Ear: Tympanic membrane normal.     Ears:     Comments:  Erythematous left TM    Nose: Nose normal.     Mouth/Throat:     Mouth: Mucous membranes are moist.  Eyes:     Conjunctiva/sclera: Conjunctivae normal.     Pupils: Pupils are equal, round, and reactive to light.  Cardiovascular:     Rate and Rhythm: Normal rate.     Pulses: Normal pulses.  Pulmonary:     Effort: Pulmonary effort is normal. No respiratory distress.     Breath sounds: Wheezing present.  Abdominal:     General: Abdomen is flat. There is no distension.     Tenderness: There is no abdominal tenderness. There is no guarding or rebound.  Musculoskeletal:     Cervical back: Normal range of motion.     Right lower leg: No edema.     Left lower leg: No edema.  Skin:    General: Skin is warm.     Capillary Refill: Capillary refill takes less than 2 seconds.  Neurological:     Mental Status: She is alert and oriented to person, place, and time.  Psychiatric:        Mood and Affect: Mood normal.        Behavior: Behavior normal.     ED Results / Procedures / Treatments   Labs (all labs ordered are listed, but only abnormal results are displayed) Labs Reviewed  RESP PANEL BY RT-PCR (RSV, FLU A&B, COVID)  RVPGX2    EKG None  Radiology DG Chest 2 View  Result Date: 01/13/2023 CLINICAL DATA:  chest congestion/cough. Evaluate for PNA EXAM: CHEST - 2 VIEW COMPARISON:  12/19/2022. FINDINGS: Cardiac silhouette is unremarkable. No pneumothorax or pleural effusion. The lungs are clear. The visualized skeletal structures are unremarkable. IMPRESSION: No acute cardiopulmonary process. Electronically Signed   By: Layla Maw M.D.   On: 01/13/2023 10:16    Procedures Procedures    Medications Ordered in ED Medications  ipratropium-albuterol (DUONEB) 0.5-2.5 (3) MG/3ML nebulizer solution 3 mL (3 mLs Nebulization Given 01/13/23 1037)  predniSONE (DELTASONE) tablet 60 mg (60 mg Oral Given 01/13/23 1047)  ipratropium-albuterol (DUONEB) 0.5-2.5 (3) MG/3ML nebulizer solution  3 mL (3 mLs Nebulization Given 01/13/23 1044)    ED Course/ Medical Decision Making/ A&P Clinical Course as of 01/13/23 1125  Sun Jan 13, 2023  1122 Patient improved, wheezing also improved.  Maintaining saturation room air.  Also complained of some left ear pain on reevaluation, appears to have some otitis media.  Will discharge with antibiotics.  She is also out of albuterol, will order refill.  Given URI symptoms and presentation we will give short course of steroids.  Will discharge in stable condition for viral URI, otitis media  and mild asthma exacerbation. [TY]    Clinical Course User Index [TY] Coral Spikes, DO                                 Medical Decision Making Is a 47 year old female with morbid obesity and history of asthma presenting with viral URI symptoms with cough and shortness of breath.  She is afebrile nontachycardic maintaining oxygen saturation on room air.  Does not appear to be in respiratory distress, although has some reactive airway type cough while in the room as well as some diffuse expiratory wheezing.  Given DuoNeb's with improvement of her breathing and using.  Flu COVID-negative.  Chest x-ray without pneumonia.  See ED course for final MDM and disposition.  Amount and/or Complexity of Data Reviewed External Data Reviewed:     Details: Consider labs, however patient with reassuring vitals and seemingly viral URI symptoms.  Labs unlikely to change management disposition at this time. Radiology: ordered.    Details: No overt pneumonia on my independent interpretation.  Risk Prescription drug management. Decision regarding hospitalization. Risk Details: In my clinical pain he would not benefit from admission at this time.          Final Clinical Impression(s) / ED Diagnoses Final diagnoses:  None    Rx / DC Orders ED Discharge Orders     None         Coral Spikes, DO 01/13/23 1125

## 2023-02-23 ENCOUNTER — Emergency Department (HOSPITAL_BASED_OUTPATIENT_CLINIC_OR_DEPARTMENT_OTHER)
Admission: EM | Admit: 2023-02-23 | Discharge: 2023-02-23 | Discharge status: Against Medical Advice | Payer: Medicaid Other | Attending: Emergency Medicine | Admitting: Emergency Medicine

## 2023-02-23 ENCOUNTER — Other Ambulatory Visit: Payer: Self-pay

## 2023-02-23 ENCOUNTER — Emergency Department (HOSPITAL_BASED_OUTPATIENT_CLINIC_OR_DEPARTMENT_OTHER): Payer: Medicaid Other | Admitting: Radiology

## 2023-02-23 DIAGNOSIS — Z5321 Procedure and treatment not carried out due to patient leaving prior to being seen by health care provider: Secondary | ICD-10-CM | POA: Diagnosis not present

## 2023-02-23 DIAGNOSIS — Z1152 Encounter for screening for COVID-19: Secondary | ICD-10-CM | POA: Diagnosis not present

## 2023-02-23 DIAGNOSIS — R0602 Shortness of breath: Secondary | ICD-10-CM | POA: Insufficient documentation

## 2023-02-23 DIAGNOSIS — R059 Cough, unspecified: Secondary | ICD-10-CM | POA: Insufficient documentation

## 2023-02-23 LAB — RESP PANEL BY RT-PCR (RSV, FLU A&B, COVID)  RVPGX2
Influenza A by PCR: NEGATIVE
Influenza B by PCR: NEGATIVE
Resp Syncytial Virus by PCR: NEGATIVE
SARS Coronavirus 2 by RT PCR: NEGATIVE

## 2023-02-23 MED ORDER — ALBUTEROL SULFATE HFA 108 (90 BASE) MCG/ACT IN AERS
2.0000 | INHALATION_SPRAY | RESPIRATORY_TRACT | Status: DC | PRN
Start: 2023-02-23 — End: 2023-02-23

## 2023-02-23 MED ORDER — IPRATROPIUM-ALBUTEROL 0.5-2.5 (3) MG/3ML IN SOLN: 3.0000 mL | Freq: Once | RESPIRATORY_TRACT | Status: DC

## 2023-02-23 MED ORDER — ALBUTEROL SULFATE (2.5 MG/3ML) 0.083% IN NEBU: 5.0000 mg | INHALATION_SOLUTION | Freq: Once | RESPIRATORY_TRACT | Status: DC

## 2023-02-23 NOTE — ED Triage Notes (Signed)
Pt POV from home reporting increased SOB and cough for last few days, not improved with inhaler. Pt reports coughing up yellow mucus. Wheezing noted in triage

## 2023-09-18 ENCOUNTER — Ambulatory Visit: Admitting: Family Medicine

## 2023-09-20 ENCOUNTER — Encounter: Payer: Self-pay | Admitting: Student in an Organized Health Care Education/Training Program

## 2023-09-20 ENCOUNTER — Ambulatory Visit: Admitting: Student in an Organized Health Care Education/Training Program

## 2023-09-20 VITALS — BP 153/80 | HR 74 | Wt 278.0 lb

## 2023-09-20 DIAGNOSIS — M797 Fibromyalgia: Secondary | ICD-10-CM

## 2023-09-20 DIAGNOSIS — K8050 Calculus of bile duct without cholangitis or cholecystitis without obstruction: Secondary | ICD-10-CM | POA: Diagnosis not present

## 2023-09-20 DIAGNOSIS — E038 Other specified hypothyroidism: Secondary | ICD-10-CM

## 2023-09-20 DIAGNOSIS — R7303 Prediabetes: Secondary | ICD-10-CM

## 2023-09-20 DIAGNOSIS — R072 Precordial pain: Secondary | ICD-10-CM | POA: Diagnosis not present

## 2023-09-20 DIAGNOSIS — R319 Hematuria, unspecified: Secondary | ICD-10-CM | POA: Insufficient documentation

## 2023-09-20 DIAGNOSIS — E7800 Pure hypercholesterolemia, unspecified: Secondary | ICD-10-CM

## 2023-09-20 DIAGNOSIS — J4520 Mild intermittent asthma, uncomplicated: Secondary | ICD-10-CM | POA: Diagnosis not present

## 2023-09-20 DIAGNOSIS — K219 Gastro-esophageal reflux disease without esophagitis: Secondary | ICD-10-CM | POA: Diagnosis not present

## 2023-09-20 DIAGNOSIS — R300 Dysuria: Secondary | ICD-10-CM | POA: Insufficient documentation

## 2023-09-20 DIAGNOSIS — J45909 Unspecified asthma, uncomplicated: Secondary | ICD-10-CM | POA: Insufficient documentation

## 2023-09-20 DIAGNOSIS — I10 Essential (primary) hypertension: Secondary | ICD-10-CM

## 2023-09-20 LAB — POCT URINALYSIS DIPSTICK
Bilirubin, UA: NEGATIVE
Blood, UA: NEGATIVE
Glucose, UA: NEGATIVE
Ketones, UA: NEGATIVE
Nitrite, UA: NEGATIVE
Protein, UA: POSITIVE — AB
Spec Grav, UA: 1.025 (ref 1.010–1.025)
Urobilinogen, UA: 0.2 U/dL
pH, UA: 6 (ref 5.0–8.0)

## 2023-09-20 LAB — COMPREHENSIVE METABOLIC PANEL WITH GFR
ALT: 15 U/L (ref 0–35)
AST: 16 U/L (ref 0–37)
Albumin: 4.2 g/dL (ref 3.5–5.2)
Alkaline Phosphatase: 51 U/L (ref 39–117)
BUN: 11 mg/dL (ref 6–23)
CO2: 27 meq/L (ref 19–32)
Calcium: 9.4 mg/dL (ref 8.4–10.5)
Chloride: 103 meq/L (ref 96–112)
Creatinine, Ser: 0.9 mg/dL (ref 0.40–1.20)
GFR: 76.77 mL/min (ref >60.00)
Glucose, Bld: 109 mg/dL — ABNORMAL HIGH (ref 70–99)
Potassium: 3.9 meq/L (ref 3.5–5.1)
Sodium: 138 meq/L (ref 135–145)
Total Bilirubin: 0.2 mg/dL (ref 0.2–1.2)
Total Protein: 7.8 g/dL (ref 6.0–8.3)

## 2023-09-20 LAB — LIPID PANEL
Cholesterol: 187 mg/dL (ref 0–200)
HDL: 48.5 mg/dL (ref >39.00)
LDL Cholesterol: 89 mg/dL (ref 0–99)
NonHDL: 138.7
Total CHOL/HDL Ratio: 4
Triglycerides: 249 mg/dL — ABNORMAL HIGH (ref 0.0–149.0)
VLDL: 49.8 mg/dL — ABNORMAL HIGH (ref 0.0–40.0)

## 2023-09-20 LAB — HEMOGLOBIN A1C: Hgb A1c MFr Bld: 6.1 % (ref 4.6–6.5)

## 2023-09-20 LAB — TSH: TSH: 1.86 u[IU]/mL (ref 0.35–5.50)

## 2023-09-20 MED ORDER — AMLODIPINE-OLMESARTAN 5-20 MG PO TABS: 1.0000 | ORAL_TABLET | Freq: Every day | ORAL | 1 refills | Status: AC

## 2023-09-20 MED ORDER — ALBUTEROL SULFATE HFA 108 (90 BASE) MCG/ACT IN AERS: 2.0000 | INHALATION_SPRAY | Freq: Four times a day (QID) | RESPIRATORY_TRACT | 2 refills | Status: AC | PRN

## 2023-09-20 MED ORDER — OMEPRAZOLE 40 MG PO CPDR: 40.0000 mg | DELAYED_RELEASE_CAPSULE | Freq: Every day | ORAL | 3 refills | Status: AC

## 2023-09-20 MED ORDER — CETIRIZINE HCL 10 MG PO TABS: 10.0000 mg | ORAL_TABLET | Freq: Every day | ORAL | 11 refills | Status: AC

## 2023-09-20 MED ORDER — BUDESONIDE-FORMOTEROL FUMARATE 160-4.5 MCG/ACT IN AERO: 2.0000 | INHALATION_SPRAY | Freq: Two times a day (BID) | RESPIRATORY_TRACT | 6 refills | Status: AC

## 2023-09-20 MED ORDER — DULOXETINE HCL 30 MG PO CPEP: 30.0000 mg | ORAL_CAPSULE | Freq: Every day | ORAL | 1 refills | Status: AC

## 2023-09-20 NOTE — Progress Notes (Addendum)
 Established Patient Office Visit  Subjective   Patient ID: Tracy Valenzuela, female    DOB: Jul 12, 1975  Age: 48 y.o. MRN: [id number]  Chief Complaint  Patient presents with   Establish Care    Woke up with swollen left eye that started this morning and frequent urination and blood in urine. Weight concerns. Liver concerns.     HPI  48 year old person here for management of hypertension and obesity.  Is been almost 3 years since we have seen her here in the clinic.  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  Lives by herself, 3 grown children.  Reports some depressed mood recently.  She has some concerns about upper abdominal discomfort in the right upper quadrants that has been intermittent issue for many months.  Often feels it after eating, feels like an intense cramping sensation.  Sometimes it radiates up into her chest.  In addition she has a chest discomfort has been going on many years.  Sometimes feels it while she is walking, sometimes while she is watching TV.  Has been evaluated before with ischemic evaluation that was reassuring.  Went through in 10/20/2020 after her sister passed away from a heart attack when she was only in her 30s.  Patient denies any concerns with tobacco or alcohol use recently.  Not currently using any medications except for Symbicort  inhaler.  No recent asthma exacerbations.    Objective:      BP (!) 153/80   Pulse 74   Wt 278 lb (126.1 kg)   LMP  (LMP Unknown)   SpO2 99%   BMI 44.87 kg/m    Physical Exam  Gen: Well-appearing Heart: Regular, no murmur, distant sounds Lungs: Unlabored, clear throughout, no wheezing Abd: Soft, nontender, nondistended, no Murphy sign, no organomegaly Ext: Warm, no edema  Results for orders placed or performed in visit on 09/20/23  POCT urinalysis dipstick  Result Value Ref Range   Color, UA deep yellow    Clarity, UA clear    Glucose, UA Negative Negative   Bilirubin, UA negative     Ketones, UA negative    Spec Grav, UA 1.025 1.010 - 1.025   Blood, UA negative    pH, UA 6.0 5.0 - 8.0   Protein, UA Positive (A) Negative   Urobilinogen, UA 0.2 0.2 or 1.0 E.U./dL   Nitrite, UA negative    Leukocytes, UA Moderate (2+) (A) Negative   Appearance     Odor      EKG: Obtained due to an intermittent sensation of precordial chest pain.  EKG is normal sinus rhythm, left axis deviation, normal intervals, no abnormal Q waves, T wave inversions in 3 and aVF are unchanged from the October tracing, borderline ST changes in the precordial leads are also unchanged from prior.    Assessment & Plan:   Problem List Items Addressed This Visit       High   Morbid obesity (HCC)   Chronic and stable.  Weight today 278 pounds.  BMI 44.  She has tried nutrition modifications without success.  She has tried increasing exercise without success.  Obesity is complicated by obstructive sleep apnea, hyperlipidemia and hypertension.  Will check A1c today, at risk for diabetes.  She is a good candidate for GLP-1 agonist.      Fibromyalgia   Chronic and stable.  She has comorbid anxiety, chronic low back pain.  She has good understanding about fibromyalgia.  We talked about restarting Cymbalta  which she has used in the past.  We talked about reasonable expectations.      Relevant Medications   DULoxetine  (CYMBALTA ) 30 MG capsule   Essential hypertension   Chronic, asymptomatic, uncontrolled.  Blood pressure 153/80 today.  Stage II hypertension.  Will start combination amlodipine and olmesartan.  Follow-up in 4 weeks and recheck labs.      Relevant Medications   amLODipine-olmesartan (AZOR) 5-20 MG tablet   Other Relevant Orders   Comprehensive metabolic panel with GFR     Medium    Asthma   Chronic and stable.  Mild intermittent.  No recent exacerbations.  Does have issues with seasonal allergies.  Plan to continue Symbicort  twice daily and as needed albuterol  for her reliever therapy.   Will also start an antihistamine for component of allergies.      Relevant Medications   albuterol  (VENTOLIN  HFA) 108 (90 Base) MCG/ACT inhaler   budesonide -formoterol  (SYMBICORT ) 160-4.5 MCG/ACT inhaler   cetirizine (ZYRTEC) 10 MG tablet   Elevated cholesterol   Relevant Medications   amLODipine-olmesartan (AZOR) 5-20 MG tablet   Other Relevant Orders   Lipid panel   Hypothyroidism   Relevant Orders   TSH   Prediabetes   Relevant Orders   Hemoglobin A1c     Low   Chest pain   Intermittent problem for many years.  It is atypical in description.  EKG today is reassuring, unchanged from previous.  She had an ischemic evaluation in 2018 and 2019 for similar symptoms which was low risk.  I am going to do workup today for risk factors, will work to manage hypertension, hyperlipidemia, obesity, and hyperglycemia.      Relevant Orders   EKG 12-Lead   Hematuria   Patient is suspicious of seeing blood on the toilet paper, urinalysis today is without hematuria.  Low risk for vaginal bleeding status post hysterectomy.  Will monitor this.      Relevant Orders   POCT urinalysis dipstick (Completed)   Biliary colic - Primary   Recent symptoms of right upper quadrant intermittent discomfort seem most consistent with biliary colic.  She is at risk for gallstones, never had upper abdominal surgery.  Plan to get right upper quadrant ultrasound and check liver enzymes today.      Relevant Orders   US  Abdomen Limited RUQ (LIVER/GB)   Gastroesophageal reflux disease   Relevant Medications   omeprazole  (PRILOSEC) 40 MG capsule    Return in about 4 weeks (around 10/18/2023) for hypertension management.    Ether Hercules, MD

## 2023-09-20 NOTE — Assessment & Plan Note (Signed)
 Chronic, asymptomatic, uncontrolled.  Blood pressure 153/80 today.  Stage II hypertension.  Will start combination amlodipine and olmesartan.  Follow-up in 4 weeks and recheck labs.

## 2023-09-20 NOTE — Assessment & Plan Note (Signed)
 Patient is suspicious of seeing blood on the toilet paper, urinalysis today is without hematuria.  Low risk for vaginal bleeding status post hysterectomy.  Will monitor this.

## 2023-09-20 NOTE — Assessment & Plan Note (Signed)
 Chronic and stable.  Mild intermittent.  No recent exacerbations.  Does have issues with seasonal allergies.  Plan to continue Symbicort  twice daily and as needed albuterol  for her reliever therapy.  Will also start an antihistamine for component of allergies.

## 2023-09-20 NOTE — Assessment & Plan Note (Signed)
 Recent symptoms of right upper quadrant intermittent discomfort seem most consistent with biliary colic.  She is at risk for gallstones, never had upper abdominal surgery.  Plan to get right upper quadrant ultrasound and check liver enzymes today.

## 2023-09-20 NOTE — Assessment & Plan Note (Signed)
 Chronic and stable.  She has comorbid anxiety, chronic low back pain.  She has good understanding about fibromyalgia.  We talked about restarting Cymbalta  which she has used in the past.  We talked about reasonable expectations.

## 2023-09-20 NOTE — Assessment & Plan Note (Signed)
 Chronic and stable.  Weight today 278 pounds.  BMI 44.  She has tried nutrition modifications without success.  She has tried increasing exercise without success.  Obesity is complicated by obstructive sleep apnea, hyperlipidemia and hypertension.  Will check A1c today, at risk for diabetes.  She is a good candidate for GLP-1 agonist.

## 2023-09-20 NOTE — Assessment & Plan Note (Signed)
 Intermittent problem for many years.  It is atypical in description.  EKG today is reassuring, unchanged from previous.  She had an ischemic evaluation in 2018 and 2019 for similar symptoms which was low risk.  I am going to do workup today for risk factors, will work to manage hypertension, hyperlipidemia, obesity, and hyperglycemia.

## 2023-09-24 ENCOUNTER — Ambulatory Visit: Payer: Self-pay | Admitting: Student in an Organized Health Care Education/Training Program

## 2023-09-26 ENCOUNTER — Encounter: Payer: Self-pay | Admitting: Student in an Organized Health Care Education/Training Program

## 2023-09-26 NOTE — Telephone Encounter (Signed)
 I spoke with the patient by phone.  No symptoms of dysuria, low suspicion for cystitis.  I do want a follow-up the proteinuria at our next visit in June which is already scheduled.  I ordered a ultrasound of her right upper quadrant as I suspect she might have cholelithiasis.  She did hear from the scheduler, it is not scheduled yet, she is going to call tomorrow and try to schedule that for early next week.  I will call her back once I see the results.  If patient has worsening symptoms suggestive of UTI, she should come in for of visits and a urinalysis.  I answered all her questions.

## 2023-10-05 ENCOUNTER — Ambulatory Visit (HOSPITAL_BASED_OUTPATIENT_CLINIC_OR_DEPARTMENT_OTHER)

## 2023-10-15 ENCOUNTER — Encounter: Payer: Self-pay | Admitting: Student in an Organized Health Care Education/Training Program

## 2023-10-22 ENCOUNTER — Ambulatory Visit: Admitting: Student in an Organized Health Care Education/Training Program

## 2023-10-24 ENCOUNTER — Encounter: Payer: Self-pay | Admitting: Student in an Organized Health Care Education/Training Program

## 2024-01-13 ENCOUNTER — Ambulatory Visit: Payer: Self-pay

## 2024-01-13 ENCOUNTER — Encounter: Payer: Self-pay | Admitting: Student in an Organized Health Care Education/Training Program

## 2024-01-13 NOTE — Telephone Encounter (Signed)
 Patient states her stools are oily with yellow and orange coloring. States she also has noticed worms in her stools as well. Patient is being seen tomorrow and would like to be tested for parasitic infection. She believes this may be the cause to her coughing as well.

## 2024-01-13 NOTE — Telephone Encounter (Signed)
 Should patient wait for appointment Thursday or be seen sooner?

## 2024-01-13 NOTE — Telephone Encounter (Signed)
 Patient is seeing you on 01/15/24

## 2024-01-13 NOTE — Telephone Encounter (Signed)
 FYI Only or Action Required?: FYI only for provider.  Patient was last seen in primary care on 09/20/2023 by Jerrell Cleatus Ned, MD.  Called Nurse Triage reporting Vaginal Bleeding.  Symptoms began today.  Interventions attempted: Nothing.  Symptoms are: stable.  Triage Disposition: See PCP Within 2 Weeks  Patient/caregiver understands and will follow disposition?: Yes   Copied from CRM [id number]. Topic: Clinical - Red Word Triage >> Jan 13, 2024 11:41 AM Robinson H wrote: [facility] that prompted transfer to Nurse Triage: Bleeding when urinating pulling from belly button to vagina, vomited looked like lining of throat, had hysterectomy so shouldn't be bleeding, cough and nasal drainage as well Reason for Disposition  All other vaginal symptoms  (Exceptions: Feels like prior yeast infection, minor abrasion, mild rash < 24 hour duration, mild itching, vaginal dryness during sex.)  Answer Assessment - Initial Assessment Questions Patient says she wiped this morning after urinating and blood was on the tissue. She says no blood in the urine. She says there is a pulling feeling that she feels to her abdomen. She says her abdomen is swollen and tender to touch on the right side when pressed. Advised first available on Wednesday 9/17 with PCP, she agrees to that appointment.  1. SYMPTOM: What's the main symptom you're concerned about? (e.g., pain, itching, dryness)     Bleeding  3. ONSET: When did the bleeding  start?     Today  4. PAIN: Is there any pain? If Yes, ask: How bad is it? (Scale: 1-10; mild, moderate, severe)     5  5. ITCHING: Is there any itching? If Yes, ask: How bad is it? (Scale: 1-10; mild, moderate, severe)     No  6. CAUSE: What do you think is causing the discharge? Have you had the same problem before? What happened then?     Unsure about the bleeding  7. OTHER SYMPTOMS: Do you have any other symptoms? (e.g., fever, itching, vaginal  bleeding, pain with urination, injury to genital area, vaginal foreign body)     Pressure with urinating since had hysterectomy, cough, nasal drainage, vomiting white and grey colored with white looking like a tissue from body, abdomen swollen and tender on the right side  Protocols used: Vaginal Symptoms-A-AH

## 2024-01-14 NOTE — Telephone Encounter (Signed)
 Patient has an appointment with me tomorrow, Wednesday.  Will evaluate this problem during that visit.

## 2024-01-15 ENCOUNTER — Encounter: Payer: Self-pay | Admitting: Student in an Organized Health Care Education/Training Program

## 2024-01-15 ENCOUNTER — Telehealth: Payer: Self-pay

## 2024-01-15 ENCOUNTER — Ambulatory Visit: Admitting: Student in an Organized Health Care Education/Training Program

## 2024-01-15 VITALS — BP 166/88 | HR 71 | Wt 276.0 lb

## 2024-01-15 DIAGNOSIS — R197 Diarrhea, unspecified: Secondary | ICD-10-CM | POA: Diagnosis not present

## 2024-01-15 NOTE — Patient Instructions (Signed)
  VISIT SUMMARY: During your visit, we discussed your gastrointestinal symptoms, including diarrhea and abdominal pain, as well as your headache and persistent cough. We reviewed your medical history, including a past Clostridioides difficile infection, and considered your recent travel and work environment as potential factors.  YOUR PLAN: -DIARRHEA WITH ABDOMINAL PAIN AND NAUSEA: Your symptoms may be due to a recurrent Clostridioides difficile infection, which is a bacterial infection that can cause severe diarrhea and abdominal pain. We will collect a stool sample to test for C. difficile, parasites, and other gastrointestinal pathogens. In the meantime, please stay hydrated.  -HEADACHE: You are experiencing a constant, migraine-like headache in a specific area. We will monitor this symptom and consider further evaluation if it persists.  -PERSISTENT COUGH: Your lingering cough is likely a residual effect of a recent sinus infection and community head cold. We will continue to monitor this symptom.  INSTRUCTIONS: Please provide a stool sample for testing today or tomorrow. We expect to have the results within a day after submission. Follow up with us  to discuss the stool test results.

## 2024-01-15 NOTE — Progress Notes (Signed)
   Acute Office Visit  Subjective:     Patient ID: Tracy Valenzuela, female    DOB: February 19, 1976, 48 y.o.   MRN: [id number]  Chief Complaint  Patient presents with   Vaginal Bleeding    Triage notes in chart for vaginal bleeding     HPI  Discussed the use of AI scribe software for clinical note transcription with the patient, who gave verbal consent to proceed.  History of Present Illness Tracy Valenzuela is a 48 year old female who presents with gastrointestinal symptoms including diarrhea and abdominal pain.  She has been experiencing gastrointestinal symptoms since Tuesday, characterized by diarrhea with frequent bowel movements, approximately six to seven times yesterday and five times within a two-hour span this morning. The stools are described as 'orange, yellow, oily' with 'white little grains' and 'looked like worms'.  She has a history of Clostridioides difficile infection approximately four to five years ago, for which she was treated with azithromycin and vancomycin . She recalls that the hospital prescribed the medication for too long, affecting her gut flora.  She experiences constant abdominal pain, particularly on the right side. She also reports nausea and a lack of appetite, stating that eating causes significant stomach pain and sometimes leads to vomiting. Despite this, she is maintaining hydration.  No recent antibiotic use or fever is reported, but she mentions a persistent headache localized to a specific area of her head. She also reports a lingering cough following a community-wide head cold and sinus infection, which she treated with herbal medicine.  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      Objective:    BP (!) 166/88   Pulse 71   Wt 276 lb (125.2 kg)   LMP  (LMP Unknown)   SpO2 96%   BMI 44.55 kg/m   Physical Exam  Gen: Well-appearing  woman Mouth: Moist mucous membranes Abd: Soft, mild tenderness to palpation in the right lower and upper quadrants, no rebound tenderness, no peritoneal signs Ext: Warm, well-perfused, no edema, normal skin turgor      Assessment & Plan:    Problem List Items Addressed This Visit       High   Acute diarrhea - Primary   1 week of acute diarrhea with modest abdominal discomfort, about 6 loose bowel movements per day.  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 difficile colitis in the past.  No recent exposure to antibiotics.  No recent travel.  Overall looks comfortable and hydrated, her abdominal discomfort is reasonable.  I do not think this is a severe infectious diarrhea.  Will check stool sample for C. 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  I recommended staying home out of work until we are able to evaluate for infectious diarrhea      Relevant Orders   C difficile Toxins A+B W/Rflx   GI Profile, Stool, PCR    Cleatus Debby Specking, MD

## 2024-01-15 NOTE — Assessment & Plan Note (Signed)
 1 week of acute diarrhea with modest abdominal discomfort, about 6 loose bowel movements per day.  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 difficile colitis in the past.  No recent exposure to antibiotics.  No recent travel.  Overall looks comfortable and hydrated, her abdominal discomfort is reasonable.  I do not think this is a severe infectious diarrhea.  Will check stool sample for C. 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  I recommended staying home out of work until we are able to evaluate for infectious diarrhea

## 2024-01-15 NOTE — Telephone Encounter (Signed)
 Copied from CRM [id number]. Topic: General - Running Late >> Jan 15, 2024 11:17 AM Sophia H wrote: Patient/patient representative is calling because they are running late for an appointment.    Running 5 mins late - advised 10 min grace. Jan 15 2024 11:20 AM - Office Visit [facility] - Cleatus Debby Specking, MD

## 2024-01-15 NOTE — Telephone Encounter (Signed)
 Patient arrived at appt 11 minutes after appt. FYI

## 2024-01-16 DIAGNOSIS — R197 Diarrhea, unspecified: Secondary | ICD-10-CM | POA: Diagnosis not present

## 2024-01-17 ENCOUNTER — Telehealth: Payer: Self-pay

## 2024-01-17 LAB — GI PROFILE, STOOL, PCR

## 2024-01-17 NOTE — Telephone Encounter (Signed)
 Copied from CRM #[id number]. Topic: Clinical - Lab/Test Results >> Jan 17, 2024  4:12 PM Martinique E wrote: Reason for CRM: Patient called wanting to go over stool sample results. Callback number [phone].

## 2024-01-20 LAB — C DIFFICILE TOXINS A+B W/RFLX: C difficile Toxins A+B, EIA: NEGATIVE

## 2024-01-20 LAB — C DIFFICILE, CYTOTOXIN B

## 2024-01-20 NOTE — Telephone Encounter (Signed)
 I spoke with the patient by phone.  Stool studies are reassuring.  No signs of infectious diarrhea.  Her symptoms have resolved.  Seems to have been a self-limited issue.  She has been able to return back to work safely.  No follow-up needed.

## 2024-01-20 NOTE — Telephone Encounter (Signed)
 Looks like results are back. Provider has not reviewed. Please advise, thank you

## 2024-02-12 ENCOUNTER — Encounter: Payer: Self-pay | Admitting: Student in an Organized Health Care Education/Training Program

## 2024-02-12 ENCOUNTER — Ambulatory Visit: Admitting: Student in an Organized Health Care Education/Training Program

## 2024-04-14 ENCOUNTER — Ambulatory Visit: Payer: Self-pay

## 2024-04-14 NOTE — Telephone Encounter (Signed)
 FYI Only or Action Required?: FYI only for provider: appointment scheduled on 04/17/2024.  Patient was last seen in primary care on 01/15/2024 by Jerrell Cleatus Ned, MD.  Called Nurse Triage reporting Breast Pain and Groin Swelling.  Symptoms began a week ago.  Interventions attempted: Nothing.  Symptoms are: unchanged.  Triage Disposition: See PCP When Office is Open (Within 3 Days)  Patient/caregiver understands and will follow disposition?: Yes  Copied from CRM #[id number]. Topic: Clinical - Red Word Triage >> Apr 14, 2024  2:15 PM Eva FALCON wrote: Red Word that prompted transfer to Nurse Triage:  sharp pain on left breast for about a week, could feel veins around areolas. swelling in vagina area. Reason for Disposition  Tender lump (swelling or ball) at vaginal opening  [1] Breast pain AND [2] cause is not known  Answer Assessment - Initial Assessment Questions 1. SYMPTOM: What's the main symptom you're concerned about?  (e.g., lump, nipple discharge, pain, rash)     Breast pain and swelling 2. LOCATION: Where is the pain located?     Areolar pain and veins under breast leading to areola are painful 3. ONSET: When did pain  start?     One week ago 4. PRIOR HISTORY: Do you have any history of prior problems with your breasts? (e.g., breast cancer, breast implant, fibrocystic breast disease)     denies 5. CAUSE: What do you think is causing this symptom?     denies 6. OTHER SYMPTOMS: Do you have any other symptoms? (e.g., breast pain, fever, nipple discharge, redness or rash)    Vaginal swelling 7. PREGNANCY-BREASTFEEDING: Is there any chance you are pregnant? When was your last menstrual period? Are you breastfeeding?    Hysterectomy 2018  Answer Assessment - Initial Assessment Questions 1. SYMPTOM: What's the main symptom you're concerned about? (e.g., pain, itching, dryness)     swelling 2. LOCATION: Where is the  swelling located? (e.g.,  inside/outside, left/right)     outside 3. ONSET: When did the  swelling  start?     On and off since hysterectomy in 2018  4. PAIN: Is there any pain? If Yes, ask: How bad is it? (Scale: 1-10; mild, moderate, severe)     6/10 5. ITCHING: Is there any itching? If Yes, ask: How bad is it? (Scale: 1-10; mild, moderate, severe)     denies 6. CAUSE: What do you think is causing the discharge? Have you had the same problem before? What happened then?     unsure 7. OTHER SYMPTOMS: Do you have any other symptoms? (e.g., fever, itching, vaginal bleeding, pain with urination, injury to genital area, vaginal foreign body)     Slow urine stream.  Can feel a bulge, but nothing protrudes  Protocols used: Breast Symptoms-A-AH, Vaginal Symptoms-A-AH

## 2024-04-17 ENCOUNTER — Encounter: Payer: Self-pay | Admitting: Student in an Organized Health Care Education/Training Program

## 2024-04-17 ENCOUNTER — Other Ambulatory Visit (HOSPITAL_COMMUNITY)
Admission: RE | Admit: 2024-04-17 | Discharge: 2024-04-17 | Disposition: A | Discharge status: Home / Self Care | Source: Ambulatory Visit | Attending: Student in an Organized Health Care Education/Training Program | Admitting: Student in an Organized Health Care Education/Training Program

## 2024-04-17 ENCOUNTER — Ambulatory Visit: Admitting: Student in an Organized Health Care Education/Training Program

## 2024-04-17 VITALS — BP 152/64 | HR 66 | Wt 278.0 lb

## 2024-04-17 DIAGNOSIS — N644 Mastodynia: Secondary | ICD-10-CM | POA: Diagnosis not present

## 2024-04-17 DIAGNOSIS — N898 Other specified noninflammatory disorders of vagina: Secondary | ICD-10-CM | POA: Insufficient documentation

## 2024-04-17 DIAGNOSIS — R300 Dysuria: Secondary | ICD-10-CM | POA: Diagnosis not present

## 2024-04-17 LAB — URINALYSIS, ROUTINE W REFLEX MICROSCOPIC
Bilirubin Urine: NEGATIVE
Ketones, ur: NEGATIVE
Leukocytes,Ua: NEGATIVE
Nitrite: NEGATIVE
Specific Gravity, Urine: 1.025 (ref 1.000–1.030)
Total Protein, Urine: 30 — AB
Urine Glucose: NEGATIVE
Urobilinogen, UA: 1 (ref 0.0–1.0)
pH: 6 (ref 5.0–8.0)

## 2024-04-17 LAB — POCT URINALYSIS DIPSTICK
Bilirubin, UA: NEGATIVE
Glucose, UA: NEGATIVE
Ketones, UA: NEGATIVE
Leukocytes, UA: NEGATIVE
Nitrite, UA: NEGATIVE
Protein, UA: POSITIVE — AB
Spec Grav, UA: 1.025
Urobilinogen, UA: 0.2 U/dL
pH, UA: 6

## 2024-04-17 NOTE — Assessment & Plan Note (Signed)
 History of swelling of the labia and intermittent bleeding, exam is reassuring today.  No signs of Bartholin gland cyst, cellulitis, or other erosions to explain her symptoms.  She had a surgical hysterectomy, absent cervix.  Sexually active with 1 partner.  We swabbed for GC, chlamydia, trichomonas, and Candida.

## 2024-04-17 NOTE — Progress Notes (Signed)
 "  Acute Office Visit  Patient ID: Tracy Valenzuela, female    DOB: 1975/09/29, 48 y.o.   MRN: [id number]  PCP: Jerrell Cleatus Ned, MD  Chief Complaint  Patient presents with   Breast Pain    sharp pain on left breast for about a week, could feel veins around areolas. swelling in vagina area.    Subjective:     HPI  Discussed the use of AI scribe software for clinical note transcription with the patient, who gave verbal consent to proceed.  History of Present Illness Tracy Valenzuela is a 48 year old female who presents with vaginal bleeding and breast pain.  She has been experiencing vaginal bleeding and a sensation of swelling in the vaginal area since Monday. There is difficulty urinating, described as feeling like 'something is stuck', accompanied by significant discomfort and pain. She has a history of hysterectomy, making the source of the bleeding unclear. No vaginal discharge is noted, but she describes waking up with swelling and later experiencing bleeding. Painful urination is confirmed, with blood noted when wiping after urination. Abdominal and back pain are also present.  She reports sharp, alternating breast pain that began on Wednesday, which has raised her concern. The pain is localized around the nipple. No recent changes in sexual partners and confirms recent intercourse.      Objective:    BP (!) 152/64   Pulse 66   Wt 278 lb (126.1 kg)   LMP  (LMP Unknown)   SpO2 98%   BMI 44.87 kg/m   Physical Exam  Gen: Well-appearing woman Breast: High density tissue, no skin changes, no axillary adenopathy, no palpated masses in the breast tissue, normal areola, mild tenderness to palpation diffusely Abd: Soft and nontender Vaginal: Normal labia, no cysts or erythema, normal vaginal walls with no erosions, vesicles, or bleeding, surgically absent cervix, small amount of vaginal discharge was swabbed    Results for orders placed or performed in visit on 04/17/24   POCT Urinalysis Dipstick  Result Value Ref Range   Color, UA Deep Yellow    Clarity, UA cloudy    Glucose, UA Negative Negative   Bilirubin, UA n    Ketones, UA n    Spec Grav, UA 1.025 1.010 - 1.025   Blood, UA 3+    pH, UA 6.0 5.0 - 8.0   Protein, UA Positive (A) Negative   Urobilinogen, UA 0.2 0.2 or 1.0 E.U./dL   Nitrite, UA negative    Leukocytes, UA Negative Negative   Appearance     Odor         Assessment & Plan:   Problem List Items Addressed This Visit       Low   Dysuria   She experiences painful urination with blood on wiping. Urinalysis reveals blood and protein, but no nitrites or leukocytes, suggesting a possible vaginal source of bleeding. A urine culture and full urinalysis are ordered.  Will need to follow-up on the hematuria, perhaps nephrolithiasis could have explained her discomfort and hematuria.      Relevant Orders   POCT Urinalysis Dipstick (Completed)   Urine Culture   Urinalysis, Routine w reflex microscopic     Unprioritized   Vaginal irritation - Primary   History of swelling of the labia and intermittent bleeding, exam is reassuring today.  No signs of Bartholin gland cyst, cellulitis, or other erosions to explain her symptoms.  She had a surgical hysterectomy, absent cervix.  Sexually active with 1 partner.  We swabbed for GC, chlamydia, trichomonas, and Candida.      Relevant Orders   Cervicovaginal ancillary only   Breast tenderness in female   She reports sharp, alternating breast pain with tenderness around the nipple. No immediate findings are noted on examination. A mammogram is ordered.      Relevant Orders   MM Digital Diagnostic Bilat    Return if symptoms worsen or fail to improve.  Cleatus Debby Specking, MD [facility]   "

## 2024-04-17 NOTE — Assessment & Plan Note (Signed)
 She experiences painful urination with blood on wiping. Urinalysis reveals blood and protein, but no nitrites or leukocytes, suggesting a possible vaginal source of bleeding. A urine culture and full urinalysis are ordered.  Will need to follow-up on the hematuria, perhaps nephrolithiasis could have explained her discomfort and hematuria.

## 2024-04-17 NOTE — Patient Instructions (Signed)
" °  VISIT SUMMARY: Today, you came in with concerns about vaginal bleeding, swelling, and breast pain. We discussed your symptoms in detail and have ordered several tests to determine the cause of your discomfort.  YOUR PLAN: -ACUTE VAGINITIS: Acute vaginitis is an inflammation of the vagina that can cause symptoms like swelling, pain, and bleeding. We have ordered a vaginal swab to test for infections such as yeast, gonorrhea, chlamydia, and trichomonas. We will wait for the results before starting any antibiotics.  -DYSURIA AND HEMATURIA: Dysuria means painful urination, and hematuria means blood in the urine. Your urinalysis showed blood and protein, which might be coming from the vagina. We have ordered a urine culture and a full urinalysis to investigate further.  -BREAST PAIN: Breast pain can have various causes, including hormonal changes or infections. Since you have sharp pain around the nipple, we have ordered a mammogram to get a clearer picture of what might be causing your symptoms.  INSTRUCTIONS: Please follow up with us  once the results of your tests are available. If your symptoms worsen or you experience new symptoms, contact us  immediately.   "

## 2024-04-17 NOTE — Assessment & Plan Note (Signed)
 She reports sharp, alternating breast pain with tenderness around the nipple. No immediate findings are noted on examination. A mammogram is ordered.

## 2024-04-18 LAB — URINE CULTURE
MICRO NUMBER:: 17379260
SPECIMEN QUALITY:: ADEQUATE

## 2024-04-20 ENCOUNTER — Ambulatory Visit: Payer: Self-pay | Admitting: Student in an Organized Health Care Education/Training Program

## 2024-04-20 DIAGNOSIS — D699 Hemorrhagic condition, unspecified: Secondary | ICD-10-CM

## 2024-04-20 LAB — CERVICOVAGINAL ANCILLARY ONLY
Bacterial Vaginitis (gardnerella): POSITIVE — AB
Candida Glabrata: NEGATIVE
Candida Vaginitis: NEGATIVE
Chlamydia: NEGATIVE
Comment: NEGATIVE
Comment: NEGATIVE
Comment: NEGATIVE
Comment: NEGATIVE
Comment: NEGATIVE
Comment: NORMAL
Neisseria Gonorrhea: NEGATIVE
Trichomonas: POSITIVE — AB

## 2024-04-20 NOTE — Telephone Encounter (Signed)
Patient's response to [employer].

## 2024-04-21 MED ORDER — METRONIDAZOLE 500 MG PO TABS: 500.0000 mg | ORAL_TABLET | Freq: Two times a day (BID) | ORAL | 0 refills | Status: AC

## 2024-04-21 NOTE — Telephone Encounter (Signed)
 Please call the patient to schedule lab visit only. We will need to rule out a bleeding disorder causing her unusual vaginal bleeding post-hysterectomy. Future orders are in. Thank you.

## 2024-04-21 NOTE — Telephone Encounter (Signed)
Patient scheduled lab appt.

## 2024-04-21 NOTE — Telephone Encounter (Signed)
 Called patient to schedule lab only visit, Left Vm to return call   If patient returns call please schedule for lab only visit

## 2024-04-24 ENCOUNTER — Other Ambulatory Visit

## 2024-05-14 ENCOUNTER — Ambulatory Visit: Admitting: Student in an Organized Health Care Education/Training Program

## 2024-05-14 ENCOUNTER — Encounter: Payer: Self-pay | Admitting: Student in an Organized Health Care Education/Training Program

## 2024-05-14 VITALS — BP 147/76 | HR 80 | Temp 98.3°F | Ht 66.0 in | Wt 279.0 lb

## 2024-05-14 DIAGNOSIS — D699 Hemorrhagic condition, unspecified: Secondary | ICD-10-CM | POA: Diagnosis not present

## 2024-05-14 DIAGNOSIS — J0100 Acute maxillary sinusitis, unspecified: Secondary | ICD-10-CM

## 2024-05-14 LAB — PROTIME-INR
INR: 1 ratio (ref 0.8–1.0)
Prothrombin Time: 10.6 s (ref 9.6–13.1)

## 2024-05-14 LAB — COMPREHENSIVE METABOLIC PANEL WITH GFR
ALT: 12 U/L (ref 3–35)
AST: 13 U/L (ref 5–37)
Albumin: 4 g/dL (ref 3.5–5.2)
Alkaline Phosphatase: 57 U/L (ref 39–117)
BUN: 9 mg/dL (ref 6–23)
CO2: 28 meq/L (ref 19–32)
Calcium: 8.9 mg/dL (ref 8.4–10.5)
Chloride: 101 meq/L (ref 96–112)
Creatinine, Ser: 0.83 mg/dL (ref 0.40–1.20)
GFR: 84.22 mL/min
Glucose, Bld: 118 mg/dL — ABNORMAL HIGH (ref 70–99)
Potassium: 3.7 meq/L (ref 3.5–5.1)
Sodium: 135 meq/L (ref 135–145)
Total Bilirubin: 0.3 mg/dL (ref 0.2–1.2)
Total Protein: 7.8 g/dL (ref 6.0–8.3)

## 2024-05-14 LAB — CBC
HCT: 35.6 % — ABNORMAL LOW (ref 36.0–46.0)
Hemoglobin: 12 g/dL (ref 12.0–15.0)
MCHC: 33.8 g/dL (ref 30.0–36.0)
MCV: 86.1 fl (ref 78.0–100.0)
Platelets: 337 K/uL (ref 150.0–400.0)
RBC: 4.13 Mil/uL (ref 3.87–5.11)
RDW: 13 % (ref 11.5–15.5)
WBC: 5.5 K/uL (ref 4.0–10.5)

## 2024-05-14 MED ORDER — FLUTICASONE PROPIONATE 50 MCG/ACT NA SUSP: 1.0000 | Freq: Every day | NASAL | 2 refills | Status: AC

## 2024-05-14 MED ORDER — DOXYCYCLINE HYCLATE 100 MG PO TABS: 100.0000 mg | ORAL_TABLET | Freq: Two times a day (BID) | ORAL | 0 refills | Status: AC

## 2024-05-14 NOTE — Assessment & Plan Note (Signed)
 Symptoms most consistent with an acute sinusitis.  She goes through a couple of these every year she tells me.  We talked about sinus irrigation and supportive care with intranasal steroids.  She is accustomed to using antibiotics to shorten the course of the discomfort which I think is reasonable.  She has an allergy to penicillin so I prescribed doxycycline  to be used twice daily for 7 days.  She is accustomed to getting prednisone  for sinusitis but I recommended against that because of high risk of side effects including worsening weight gain, mood disorder, sleep disorder.  I encouraged the intranasal steroid as well as the sinus irrigation, and supportive care with NSAIDs and rest for discomfort.

## 2024-05-14 NOTE — Progress Notes (Signed)
 "  Acute Office Visit  Patient ID: Tracy Valenzuela, female    DOB: Sep 19, 1975, 49 y.o.   MRN: [id number]  PCP: Jerrell Cleatus Ned, MD  Chief Complaint  Patient presents with   Sinus Problem    Light rash on face and pressure above eyes. Started probably a couple days ago.     Subjective:     HPI  Discussed the use of AI scribe software for clinical note transcription with the patient, who gave verbal consent to proceed.  History of Present Illness Tracy Valenzuela is a 49 year old female who presents with symptoms suggestive of a sinus infection.  She has been experiencing sinus pressure, a headache on the side of her head, and nasal drainage for a couple of days. Initially, her face was swollen, followed by the development of a light rash, and then the headache began. She describes a sensation of air coming out of her eyes when she blows her nose. No fever, although she felt 'a little warm' earlier but did not check her temperature as she was at work.  She has a history of recurrent sinus infections and states, 'I keep them.' She is currently taking Zyrtec  for her sinuses and uses a sinus rinse, although she notes that the rinse is not helping. In the past, she has been treated with antibiotics and prednisone  for similar symptoms. She recalls being prescribed doxycycline  previously, as she is allergic to penicillin.  Despite her symptoms, she continues to work. 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      Objective:    BP (!) 147/76   Pulse 80   Temp 98.3 F (36.8 C) (Oral)   Ht 5' 6 (1.676 m)   Wt 279 lb (126.6 kg)   LMP  (LMP Unknown)   SpO2 98%   BMI 45.03 kg/m   Physical Exam  Gen: Well-appearing woman Ears: Bilateral tympanic membranes appear normal, no middle ear effusion Face: Tenderness to palpation of bilateral maxillary sinuses Mouth: No oral lesions or erythema in the posterior  oropharynx Neck: No tender cervical adenopathy Heart: Regular, no murmur Lungs: Unlabored, clear throughout, no crackles.     Assessment & Plan:   Problem List Items Addressed This Visit       Unprioritized   Acute sinusitis - Primary   Symptoms most consistent with an acute sinusitis.  She goes through a couple of these every year she tells me.  We talked about sinus irrigation and supportive care with intranasal steroids.  She is accustomed to using antibiotics to shorten the course of the discomfort which I think is reasonable.  She has an allergy to penicillin so I prescribed doxycycline  to be used twice daily for 7 days.  She is accustomed to getting prednisone  for sinusitis but I recommended against that because of high risk of side effects including worsening weight gain, mood disorder, sleep disorder.  I encouraged the intranasal steroid as well as the sinus irrigation, and supportive care with NSAIDs and rest for discomfort.      Relevant Medications   doxycycline  (VIBRA -TABS) 100 MG tablet   fluticasone  (FLONASE  ALLERGY RELIEF) 50 MCG/ACT nasal spray    Meds ordered this encounter  Medications   doxycycline  (VIBRA -TABS) 100 MG tablet    Sig: Take 1 tablet (100 mg total) by mouth 2 (two) times daily for 7 days.    Dispense:  14 tablet    Refill:  0   fluticasone  (FLONASE  ALLERGY RELIEF) 50 MCG/ACT nasal spray    Sig: Place 1 spray into both nostrils daily.    Dispense:  16 g    Refill:  2    Return if symptoms worsen or fail to improve.  Cleatus Debby Specking, MD [facility]   "

## 2024-05-14 NOTE — Patient Instructions (Signed)
" °  VISIT SUMMARY: Today, you were seen for symptoms suggestive of a sinus infection, including sinus pressure, headache, nasal drainage, and a light rash. You have a history of recurrent sinus infections and are currently taking Zyrtec  and using a sinus rinse. You also discussed weight management options.  YOUR PLAN: -ACUTE SINUSITIS: Acute sinusitis is an infection of the sinuses that can cause symptoms like sinus pressure, headache, and nasal drainage. You have been prescribed doxycycline  and Flonase  to help treat the infection and reduce inflammation. It is recommended that you continue using a daily sinus rinse. Prednisone  was not recommended due to its potential side effects.  -OBESITY: Obesity is a condition characterized by excessive body weight. We discussed management options, including medications that may be covered by your new insurance, [address] and Blue Shield. We will evaluate your coverage for these medications once your new insurance is active.  INSTRUCTIONS: Please take the prescribed doxycycline  and Flonase  as directed. Continue using a daily sinus rinse. Follow up with us  once your [employer] and [employer] is active to discuss weight management options.   "

## 2024-05-15 ENCOUNTER — Ambulatory Visit: Payer: Self-pay | Admitting: Student in an Organized Health Care Education/Training Program

## 2024-05-15 DIAGNOSIS — R791 Abnormal coagulation profile: Secondary | ICD-10-CM

## 2024-05-15 NOTE — Progress Notes (Signed)
 Patient scheduled lab visit

## 2024-05-15 NOTE — Progress Notes (Signed)
 Tried to call patient to schedule a lab only visit. Unable to leave vm to return call. If patient calls back, please schedule a lab only visit.

## 2024-05-18 ENCOUNTER — Other Ambulatory Visit

## 2024-05-18 DIAGNOSIS — R791 Abnormal coagulation profile: Secondary | ICD-10-CM

## 2024-05-20 LAB — PTT FACTOR INHIBITOR (MIXING STUDY)

## 2024-05-21 ENCOUNTER — Encounter: Payer: Self-pay | Admitting: Student in an Organized Health Care Education/Training Program

## 2024-05-21 LAB — PTT FACTOR INHIBITOR (MIXING STUDY)
aPTT 1:1 Normal Plasma: 24 s (ref 22.9–30.2)
aPTT: 25 s (ref 22.9–30.2)

## 2024-05-21 LAB — VON WILLEBRAND PANEL
Factor VIII Activity: 157 % — ABNORMAL HIGH (ref 56–140)
Von Willebrand Ag: 102 % (ref 50–200)
Von Willebrand Factor: 100 % (ref 50–200)

## 2024-05-21 LAB — LUPUS ANTICOAGULANT PANEL
Dilute Viper Venom Time: 25.6 s (ref 0.0–47.0)
PTT Lupus Anticoagulant: 33.8 s (ref 0.0–43.5)

## 2024-05-21 LAB — COAG STUDIES INTERP REPORT

## 2024-05-22 ENCOUNTER — Ambulatory Visit: Payer: Self-pay | Admitting: Student in an Organized Health Care Education/Training Program

## 2024-05-28 ENCOUNTER — Encounter: Payer: Self-pay | Admitting: Student in an Organized Health Care Education/Training Program

## 2024-05-28 ENCOUNTER — Ambulatory Visit (INDEPENDENT_AMBULATORY_CARE_PROVIDER_SITE_OTHER): Admitting: Student in an Organized Health Care Education/Training Program

## 2024-05-28 ENCOUNTER — Other Ambulatory Visit: Payer: Self-pay | Admitting: Student in an Organized Health Care Education/Training Program

## 2024-05-28 DIAGNOSIS — Z23 Encounter for immunization: Secondary | ICD-10-CM | POA: Diagnosis not present

## 2024-05-28 DIAGNOSIS — Z1239 Encounter for other screening for malignant neoplasm of breast: Secondary | ICD-10-CM

## 2024-05-28 DIAGNOSIS — Z6841 Body Mass Index (BMI) 40.0 and over, adult: Secondary | ICD-10-CM | POA: Diagnosis not present

## 2024-05-28 DIAGNOSIS — R7303 Prediabetes: Secondary | ICD-10-CM

## 2024-05-28 DIAGNOSIS — N644 Mastodynia: Secondary | ICD-10-CM

## 2024-05-28 DIAGNOSIS — I10 Essential (primary) hypertension: Secondary | ICD-10-CM | POA: Diagnosis not present

## 2024-05-28 DIAGNOSIS — E038 Other specified hypothyroidism: Secondary | ICD-10-CM | POA: Diagnosis not present

## 2024-05-28 DIAGNOSIS — Z1211 Encounter for screening for malignant neoplasm of colon: Secondary | ICD-10-CM

## 2024-05-28 LAB — POCT GLYCOSYLATED HEMOGLOBIN (HGB A1C): HbA1c, POC (controlled diabetic range): 6 % (ref 0.0–7.0)

## 2024-05-28 LAB — APTT: aPTT: 79.3 s — ABNORMAL HIGH (ref 25.4–36.8)

## 2024-05-28 MED ORDER — WEGOVY 0.25 MG/0.5ML ~~LOC~~ SOAJ: 0.2500 mg | SUBCUTANEOUS | 2 refills | Status: AC

## 2024-05-28 NOTE — Telephone Encounter (Signed)
 Please advise.

## 2024-05-28 NOTE — Assessment & Plan Note (Signed)
 There is a family history of thyroid  disorders, most likely goiter based on her description, and her thyroid  is slightly enlarged. A previous CT in 2019 of the neck showed a normal thyroid .  Confirm family history due to potential risk with GLP-1 agonist.

## 2024-05-28 NOTE — Progress Notes (Signed)
 "  Established Patient Office Visit  Patient ID: Tracy Valenzuela, female    DOB: 03-23-1976  Age: 49 y.o. MRN: [id number] PCP: Jerrell Cleatus Ned, MD  Chief Complaint  Patient presents with   Weight Loss    Subjective:     HPI  Discussed the use of AI scribe software for clinical note transcription with the patient, who gave verbal consent to proceed.  History of Present Illness Tracy Valenzuela is a 49 year old female with prediabetes who presents with concerns about weight gain.  She has experienced a steady increase in weight, currently at 283.4 pounds, up from 276 pounds in September. Despite no changes in diet or activity level, her weight has been rising. She uses the MyFitnessPal app to track her calorie intake, which is approximately 2000 calories per day, although her goal is 1500 calories. She often forgets to eat, which may contribute to her weight issues.  She has a history of prediabetes and is concerned about her glucose levels, which were noted to be high in recent lab results. Previous blood work showed a falsely high glucose reading, but subsequent tests were normal. She is due for an A1c test, as it has been over six months since the last one.  No recent vaginal bleeding, which she experienced previously. This issue has resolved, and she has not had any further episodes.  In her family history, her mother had thyroid  cancer, and her great-great-grandmother had a goiter. She is unsure of the specific type of thyroid  cancer her mother had but plans to find out.     Objective:     BP (!) 149/97   Pulse (!) 53   Temp 98.5 F (36.9 C) (Oral)   Ht 5' 7 (1.702 m)   Wt 283 lb 6.4 oz (128.5 kg)   LMP  (LMP Unknown)   SpO2 96%   BMI 44.39 kg/m   Physical Exam  Gen: Well-appearing woman Neck: Mildly enlarged thyroid  diffusely, no nodules Heart: Regular, no murmur Lungs: Unlabored, clear throughout Ext: Warm, no edema    Results for orders placed or  performed in visit on 05/28/24  POCT glycosylated hemoglobin (Hb A1C)  Result Value Ref Range   Hemoglobin A1C     HbA1c POC (<> result, manual entry)     HbA1c, POC (prediabetic range)     HbA1c, POC (controlled diabetic range) 6.0 0.0 - 7.0 %      Assessment & Plan:   Problem List Items Addressed This Visit       High   Morbid obesity (HCC) - Primary (Chronic)   Chronic and worsening issue.  Weight today is 283 pounds with BMI 44.  Obesity is complicated by prediabetes, hyperlipidemia, and hypertension.  She has tried lifestyle interventions including dieting, calorie counting using the my fitness pal, and exercising.  She has found very limited benefits with these lifestyle modifications.  We talked about medication assisted treatment.  No contraindications to using a GLP-1 agonist.  Her mother has a history of some type of thyroid  disease, she is going to clarify for me that this was not medullary thyroid  cancer.  We talked about the benefits and risks of a GLP-1 agonists.  I think she is an excellent candidate for GLP-1 agonist.  I will continue seeing her every 2-3 months for a medically monitored weight loss program and she will continue with lifestyle modifications.  I prescribed Wegovy  0.25 mg to be taken once weekly and we talked about expected side  effects of nausea and decreased appetite.      Relevant Medications   semaglutide -weight management (WEGOVY ) 0.25 MG/0.5ML SOAJ SQ injection   Essential hypertension (Chronic)   Blood pressure elevated today but better on recheck.  Currently 149/97.  She reports good adherence with amlodipine , olmesartan , and HCTZ.  I am hopeful that with starting a GLP-1 agonists we will see weight loss which will improve her hypertension.  She will follow-up with me in 2 months.  I recommended more at home blood pressure monitoring as well.  If blood pressure continues to be this elevated, may need to consider starting spironolactone.        Medium     Prediabetes (Chronic)   Chronic and stable.  A1c today 6.0%.  She is at risk for progression to diabetes given obesity and ongoing metabolic syndrome.      Relevant Orders   POCT glycosylated hemoglobin (Hb A1C) (Completed)   Hypothyroidism (Chronic)   There is a family history of thyroid  disorders, most likely goiter based on her description, and her thyroid  is slightly enlarged. A previous CT in 2019 of the neck showed a normal thyroid .  Confirm family history due to potential risk with GLP-1 agonist.       Other Visit Diagnoses       Screening for colon cancer       Relevant Orders   Ambulatory referral to Gastroenterology     Encounter for screening for malignant neoplasm of breast, unspecified screening modality       Relevant Orders   MM 3D SCREENING MAMMOGRAM BILATERAL BREAST     Immunization due       Relevant Orders   Pneumococcal conjugate vaccine 20-valent (Prevnar 20)   Tdap vaccine greater than or equal to 7yo IM       Cleatus Debby Specking, MD [facility]   "

## 2024-05-28 NOTE — Assessment & Plan Note (Signed)
 Chronic and stable.  A1c today 6.0%.  She is at risk for progression to diabetes given obesity and ongoing metabolic syndrome.

## 2024-05-28 NOTE — Assessment & Plan Note (Signed)
 Chronic and worsening issue.  Weight today is 283 pounds with BMI 44.  Obesity is complicated by prediabetes, hyperlipidemia, and hypertension.  She has tried lifestyle interventions including dieting, calorie counting using the my fitness pal, and exercising.  She has found very limited benefits with these lifestyle modifications.  We talked about medication assisted treatment.  No contraindications to using a GLP-1 agonist.  Her mother has a history of some type of thyroid  disease, she is going to clarify for me that this was not medullary thyroid  cancer.  We talked about the benefits and risks of a GLP-1 agonists.  I think she is an excellent candidate for GLP-1 agonist.  I will continue seeing her every 2-3 months for a medically monitored weight loss program and she will continue with lifestyle modifications.  I prescribed Wegovy  0.25 mg to be taken once weekly and we talked about expected side effects of nausea and decreased appetite.

## 2024-05-28 NOTE — Assessment & Plan Note (Signed)
 Blood pressure elevated today but better on recheck.  Currently 149/97.  She reports good adherence with amlodipine , olmesartan , and HCTZ.  I am hopeful that with starting a GLP-1 agonists we will see weight loss which will improve her hypertension.  She will follow-up with me in 2 months.  I recommended more at home blood pressure monitoring as well.  If blood pressure continues to be this elevated, may need to consider starting spironolactone.

## 2024-06-04 ENCOUNTER — Other Ambulatory Visit

## 2024-06-04 ENCOUNTER — Encounter

## 2024-06-05 ENCOUNTER — Other Ambulatory Visit
